# Patient Record
Sex: Male | Born: 1955
Health system: Southern US, Community
[De-identification: ages and names within clinical notes are randomized; demographics above are authoritative.]

## PROBLEM LIST (undated history)

## (undated) DIAGNOSIS — I10 Essential (primary) hypertension: Secondary | ICD-10-CM

## (undated) DIAGNOSIS — E119 Type 2 diabetes mellitus without complications: Secondary | ICD-10-CM

## (undated) DIAGNOSIS — R7989 Other specified abnormal findings of blood chemistry: Secondary | ICD-10-CM

## (undated) DIAGNOSIS — B029 Zoster without complications: Secondary | ICD-10-CM

## (undated) DIAGNOSIS — E039 Hypothyroidism, unspecified: Secondary | ICD-10-CM

## (undated) DIAGNOSIS — M1990 Unspecified osteoarthritis, unspecified site: Secondary | ICD-10-CM

## (undated) DIAGNOSIS — R55 Syncope and collapse: Secondary | ICD-10-CM

## (undated) DIAGNOSIS — K219 Gastro-esophageal reflux disease without esophagitis: Secondary | ICD-10-CM

## (undated) DIAGNOSIS — K759 Inflammatory liver disease, unspecified: Secondary | ICD-10-CM

## (undated) HISTORY — DX: Other specified abnormal findings of blood chemistry: R79.89

## (undated) HISTORY — PX: WISDOM TOOTH EXTRACTION: SHX21

## (undated) HISTORY — DX: Zoster without complications: B02.9

## (undated) HISTORY — PX: JOINT REPLACEMENT: SHX530

---
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
## 1996-06-06 HISTORY — PX: CERVICAL DISC SURGERY: SHX588

## 2005-06-06 DIAGNOSIS — K5790 Diverticulosis of intestine, part unspecified, without perforation or abscess without bleeding: Secondary | ICD-10-CM

## 2005-06-06 HISTORY — DX: Diverticulosis of intestine, part unspecified, without perforation or abscess without bleeding: K57.90

## 2005-09-02 ENCOUNTER — Ambulatory Visit: Payer: Self-pay | Admitting: Internal Medicine

## 2005-09-07 ENCOUNTER — Ambulatory Visit: Payer: Self-pay | Admitting: Family Medicine

## 2005-09-08 ENCOUNTER — Encounter (INDEPENDENT_AMBULATORY_CARE_PROVIDER_SITE_OTHER): Payer: Self-pay | Admitting: Specialist

## 2005-09-08 ENCOUNTER — Ambulatory Visit: Payer: Self-pay | Admitting: Internal Medicine

## 2005-09-16 ENCOUNTER — Ambulatory Visit: Payer: Self-pay | Admitting: Family Medicine

## 2005-12-23 ENCOUNTER — Ambulatory Visit: Payer: Self-pay | Admitting: Family Medicine

## 2005-12-23 ENCOUNTER — Encounter: Admission: RE | Admit: 2005-12-23 | Discharge: 2005-12-23 | Payer: Self-pay | Admitting: Family Medicine

## 2005-12-27 ENCOUNTER — Ambulatory Visit: Payer: Self-pay | Admitting: *Deleted

## 2008-07-29 ENCOUNTER — Telehealth: Payer: Self-pay | Admitting: Family Medicine

## 2008-10-27 ENCOUNTER — Ambulatory Visit: Payer: Self-pay | Admitting: Family Medicine

## 2008-10-27 DIAGNOSIS — M6580 Other synovitis and tenosynovitis, unspecified site: Secondary | ICD-10-CM

## 2008-11-28 ENCOUNTER — Ambulatory Visit: Payer: Self-pay | Admitting: Family Medicine

## 2008-11-28 LAB — CONVERTED CEMR LAB
ALT: 38 units/L (ref 0–53)
AST: 28 units/L (ref 0–37)
Albumin: 4 g/dL (ref 3.5–5.2)
Alkaline Phosphatase: 69 units/L (ref 39–117)
BUN: 14 mg/dL (ref 6–23)
Basophils Absolute: 0 10*3/uL (ref 0.0–0.1)
Cholesterol: 144 mg/dL (ref 0–200)
Creatinine, Ser: 0.9 mg/dL (ref 0.4–1.5)
Eosinophils Absolute: 0.3 10*3/uL (ref 0.0–0.7)
Eosinophils Relative: 2.7 % (ref 0.0–5.0)
GFR calc non Af Amer: 93.28 mL/min (ref >60)
Glucose, Bld: 106 mg/dL — ABNORMAL HIGH (ref 70–99)
HCT: 46.5 % (ref 39.0–52.0)
Lymphs Abs: 2.6 10*3/uL (ref 0.7–4.0)
MCV: 87.8 fL (ref 78.0–100.0)
Monocytes Relative: 7.1 % (ref 3.0–12.0)
Neutrophils Relative %: 62 % (ref 43.0–77.0)
PSA: 1.08 ng/mL (ref 0.10–4.00)
Potassium: 4.6 meq/L (ref 3.5–5.1)
Sodium: 141 meq/L (ref 135–145)
Specific Gravity, Urine: 1.02
Total Bilirubin: 1 mg/dL (ref 0.3–1.2)
Total CHOL/HDL Ratio: 5
Total Protein: 6.6 g/dL (ref 6.0–8.3)
Urobilinogen, UA: 0.2
WBC: 9.3 10*3/uL (ref 4.5–10.5)
pH: 7.5

## 2008-12-03 ENCOUNTER — Ambulatory Visit: Payer: Self-pay | Admitting: Family Medicine

## 2008-12-03 DIAGNOSIS — F17200 Nicotine dependence, unspecified, uncomplicated: Secondary | ICD-10-CM

## 2008-12-03 DIAGNOSIS — K210 Gastro-esophageal reflux disease with esophagitis: Secondary | ICD-10-CM

## 2008-12-17 ENCOUNTER — Telehealth: Payer: Self-pay | Admitting: Family Medicine

## 2009-06-06 DIAGNOSIS — L309 Dermatitis, unspecified: Secondary | ICD-10-CM

## 2009-06-06 DIAGNOSIS — L729 Follicular cyst of the skin and subcutaneous tissue, unspecified: Secondary | ICD-10-CM

## 2009-06-06 HISTORY — DX: Dermatitis, unspecified: L30.9

## 2009-06-06 HISTORY — DX: Follicular cyst of the skin and subcutaneous tissue, unspecified: L72.9

## 2009-11-27 ENCOUNTER — Ambulatory Visit: Payer: Self-pay | Admitting: Family Medicine

## 2009-11-27 DIAGNOSIS — L2089 Other atopic dermatitis: Secondary | ICD-10-CM

## 2009-12-11 ENCOUNTER — Ambulatory Visit: Payer: Self-pay | Admitting: Family Medicine

## 2009-12-11 LAB — CONVERTED CEMR LAB
ALT: 30 units/L (ref 0–53)
Alkaline Phosphatase: 60 units/L (ref 39–117)
BUN: 18 mg/dL (ref 6–23)
Basophils Relative: 0.4 % (ref 0.0–3.0)
Bilirubin, Direct: 0.2 mg/dL (ref 0.0–0.3)
Blood in Urine, dipstick: NEGATIVE
CO2: 26 meq/L (ref 19–32)
Chloride: 111 meq/L (ref 96–112)
Cholesterol: 169 mg/dL (ref 0–200)
Eosinophils Relative: 2.9 % (ref 0.0–5.0)
Glucose, Urine, Semiquant: NEGATIVE
HCT: 49.6 % (ref 39.0–52.0)
HDL: 32.8 mg/dL — ABNORMAL LOW (ref >39.00)
Ketones, urine, test strip: NEGATIVE
Lymphs Abs: 3.1 10*3/uL (ref 0.7–4.0)
MCHC: 34.2 g/dL (ref 30.0–36.0)
MCV: 90.8 fL (ref 78.0–100.0)
Monocytes Absolute: 0.8 10*3/uL (ref 0.1–1.0)
Neutro Abs: 6.5 10*3/uL (ref 1.4–7.7)
Nitrite: NEGATIVE
PSA: 0.97 ng/mL (ref 0.10–4.00)
Sodium: 143 meq/L (ref 135–145)
TSH: 4.41 microintl units/mL (ref 0.35–5.50)
Total Protein: 7.2 g/dL (ref 6.0–8.3)
Urobilinogen, UA: 0.2
VLDL: 32.2 mg/dL (ref 0.0–40.0)
WBC: 10.7 10*3/uL — ABNORMAL HIGH (ref 4.5–10.5)
pH: 5.5

## 2009-12-18 ENCOUNTER — Ambulatory Visit: Payer: Self-pay | Admitting: Family Medicine

## 2009-12-24 ENCOUNTER — Ambulatory Visit: Payer: Self-pay | Admitting: Family Medicine

## 2009-12-25 DIAGNOSIS — B079 Viral wart, unspecified: Secondary | ICD-10-CM

## 2010-05-10 ENCOUNTER — Ambulatory Visit: Payer: Self-pay | Admitting: Family Medicine

## 2010-05-10 DIAGNOSIS — L723 Sebaceous cyst: Secondary | ICD-10-CM

## 2010-07-06 NOTE — Assessment & Plan Note (Signed)
Summary: EVAL OF GROWTH ON BACK // RS   Vital Signs:  Patient profile:   55 year old male Weight:      238 pounds Temp:     97.6 degrees F oral BP sitting:   110 / 80  (left arm) Cuff size:   regular  Vitals Entered By: Kern Reap CMA Duncan Dull) (May 10, 2010 3:38 PM) CC: lump on upper right back Is Patient Diabetic? No Pain Assessment Patient in pain? no        CC:  lump on upper right back.  History of Present Illness: Hadi is a 55 year old male, who comes in today for evaluation of a lump on his back.  He himself has never noticed this pain.  He recently had a massage, and the massage therapist noted.he himself is asymptomatic  Allergies: 1)  ! Codeine  Past History:  Past medical, surgical, family and social histories (including risk factors) reviewed for relevance to current acute and chronic problems.  Past Medical History: Reviewed history from 01/22/2007 and no changes required. Hepatitis A  Past Surgical History: Reviewed history from 01/22/2007 and no changes required. T&A Appendectomy  Family History: Reviewed history from 01/22/2007 and no changes required. Family History Psychiatric care Family History of Cardiovascular disorder  Social History: Reviewed history from 12/03/2008 and no changes required. Occupation:elementary school teacher Married Alcohol use-yes Current Smoker  Review of Systems      See HPI  Physical Exam  General:  Well-developed,well-nourished,in no acute distress; alert,appropriate and cooperative throughout examination   Problems:  Medical Problems Added: 1)  Dx of Sebaceous Cyst  (ICD-706.2)  Impression & Recommendations:  Problem # 1:  SEBACEOUS CYST (ICD-706.2) Assessment New  Complete Medication List: 1)  Epipen 2-pak 0.3 Mg/0.53ml (1:1000) Devi (Epinephrine hcl (anaphylaxis)) .... Uad 2)  Nexium 20 Mg Cpdr (Esomeprazole magnesium) .... Take 1 tablet by mouth two times a day 3)  Chantix Starting  Month Pak 0.5 Mg X 11 & 1 Mg X 42 Tabs (Varenicline tartrate) .... Uad 4)  Chantix Continuing Month Pak 1 Mg Tabs (Varenicline tartrate) .... Uad 5)  Triamcinolone Acetonide 0.5 % Oint (Triamcinolone acetonide) .... Apply 3 x day  Patient Instructions: 1)  return if he gets infected   Orders Added: 1)  Est. Patient Level III [81191]

## 2010-07-06 NOTE — Miscellaneous (Signed)
Summary: Consent for Mole Removal   Consent for Mole Removal   Imported By: Maryln Gottron 12/25/2009 13:34:44  _____________________________________________________________________  External Attachment:    Type:   Image     Comment:   External Document

## 2010-07-06 NOTE — Assessment & Plan Note (Signed)
Summary: lesion removal from face/njr   Procedure Note Last Tetanus: Historical (06/06/2000)  Mole Biopsy/Removal: Indication: inflamed lesion Consent signed: yes  Procedure # 1: elliptical incision with 2 mm margin    Size (in cm): 0.5 x 0.5    Region: anterior    Location: face    Instrument used: #15 blade    Anesthesia: 1% lidocaine w/epinephrine    Closure: cauterry  Cleaned and prepped with: alcohol Wound dressing: neosporin and bandaid   History of Present Illness: [name] is a 55 year old, married male, who comes in today for removal of irritated lesion on his left upper cheek  Allergies: 1)  ! Codeine   Complete Medication List: 1)  Epipen 2-pak 0.3 Mg/0.5ml (1:1000) Devi (Epinephrine hcl (anaphylaxis)) .... Uad 2)  Nexium 20 Mg Cpdr (Esomeprazole magnesium) .... Take 1 tablet by mouth two times a day 3)  Chantix Starting Month Pak 0.5 Mg X 11 & 1 Mg X 42 Tabs (Varenicline tartrate) .... Uad 4)  Chantix Continuing Month Pak 1 Mg Tabs (Varenicline tartrate) .... Uad 5)  Triamcinolone Acetonide 0.5 % Oint (Triamcinolone acetonide) .... Apply 3 x day  Other Orders: Shave Skin Lesion 0.6-1.0cm face/ears/eyelids/nose/lips/mm (11311)

## 2010-07-06 NOTE — Assessment & Plan Note (Signed)
Summary: RASH ON LOWER LEGS // RS   Vital Signs:  Patient profile:   55 year old male Weight:      232 pounds BMI:     34.89 Temp:     98.3 degrees F oral BP sitting:   120 / 80  (left arm) Cuff size:   regular  Vitals Entered By: Kern Reap CMA Duncan Dull) (November 27, 2009 12:10 PM) CC: rash on legs   CC:  rash on legs.  History of Present Illness: [name] is a 55 year old male, who comes in today for evaluation of a skin rash x 2 months.  He developed.  The lesions over both lower extremities two months ago.  He's been using an anti-fungal cream to no avail.  Allergies: 1)  ! Codeine  Physical Exam  General:  Well-developed,well-nourished,in no acute distress; alert,appropriate and cooperative throughout examination Skin:  lesions lower extremities, consistent with the psychotic eczema   Problems:  Medical Problems Added: 1)  Dx of Eczema, Atopic  (ICD-691.8)  Impression & Recommendations:  Problem # 1:  ECZEMA, ATOPIC (ICD-691.8) Assessment New  His updated medication list for this problem includes:    Triamcinolone Acetonide 0.5 % Oint (Triamcinolone acetonide) .Marland Kitchen... Apply 3 x day  Orders: Prescription Created Electronically [phone])  Complete Medication List: 1)  Epipen 2-pak 0.3 Mg/0.79ml (1:1000) Devi (Epinephrine hcl (anaphylaxis)) .... Uad 2)  Nexium 20 Mg Cpdr (Esomeprazole magnesium) .... Take 1 tablet by mouth two times a day 3)  Chantix Starting Month Pak 0.5 Mg X 11 & 1 Mg X 42 Tabs (Varenicline tartrate) .... Uad 4)  Chantix Continuing Month Pak 1 Mg Tabs (Varenicline tartrate) .... Uad 5)  Triamcinolone Acetonide 0.5 % Oint (Triamcinolone acetonide) .... Apply 3 x day  Patient Instructions: 1)  applying small amounts of triamcinolone ointment 3 times daily until rash clears Prescriptions: TRIAMCINOLONE ACETONIDE 0.5 % OINT (TRIAMCINOLONE ACETONIDE) apply 3 x day  #60 gr x 4   Entered and Authorized by:   Roderick Pee MD   Signed by:   Roderick Pee MD on 11/27/2009   Method used:   Electronically to        [facility]* (retail)       [address]       [place], [place]  [address]       Ph: [phone]       Fax: [phone]   RxID:   [id number]

## 2010-07-06 NOTE — Assessment & Plan Note (Signed)
Summary: cpx//ccm   Vital Signs:  Patient profile:   55 year old male Height:      68.25 inches Weight:      230 pounds Temp:     98.1 degrees F oral BP sitting:   110 / 80  (left arm) Cuff size:   regular  Vitals Entered By: Kern Reap CMA Duncan Dull) (December 18, 2009 10:39 AM) CC: cpx   CC:  cpx.  History of Present Illness: [name] is a 55 year old, married male, smoker, who comes in today for evaluation of smoking, reflux esophagitis, eczema.  Last year.  He agreed to try the smoking cessation program with the chantix however, he never got the medication filled.  Will refill medication and try again this year.  Yours keeps an EpiPen with him.  He's had a history of anaphylactic reaction from bee stings.  He states he keeps his EpiPen in the glove compartment of his car.  I explained and the heat will denature  the medication keep it with them not in the car  He also has severe eczema is currently using triamcinolone .53, times a day, it's about 95% improved on his lower extremity.  He also takes Nexium 20 mg b.i.d. for reflux.  Most of which I told him would probably go away to quit smoking...he also has bilateral hearing loss with hearing aids that he only wears in the classroom  He gets routine eye care.  Dental care.  Colonoscopy normal when he turned  48.  Tetanus 2002  Allergies: 1)  ! Codeine  Past History:  Past medical, surgical, family and social histories (including risk factors) reviewed, and no changes noted (except as noted below).  Past Medical History: Reviewed history from 01/22/2007 and no changes required. Hepatitis A  Past Surgical History: Reviewed history from 01/22/2007 and no changes required. T&A Appendectomy  Family History: Reviewed history from 01/22/2007 and no changes required. Family History Psychiatric care Family History of Cardiovascular disorder  Social History: Reviewed history from 12/03/2008 and no changes  required. Occupation:elementary school teacher Married Alcohol use-yes Current Smoker  Review of Systems      See HPI  Physical Exam  General:  Well-developed,well-nourished,in no acute distress; alert,appropriate and cooperative throughout examination Head:  Normocephalic and atraumatic without obvious abnormalities. No apparent alopecia or balding. Eyes:  No corneal or conjunctival inflammation noted. EOMI. Perrla. Funduscopic exam benign, without hemorrhages, exudates or papilledema. Vision grossly normal. Ears:  External ear exam shows no significant lesions or deformities.  Otoscopic examination reveals clear canals, tympanic membranes are intact bilaterally without bulging, retraction, inflammation or discharge. Hearing is grossly normal bilaterally. Nose:  External nasal examination shows no deformity or inflammation. Nasal mucosa are pink and moist without lesions or exudates. Mouth:  Oral mucosa and oropharynx without lesions or exudates.  Teeth in good repair. Neck:  No deformities, masses, or tenderness noted. Chest Wall:  No deformities, masses, tenderness or gynecomastia noted. Breasts:  No masses or gynecomastia noted Lungs:  Normal respiratory effort, chest expands symmetrically. Lungs are clear to auscultation, no crackles or wheezes. Heart:  Normal rate and regular rhythm. S1 and S2 normal without gallop, murmur, click, rub or other extra sounds. Abdomen:  Bowel sounds positive,abdomen soft and non-tender without masses, organomegaly or hernias noted. Rectal:  No external abnormalities noted. Normal sphincter tone. No rectal masses or tenderness. Genitalia:  Testes bilaterally descended without nodularity, tenderness or masses. No scrotal masses or lesions. No penis lesions or urethral discharge. Prostate:  Prostate  gland firm and smooth, no enlargement, nodularity, tenderness, mass, asymmetry or induration. Msk:  No deformity or scoliosis noted of thoracic or lumbar spine.    Pulses:  R and L carotid,radial,femoral,dorsalis pedis and posterior tibial pulses are full and equal bilaterally Extremities:  No clubbing, cyanosis, edema, or deformity noted with normal full range of motion of all joints.   Neurologic:  No cranial nerve deficits noted. Station and gait are normal. Plantar reflexes are down-going bilaterally. DTRs are symmetrical throughout. Sensory, motor and coordinative functions appear intact. Skin:  total body skin exam normal except for a lesion on his left face.  Return for removal Cervical Nodes:  No lymphadenopathy noted Axillary Nodes:  No palpable lymphadenopathy Inguinal Nodes:  No significant adenopathy Psych:  Cognition and judgment appear intact. Alert and cooperative with normal attention span and concentration. No apparent delusions, illusions, hallucinations   Impression & Recommendations:  Problem # 1:  Preventive Health Care (ICD-V70.0) Assessment Unchanged  Problem # 2:  ECZEMA, ATOPIC (ICD-691.8) Assessment: Improved  His updated medication list for this problem includes:    Triamcinolone Acetonide 0.5 % Oint (Triamcinolone acetonide) .Marland Kitchen... Apply 3 x day  Orders: Prescription Created Electronically [id number])  Problem # 3:  TOBACCO ABUSE (ICD-305.1) Assessment: Unchanged  His updated medication list for this problem includes:    Chantix Starting Month Pak 0.5 Mg X 11 & 1 Mg X 42 Tabs (Varenicline tartrate) ..... Uad    Chantix Continuing Month Pak 1 Mg Tabs (Varenicline tartrate) ..... Uad  Orders: Prescription Created Electronically [phone])  Problem # 4:  ESOPHAGITIS, REFLUX (ICD-530.11) Assessment: Improved  His updated medication list for this problem includes:    Nexium 20 Mg Cpdr (Esomeprazole magnesium) .Marland Kitchen... Take 1 tablet by mouth two times a day  Orders: Prescription Created Electronically [id number])  Complete Medication List: 1)  Epipen 2-pak 0.3 Mg/0.90ml (1:1000) Devi (Epinephrine hcl (anaphylaxis)) ....  Uad 2)  Nexium 20 Mg Cpdr (Esomeprazole magnesium) .... Take 1 tablet by mouth two times a day 3)  Chantix Starting Month Pak 0.5 Mg X 11 & 1 Mg X 42 Tabs (Varenicline tartrate) .... Uad 4)  Chantix Continuing Month Pak 1 Mg Tabs (Varenicline tartrate) .... Uad 5)  Triamcinolone Acetonide 0.5 % Oint (Triamcinolone acetonide) .... Apply 3 x day  Patient Instructions: 1)  begin the smoking cessation program with chantix when you get to the blue tablets, take a half a tablet twice daily instead of a full tablet twice daily.  Return in 4 weeks for follow-up.  Be sure to call the number on the information packet 2)  return next week to remove the lesion from your face Prescriptions: CHANTIX CONTINUING MONTH PAK 1 MG TABS (VARENICLINE TARTRATE) UAD  #1 x 3   Entered and Authorized by:   Roderick Pee MD   Signed by:   Roderick Pee MD on 12/18/2009   Method used:   Print then Give to Patient   RxID:   [id number] CHANTIX STARTING MONTH PAK 0.5 MG X 11 & 1 MG X 42 TABS (VARENICLINE TARTRATE) UAD  #1 x 0   Entered and Authorized by:   Roderick Pee MD   Signed by:   Roderick Pee MD on 12/18/2009   Method used:   Print then Give to Patient   RxID:   [id number] TRIAMCINOLONE ACETONIDE 0.5 % OINT (TRIAMCINOLONE ACETONIDE) apply 3 x day  #60 gr x 4   Entered and Authorized by:   Roderick Pee  MD   Signed by:   Roderick Pee MD on 12/18/2009   Method used:   Electronically to        [facility]  [address] [phone]* (retail)       [address]       [place], [place]  [address]       Ph: [phone] or [phone]       Fax: [phone]   RxID:   [id number] CHANTIX CONTINUING MONTH PAK 1 MG TABS (VARENICLINE TARTRATE) UAD  #1 x 3   Entered and Authorized by:   Roderick Pee MD   Signed by:   Roderick Pee MD on 12/18/2009   Method used:   Electronically to        [facility] [phone]* (retail)       [address]        [place], [place]  [address]       Ph: [phone] or [phone]       Fax: [phone]   RxID:   [id number] CHANTIX STARTING MONTH PAK 0.5 MG X 11 & 1 MG X 42 TABS (VARENICLINE TARTRATE) UAD  #1 x 0   Entered and Authorized by:   Roderick Pee MD   Signed by:   Roderick Pee MD on 12/18/2009   Method used:   Electronically to        [facility]  [address] [phone]* (retail)       [address]       [name], [place]  [address]       Ph: [phone] or [phone]       Fax: [phone]   RxID:   [id number] NEXIUM 20 MG CPDR (ESOMEPRAZOLE MAGNESIUM) Take 1 tablet by mouth two times a day  #200 x 3   Entered and Authorized by:   Roderick Pee MD   Signed by:   Roderick Pee MD on 12/18/2009   Method used:   Electronically to        [facility]  [address] [phone]* (retail)       [address]       [place], [place]  [address]       Ph: [phone] or [phone]       Fax: [phone]   RxID:   [id number] EPIPEN 2-PAK 0.3 MG/0.3ML (1:1000) DEVI (EPINEPHRINE HCL (ANAPHYLAXIS)) UAD  #1 x 2   Entered and Authorized by:   Roderick Pee MD   Signed by:   Roderick Pee MD on 12/18/2009   Method used:   [facility]ronically to        [facility]  [address] [phone]* (retail)       [address]       [place], [place]  [address]       Ph: [phone] or [phone]       Fax: [phone]   RxID:   [id number]     Appended Document: cpx//ccm     Allergies: 1)  ! Codeine   Complete Medication List: 1)  Epipen 2-pak 0.3 Mg/0.77ml (1:1000) Devi (Epinephrine hcl (anaphylaxis)) .... Uad 2)  Nexium 20 Mg Cpdr (Esomeprazole magnesium) .... Take 1 tablet by mouth two times a day 3)  Chantix Starting Month  Pak 0.5 Mg X 11 & 1 Mg X 42 Tabs (Varenicline tartrate) .... Uad 4)  Chantix Continuing Month Pak 1 Mg Tabs (Varenicline tartrate) .... Uad 5)  Triamcinolone Acetonide 0.5 % Oint (Triamcinolone acetonide) .... Apply 3 x  day  Other Orders: EKG w/ Interpretation (93000)

## 2010-11-16 ENCOUNTER — Ambulatory Visit (INDEPENDENT_AMBULATORY_CARE_PROVIDER_SITE_OTHER): Payer: BC Managed Care – PPO | Admitting: Internal Medicine

## 2010-11-16 ENCOUNTER — Encounter: Payer: Self-pay | Admitting: Internal Medicine

## 2010-11-16 ENCOUNTER — Ambulatory Visit (INDEPENDENT_AMBULATORY_CARE_PROVIDER_SITE_OTHER)
Admission: RE | Admit: 2010-11-16 | Discharge: 2010-11-16 | Disposition: A | Discharge status: Home / Self Care | Payer: BC Managed Care – PPO | Source: Ambulatory Visit | Attending: Internal Medicine | Admitting: Internal Medicine

## 2010-11-16 VITALS — BP 118/70 | HR 96 | Ht 68.0 in | Wt 240.0 lb

## 2010-11-16 DIAGNOSIS — R109 Unspecified abdominal pain: Secondary | ICD-10-CM

## 2010-11-16 LAB — POCT URINALYSIS DIPSTICK
Blood, UA: NEGATIVE
Leukocytes, UA: NEGATIVE
Nitrite, UA: NEGATIVE
Spec Grav, UA: 1.025
pH, UA: 5

## 2010-11-16 LAB — CBC WITH DIFFERENTIAL/PLATELET
Basophils Relative: 0.2 % (ref 0.0–3.0)
Eosinophils Absolute: 0.3 10*3/uL (ref 0.0–0.7)
HCT: 48.8 % (ref 39.0–52.0)
Hemoglobin: 16.7 g/dL (ref 13.0–17.0)
Lymphocytes Relative: 16.6 % (ref 12.0–46.0)
Monocytes Absolute: 1.1 10*3/uL — ABNORMAL HIGH (ref 0.1–1.0)
Monocytes Relative: 6.9 % (ref 3.0–12.0)
Neutro Abs: 12.2 10*3/uL — ABNORMAL HIGH (ref 1.4–7.7)
Neutrophils Relative %: 74.7 % (ref 43.0–77.0)
Platelets: 214 10*3/uL (ref 150.0–400.0)
RDW: 13.4 % (ref 11.5–14.6)

## 2010-11-16 MED ORDER — LEVOFLOXACIN 750 MG PO TABS: 750.0000 mg | ORAL_TABLET | Freq: Every day | ORAL | Status: DC

## 2010-11-16 NOTE — Progress Notes (Signed)
  Subjective:    Patient ID: [name], male    DOB: 1955/09/16, 55 y.o.   MRN: [id number]  HPI Pt presents to clinic for evaluation of abdominal pain. Notes 6d h/o intermittent lower abd pain without radiation. Pain is intermittently severe and may last up to 20 mins followed by spontaneous resolution. Describes pain sometimes as crampy sensation associated with gas. No fever/chills, n/v or blood in stool. Bowel movements may be slightly more loose. No obvious exacerbating or alleviating factors. Colonoscopy reportedly nl 2005 but without report cannot excluded diverticuli. No other complaints.  Reviewed pmh, medications and allergies.    Review of Systems  Constitutional: Negative for fever, chills and diaphoresis.  Gastrointestinal: Positive for abdominal pain. Negative for nausea, vomiting, diarrhea, constipation, blood in stool, abdominal distention and rectal pain.  Genitourinary: Negative for dysuria, hematuria and difficulty urinating.  Skin: Negative for color change and rash.       Objective:   Physical Exam  Nursing note and vitals reviewed. Constitutional: He appears well-developed and well-nourished. No distress.  HENT:  Head: Normocephalic and atraumatic.  Right Ear: External ear normal.  Left Ear: External ear normal.  Nose: Nose normal.  Mouth/Throat: No oropharyngeal exudate.  Eyes: Conjunctivae are normal. No scleral icterus.  Neck: Neck supple.  Abdominal: Soft. Bowel sounds are normal. He exhibits no distension and no mass. There is no hepatosplenomegaly. There is tenderness in the suprapubic area and left lower quadrant. There is no rigidity, no rebound and no guarding.  Neurological: He is alert.  Skin: Skin is warm and dry. No rash noted. He is not diaphoretic. No erythema.  Psychiatric: He has a normal mood and affect.          Assessment & Plan:

## 2010-11-16 NOTE — Assessment & Plan Note (Signed)
Obtain CBC, UA and abdominal xray. Begin empiric abx tx for possible diverticulitis. Close followup if no improvement or worsening.

## 2010-11-17 ENCOUNTER — Telehealth: Payer: Self-pay

## 2010-11-17 NOTE — Telephone Encounter (Signed)
Pt notified and appointment scheduled.

## 2010-11-17 NOTE — Telephone Encounter (Signed)
Message copied by Beverely Low on Wed Nov 17, 2010  3:20 PM ------      Message from: Staci Righter      Created: Tue Nov 16, 2010  9:16 PM       abd xray ok. CBC shows elevated WBC. Make sure taking abx and pls schedule close followup with me 2-3 days

## 2010-11-17 NOTE — Telephone Encounter (Signed)
Left message for pt to call back  °

## 2010-11-17 NOTE — Telephone Encounter (Signed)
Message copied by Beverely Low on Wed Nov 17, 2010  2:35 PM ------      Message from: Staci Righter      Created: Tue Nov 16, 2010  9:16 PM       abd xray ok. CBC shows elevated WBC. Make sure taking abx and pls schedule close followup with me 2-3 days

## 2010-11-19 ENCOUNTER — Encounter: Payer: Self-pay | Admitting: Internal Medicine

## 2010-11-19 ENCOUNTER — Ambulatory Visit (INDEPENDENT_AMBULATORY_CARE_PROVIDER_SITE_OTHER): Payer: BC Managed Care – PPO | Admitting: Internal Medicine

## 2010-11-19 VITALS — BP 120/74 | HR 96

## 2010-11-19 DIAGNOSIS — D72829 Elevated white blood cell count, unspecified: Secondary | ICD-10-CM

## 2010-11-19 DIAGNOSIS — R21 Rash and other nonspecific skin eruption: Secondary | ICD-10-CM

## 2010-11-19 DIAGNOSIS — R109 Unspecified abdominal pain: Secondary | ICD-10-CM

## 2010-11-19 LAB — CBC WITH DIFFERENTIAL/PLATELET
Basophils Absolute: 0 10*3/uL (ref 0.0–0.1)
Basophils Relative: 0.3 % (ref 0.0–3.0)
Eosinophils Absolute: 0.3 10*3/uL (ref 0.0–0.7)
MCHC: 34.2 g/dL (ref 30.0–36.0)
MCV: 88.5 fl (ref 78.0–100.0)
Monocytes Relative: 7.6 % (ref 3.0–12.0)

## 2010-11-19 MED ORDER — LEVOFLOXACIN 750 MG PO TABS: 750.0000 mg | ORAL_TABLET | Freq: Every day | ORAL | Status: AC

## 2010-11-19 MED ORDER — FLUOCINONIDE 0.05 % EX CREA
TOPICAL_CREAM | CUTANEOUS | Status: AC
Start: 2010-11-19 — End: 2011-11-19

## 2010-11-19 NOTE — Assessment & Plan Note (Signed)
Improving with empiric antibiotic therapy. Associated leukocytosis. Consider diverticulitis. Extend course of antibiotic times additional five days. Repeat CBC. Follow closely if no improvement or worsening.

## 2010-11-19 NOTE — Assessment & Plan Note (Signed)
Change triamcinolone to Lidex. Consider dermatology consult if remains refractory to treatment

## 2010-11-19 NOTE — Progress Notes (Signed)
  Subjective:    Patient ID: [name], male    DOB: [birth year], 55 y.o.   MRN: [id number]  HPI Pt presents to clinic for followup of abdominal pain and leukocytosis. Recently seen with predominantly left lower quadrant abdominal pain and tenderness. Placed on empiric Levaquin and tolerating without side effects. Feels pain is approximately 80% better. Denies fever chills nausea vomiting or diarrhea. Reviewed recent CBC with leukocytosis of sixteen thousand with neutrophil predominance. Also notes chronic one-year history of left calf ? Eczema unsuccessfully treated with triamcinolone once a day. No alleviating or exacerbating factors. Area is not spreading. No other complaints.  Reviewed past medical history, medications and allergies.  Review of Systems see history of present illness     Objective:   Physical Exam  Nursing note and vitals reviewed. Constitutional: He appears well-developed and well-nourished. No distress.  HENT:  Head: Normocephalic and atraumatic.  Right Ear: External ear normal.  Left Ear: External ear normal.  Nose: Nose normal.  Eyes: Conjunctivae are normal. No scleral icterus.  Abdominal: Soft. Bowel sounds are normal. He exhibits no distension and no mass. There is tenderness. There is no rebound and no guarding.       Minimal tenderness left lower quadrant. Much improved. No rebound guarding or rigidity. Positive bowel sounds with no mass.  Skin: Skin is warm and dry. Rash noted. He is not diaphoretic.       Left calf with mild erythematous raised circular ? rash  Psychiatric: He has a normal mood and affect.          Assessment & Plan:

## 2010-11-22 ENCOUNTER — Telehealth: Payer: Self-pay

## 2010-11-22 NOTE — Telephone Encounter (Signed)
Message copied by Beverely Low on Mon Nov 22, 2010  4:23 PM ------      Message from: Staci Righter      Created: Sun Nov 21, 2010 11:49 AM       Wbc improving. Continue abx

## 2010-11-22 NOTE — Telephone Encounter (Signed)
Message copied by Beverely Low on Mon Nov 22, 2010  3:27 PM ------      Message from: Staci Righter      Created: Sun Nov 21, 2010 11:49 AM       Wbc improving. Continue abx

## 2010-11-22 NOTE — Telephone Encounter (Signed)
Pt.notified

## 2010-11-22 NOTE — Telephone Encounter (Signed)
Left message for pt to call back  °

## 2011-03-06 ENCOUNTER — Other Ambulatory Visit: Payer: Self-pay | Admitting: Family Medicine

## 2011-06-07 HISTORY — DX: Gilbert syndrome: E80.4

## 2011-11-30 ENCOUNTER — Other Ambulatory Visit: Payer: Self-pay | Admitting: Family Medicine

## 2012-03-27 ENCOUNTER — Encounter: Payer: Self-pay | Admitting: Family Medicine

## 2012-03-27 ENCOUNTER — Ambulatory Visit (INDEPENDENT_AMBULATORY_CARE_PROVIDER_SITE_OTHER): Payer: BC Managed Care – PPO | Admitting: Family Medicine

## 2012-03-27 VITALS — BP 110/80 | Temp 98.2°F | Wt 213.0 lb

## 2012-03-27 DIAGNOSIS — IMO0001 Reserved for inherently not codable concepts without codable children: Secondary | ICD-10-CM

## 2012-03-27 DIAGNOSIS — IMO0002 Reserved for concepts with insufficient information to code with codable children: Secondary | ICD-10-CM

## 2012-03-27 DIAGNOSIS — R358 Other polyuria: Secondary | ICD-10-CM

## 2012-03-27 DIAGNOSIS — E1165 Type 2 diabetes mellitus with hyperglycemia: Secondary | ICD-10-CM

## 2012-03-27 DIAGNOSIS — R3589 Other polyuria: Secondary | ICD-10-CM

## 2012-03-27 LAB — POCT URINALYSIS DIPSTICK
Nitrite, UA: NEGATIVE
Urobilinogen, UA: 0.2
pH, UA: 5

## 2012-03-27 MED ORDER — METFORMIN HCL 500 MG PO TABS: 500.0000 mg | ORAL_TABLET | Freq: Two times a day (BID) | ORAL | Status: DC

## 2012-03-27 NOTE — Patient Instructions (Signed)
Stay on a sugar free diet  Metformin 500 mg one tablet he for your noon meal  Fasting blood sugar daily in the morning  Walk 30 minutes daily  Drink 24 ounces of water daily  Followup in one week

## 2012-03-27 NOTE — Progress Notes (Signed)
  Subjective:    Patient ID: [name], male    DOB: 06/15/1955, 56 y.o.   MRN: [id number]  HPI [name] is a 56 year old married male who comes in today with a 6 week history of increased urination nocturia increased thirst urinary difficulties and a 25 pound weight loss  His father was type II diabetic   Review of Systems    general and metabolic review of systems otherwise negative Objective:   Physical Exam  Well-developed well-nourished male in no acute distress random blood sugar 260      Assessment & Plan:  New onset of diabetes type 2 plan discussed diet exercise fluid intake metformin 500 mg daily before noon

## 2012-03-28 LAB — BASIC METABOLIC PANEL
BUN: 15 mg/dL (ref 6–23)
CO2: 28 mEq/L (ref 19–32)
Chloride: 98 mEq/L (ref 96–112)
Creatinine, Ser: 1 mg/dL (ref 0.4–1.5)
GFR: 86.59 mL/min (ref >60.00)

## 2012-03-28 LAB — LIPID PANEL
Cholesterol: 206 mg/dL — ABNORMAL HIGH (ref 0–200)
HDL: 33.9 mg/dL — ABNORMAL LOW (ref >39.00)
Triglycerides: 170 mg/dL — ABNORMAL HIGH (ref 0.0–149.0)
VLDL: 34 mg/dL (ref 0.0–40.0)

## 2012-03-28 LAB — CBC WITH DIFFERENTIAL/PLATELET
Basophils Relative: 0.6 % (ref 0.0–3.0)
Eosinophils Relative: 1 % (ref 0.0–5.0)
HCT: 52.7 % — ABNORMAL HIGH (ref 39.0–52.0)
Hemoglobin: 17.6 g/dL — ABNORMAL HIGH (ref 13.0–17.0)
MCV: 88.8 fl (ref 78.0–100.0)
Monocytes Absolute: 0.9 10*3/uL (ref 0.1–1.0)
Neutrophils Relative %: 62 % (ref 43.0–77.0)
RDW: 13.1 % (ref 11.5–14.6)
WBC: 10.6 10*3/uL — ABNORMAL HIGH (ref 4.5–10.5)

## 2012-03-28 LAB — HEPATIC FUNCTION PANEL
Alkaline Phosphatase: 69 U/L (ref 39–117)
Bilirubin, Direct: 0.4 mg/dL — ABNORMAL HIGH (ref 0.0–0.3)
Total Bilirubin: 1.9 mg/dL — ABNORMAL HIGH (ref 0.3–1.2)
Total Protein: 7.5 g/dL (ref 6.0–8.3)

## 2012-03-28 LAB — MICROALBUMIN / CREATININE URINE RATIO: Microalb Creat Ratio: 5.3 mg/g (ref 0.0–30.0)

## 2012-03-28 LAB — LDL CHOLESTEROL, DIRECT: Direct LDL: 134.2 mg/dL

## 2012-03-30 LAB — PSA: PSA: 0.81 ng/mL (ref 0.10–4.00)

## 2012-04-02 ENCOUNTER — Ambulatory Visit: Payer: BC Managed Care – PPO | Admitting: Family Medicine

## 2012-04-03 ENCOUNTER — Ambulatory Visit (INDEPENDENT_AMBULATORY_CARE_PROVIDER_SITE_OTHER): Payer: BC Managed Care – PPO | Admitting: Family Medicine

## 2012-04-03 ENCOUNTER — Encounter: Payer: Self-pay | Admitting: Family Medicine

## 2012-04-03 VITALS — BP 110/78 | Temp 97.9°F | Wt 215.0 lb

## 2012-04-03 DIAGNOSIS — E1165 Type 2 diabetes mellitus with hyperglycemia: Secondary | ICD-10-CM

## 2012-04-03 NOTE — Progress Notes (Signed)
  Subjective:    Patient ID: [name], male    DOB: 1955/07/06, 56 y.o.   MRN: [id number]  HPI [name] is a 56 year old male who comes in today for followup of new-onset diabetes  His hemoglobin A1c was 13. Blood pressure normal 110/78 and despite his blood sugar being fairly high over 250 his lipids were fairly normal. He also has an elevated bilirubin consistent with mild Guilbert's syndrome Blood sugars at home are coming down this morning fasting was 178  Review of Systems    general and metabolic review of systems otherwise negative Objective:   Physical Exam Well-developed well-nourished male in no acute distress       Assessment & Plan:  Diabetes type 2 increase metformin to 1000 mg before breakfast continue the 500 mg dose prior to evening meal followup in

## 2012-04-03 NOTE — Patient Instructions (Addendum)
Increasing metformin,,,,,,,, take 1000 mg prior to breakfast and continue the 500 mg dose prior to your evening meal  Ate 3 meals daily  Continue to drink lots of water and exercise on a daily basis  Fasting blood sugar daily in the morning,,,,,,,,, goal 100,,,,,,,,,,, 120 range  Followup November 7

## 2012-04-12 ENCOUNTER — Encounter: Payer: Self-pay | Admitting: Family Medicine

## 2012-04-12 ENCOUNTER — Ambulatory Visit (INDEPENDENT_AMBULATORY_CARE_PROVIDER_SITE_OTHER): Payer: BC Managed Care – PPO | Admitting: Family Medicine

## 2012-04-12 VITALS — BP 120/80 | Temp 98.2°F | Wt 215.0 lb

## 2012-04-12 DIAGNOSIS — E1165 Type 2 diabetes mellitus with hyperglycemia: Secondary | ICD-10-CM

## 2012-04-12 MED ORDER — METFORMIN HCL 1000 MG PO TABS: ORAL_TABLET | ORAL | Status: DC

## 2012-04-12 NOTE — Patient Instructions (Signed)
Increase the metformin to 1000 mg twice a day  Followup in 2 weeks

## 2012-04-12 NOTE — Progress Notes (Signed)
  Subjective:    Patient ID: [name], male    DOB: [birth year], 56 y.o.   MRN: [id number]  HPI [name] is a 56 year old married male nonsmoker who comes in today for followup of diabetes  He's on metformin 1000 mg before breakfast and 500 mg prior to his evening meal. He evening blood sugars are in the 100-110 range morning blood sugars in the 140-150 range. He's working hard on his diet and going to the gym daily   Review of Systems No hypoglycemia    Objective:   Physical Exam  Well-developed well-nourished man no acute distress      Assessment & Plan:  Diabetes type 2 approach and goal plan continue diet exercise medication but increase the metformin to 1000 mg twice a day followup in 2 weeks

## 2012-04-17 ENCOUNTER — Telehealth: Payer: Self-pay | Admitting: Family Medicine

## 2012-04-17 NOTE — Telephone Encounter (Signed)
Left message on machine for patient that a One Touch glucometer is available.

## 2012-04-17 NOTE — Telephone Encounter (Signed)
Pt called re: glucose meter. His insurance will cover One Touch and Accu-check. Pt wants to know if Dr Tawanna Cooler has either one of these meters to give to patient for free? If so, pt will need a script for the test strips that go with that meter and anything else that he will need for testing, called in to [facility] in [place]. Pt would like a call back from nurse.

## 2012-04-19 NOTE — Telephone Encounter (Signed)
Left message on machine for patient

## 2012-04-20 ENCOUNTER — Other Ambulatory Visit: Payer: Self-pay | Admitting: Family Medicine

## 2012-04-20 MED ORDER — GLUCOSE BLOOD VI STRP: ORAL_STRIP | Status: DC

## 2012-04-20 NOTE — Telephone Encounter (Signed)
Spoke with wife

## 2012-04-26 ENCOUNTER — Ambulatory Visit (INDEPENDENT_AMBULATORY_CARE_PROVIDER_SITE_OTHER): Payer: BC Managed Care – PPO | Admitting: Family Medicine

## 2012-04-26 ENCOUNTER — Encounter: Payer: Self-pay | Admitting: Family Medicine

## 2012-04-26 VITALS — BP 110/70 | Temp 98.0°F | Wt 211.0 lb

## 2012-04-26 DIAGNOSIS — E1165 Type 2 diabetes mellitus with hyperglycemia: Secondary | ICD-10-CM

## 2012-04-26 NOTE — Progress Notes (Signed)
  Subjective:    Patient ID: [name], male    DOB: [birth year], 56 y.o.   MRN: [id number]  HPI [name] is a 56 year old male who comes in today for followup of diabetes type 2  On 1000 mg of metformin twice a day his fasting sugars have dropped to normal. In the last week he had one which was 59 asymptomatic   Review of Systems    general and metabolic review of systems otherwise negative Objective:   Physical Exam  Well-developed well-nourished male in no acute distress      Assessment & Plan:  Diabetes type 2 at goal continue diet exercise and medication  Fasting blood sugar daily in the morning followup in 2 months

## 2012-04-26 NOTE — Patient Instructions (Signed)
Continue the metformin 1000 mg twice daily  Check a fasting blood sugar once daily in the morning  Return the fourth week in January for followup non-fasting labs one week prior  If you see your blood sugar consistently below 90 then decrease the metformin by 25%  Concomitantly if you see your blood sugar going up then call so we can readjust your medications  Excellent work all see you the fourth weekend January

## 2012-06-25 ENCOUNTER — Other Ambulatory Visit (INDEPENDENT_AMBULATORY_CARE_PROVIDER_SITE_OTHER): Payer: BC Managed Care – PPO

## 2012-06-25 DIAGNOSIS — IMO0002 Reserved for concepts with insufficient information to code with codable children: Secondary | ICD-10-CM

## 2012-06-25 DIAGNOSIS — E1165 Type 2 diabetes mellitus with hyperglycemia: Secondary | ICD-10-CM

## 2012-06-25 DIAGNOSIS — IMO0001 Reserved for inherently not codable concepts without codable children: Secondary | ICD-10-CM

## 2012-06-25 LAB — HEMOGLOBIN A1C: Hgb A1c MFr Bld: 6.8 % — ABNORMAL HIGH (ref 4.6–6.5)

## 2012-06-25 LAB — BASIC METABOLIC PANEL
Chloride: 103 mEq/L (ref 96–112)
Creatinine, Ser: 0.9 mg/dL (ref 0.4–1.5)
Glucose, Bld: 125 mg/dL — ABNORMAL HIGH (ref 70–99)
Potassium: 4.3 mEq/L (ref 3.5–5.1)

## 2012-06-25 LAB — MICROALBUMIN / CREATININE URINE RATIO
Creatinine,U: 133.8 mg/dL
Microalb Creat Ratio: 1 mg/g (ref 0.0–30.0)

## 2012-06-25 LAB — LIPID PANEL
Triglycerides: 107 mg/dL (ref 0.0–149.0)
VLDL: 21.4 mg/dL (ref 0.0–40.0)

## 2012-07-02 ENCOUNTER — Encounter: Payer: Self-pay | Admitting: Family Medicine

## 2012-07-02 ENCOUNTER — Ambulatory Visit (INDEPENDENT_AMBULATORY_CARE_PROVIDER_SITE_OTHER): Payer: BC Managed Care – PPO | Admitting: Family Medicine

## 2012-07-02 VITALS — BP 120/80 | Temp 97.6°F | Wt 206.0 lb

## 2012-07-02 DIAGNOSIS — IMO0001 Reserved for inherently not codable concepts without codable children: Secondary | ICD-10-CM

## 2012-07-02 DIAGNOSIS — IMO0002 Reserved for concepts with insufficient information to code with codable children: Secondary | ICD-10-CM

## 2012-07-02 DIAGNOSIS — E1165 Type 2 diabetes mellitus with hyperglycemia: Secondary | ICD-10-CM

## 2012-07-02 NOTE — Progress Notes (Signed)
  Subjective:    Patient ID: [name], male    DOB: 1956/05/03, 57 y.o.   MRN: [id number]  HPI [name] is a 57 year old male who comes in today for followup of diabetes type 2  He's currently on metformin 1000 mg twice a day fasting blood sugars are between 90 and 120. A1c dropped now to 6.8% no hypoglycemia   Review of Systems    review of systems negative except he continues to exercise he's down 6 pounds from his last visit Objective:   Physical Exam  Well-developed and nourished male no acute distress      Assessment & Plan:  Diabetes type 2 at goal continue current therapy followup in 3 months

## 2012-07-02 NOTE — Patient Instructions (Signed)
Continue your current treatment program  Followup in 3 months  Nonfasting labs one week prior

## 2012-07-21 ENCOUNTER — Other Ambulatory Visit: Payer: Self-pay

## 2012-10-03 ENCOUNTER — Other Ambulatory Visit (INDEPENDENT_AMBULATORY_CARE_PROVIDER_SITE_OTHER): Payer: BC Managed Care – PPO

## 2012-10-03 DIAGNOSIS — E1165 Type 2 diabetes mellitus with hyperglycemia: Secondary | ICD-10-CM

## 2012-10-04 LAB — BASIC METABOLIC PANEL
BUN: 18 mg/dL (ref 6–23)
Calcium: 9.2 mg/dL (ref 8.4–10.5)
Creatinine, Ser: 0.8 mg/dL (ref 0.4–1.5)
GFR: 111.81 mL/min (ref >60.00)
Glucose, Bld: 88 mg/dL (ref 70–99)
Potassium: 3.9 mEq/L (ref 3.5–5.1)

## 2012-10-11 ENCOUNTER — Ambulatory Visit: Payer: BC Managed Care – PPO | Admitting: Family Medicine

## 2012-11-01 ENCOUNTER — Ambulatory Visit (INDEPENDENT_AMBULATORY_CARE_PROVIDER_SITE_OTHER): Payer: BC Managed Care – PPO | Admitting: Family Medicine

## 2012-11-01 ENCOUNTER — Encounter: Payer: Self-pay | Admitting: Family Medicine

## 2012-11-01 VITALS — BP 110/80 | Temp 98.3°F | Wt 212.0 lb

## 2012-11-01 DIAGNOSIS — E119 Type 2 diabetes mellitus without complications: Secondary | ICD-10-CM | POA: Insufficient documentation

## 2012-11-01 MED ORDER — METFORMIN HCL 500 MG PO TABS: 500.0000 mg | ORAL_TABLET | Freq: Two times a day (BID) | ORAL | Status: DC

## 2012-11-01 NOTE — Progress Notes (Signed)
  Subjective:    Patient ID: [name], male    DOB: 1956-05-19, 57 y.o.   MRN: [id number]  HPI Tanav is a 57 year old married male ex-smoker,,,,,,,, x4 months plus,,,,,,, who comes in today for followup of diabetes  He's been extremely compliant with his diet and exercise and medications. His A1c is back to 6.1. Random fasting blood sugar 88 which is consistent with the sugars that he is getting at home. No hypoglycemia. He did decrease his metformin because his blood sugar was dropping too low. She's currently on 1000 mg in the morning 500 prior to his evening meal.   Review of Systems    review of systems negative,,,,,,he walks about 15 miles per week Objective:   Physical Exam Well-developed well-nourished male in no acute distress vital signs stable BP 110/80       Assessment & Plan:  Diabetes type 2 much improved control decrease metformin to 500 twice a day followup in 6 months

## 2012-11-01 NOTE — Patient Instructions (Signed)
Decrease the metformin to 500 mg twice daily  If you have  Consistently low blood sugars then decrease the evening dose to 250 mg  Return in November for your annual exam sooner if any problems

## 2012-12-19 ENCOUNTER — Other Ambulatory Visit: Payer: Self-pay | Admitting: Orthopaedic Surgery

## 2012-12-19 DIAGNOSIS — M25512 Pain in left shoulder: Secondary | ICD-10-CM

## 2012-12-26 ENCOUNTER — Ambulatory Visit
Admission: RE | Admit: 2012-12-26 | Discharge: 2012-12-26 | Disposition: A | Discharge status: Home / Self Care | Payer: BC Managed Care – PPO | Source: Ambulatory Visit | Attending: Orthopaedic Surgery | Admitting: Orthopaedic Surgery

## 2012-12-26 DIAGNOSIS — M25512 Pain in left shoulder: Secondary | ICD-10-CM

## 2013-04-11 ENCOUNTER — Other Ambulatory Visit: Payer: Self-pay

## 2013-05-06 ENCOUNTER — Ambulatory Visit: Payer: BC Managed Care – PPO | Admitting: Family Medicine

## 2013-05-22 ENCOUNTER — Other Ambulatory Visit: Payer: BC Managed Care – PPO

## 2013-05-28 ENCOUNTER — Encounter: Payer: BC Managed Care – PPO | Admitting: Family Medicine

## 2013-06-06 HISTORY — PX: SHOULDER ARTHROSCOPY W/ ROTATOR CUFF REPAIR: SHX2400

## 2013-07-22 ENCOUNTER — Encounter: Payer: BC Managed Care – PPO | Admitting: Family Medicine

## 2013-08-12 ENCOUNTER — Other Ambulatory Visit (INDEPENDENT_AMBULATORY_CARE_PROVIDER_SITE_OTHER): Payer: BC Managed Care – PPO

## 2013-08-12 ENCOUNTER — Other Ambulatory Visit: Payer: Self-pay | Admitting: *Deleted

## 2013-08-12 DIAGNOSIS — D72829 Elevated white blood cell count, unspecified: Secondary | ICD-10-CM

## 2013-08-12 DIAGNOSIS — Z0000 Encounter for general adult medical examination without abnormal findings: Secondary | ICD-10-CM

## 2013-08-12 LAB — POCT URINALYSIS DIPSTICK
BILIRUBIN UA: NEGATIVE
Blood, UA: NEGATIVE
Glucose, UA: NEGATIVE
KETONES UA: NEGATIVE
LEUKOCYTES UA: NEGATIVE
Nitrite, UA: NEGATIVE
PH UA: 6
Spec Grav, UA: 1.025
Urobilinogen, UA: 1

## 2013-08-12 LAB — CBC WITH DIFFERENTIAL/PLATELET
BASOS ABS: 0 10*3/uL (ref 0.0–0.1)
Basophils Relative: 0.1 % (ref 0.0–3.0)
Eosinophils Absolute: 0.3 10*3/uL (ref 0.0–0.7)
Eosinophils Relative: 1.6 % (ref 0.0–5.0)
HEMATOCRIT: 49.5 % (ref 39.0–52.0)
Hemoglobin: 17 g/dL (ref 13.0–17.0)
LYMPHS ABS: 2.9 10*3/uL (ref 0.7–4.0)
Lymphocytes Relative: 16.9 % (ref 12.0–46.0)
MCHC: 34.3 g/dL (ref 30.0–36.0)
MCV: 87.1 fl (ref 78.0–100.0)
MONO ABS: 1 10*3/uL (ref 0.1–1.0)
Monocytes Relative: 5.8 % (ref 3.0–12.0)
Neutro Abs: 13 10*3/uL — ABNORMAL HIGH (ref 1.4–7.7)
Neutrophils Relative %: 75.6 % (ref 43.0–77.0)
PLATELETS: 272 10*3/uL (ref 150.0–400.0)
RBC: 5.68 Mil/uL (ref 4.22–5.81)
RDW: 13.3 % (ref 11.5–14.6)
WBC: 17.2 10*3/uL — ABNORMAL HIGH (ref 4.5–10.5)

## 2013-08-12 LAB — BASIC METABOLIC PANEL
BUN: 14 mg/dL (ref 6–23)
CALCIUM: 9.5 mg/dL (ref 8.4–10.5)
CO2: 25 mEq/L (ref 19–32)
Chloride: 105 mEq/L (ref 96–112)
Creatinine, Ser: 0.7 mg/dL (ref 0.4–1.5)
GFR: 128.94 mL/min (ref >60.00)
Glucose, Bld: 121 mg/dL — ABNORMAL HIGH (ref 70–99)
Potassium: 4.2 mEq/L (ref 3.5–5.1)
SODIUM: 139 meq/L (ref 135–145)

## 2013-08-12 LAB — LIPID PANEL
Cholesterol: 153 mg/dL (ref 0–200)
HDL: 37.1 mg/dL — AB (ref >39.00)
LDL Cholesterol: 86 mg/dL (ref 0–99)
TRIGLYCERIDES: 149 mg/dL (ref 0.0–149.0)
Total CHOL/HDL Ratio: 4
VLDL: 29.8 mg/dL (ref 0.0–40.0)

## 2013-08-12 LAB — HEPATIC FUNCTION PANEL
ALT: 40 U/L (ref 0–53)
AST: 38 U/L — ABNORMAL HIGH (ref 0–37)
Albumin: 4.2 g/dL (ref 3.5–5.2)
Alkaline Phosphatase: 53 U/L (ref 39–117)
BILIRUBIN DIRECT: 0.3 mg/dL (ref 0.0–0.3)
BILIRUBIN TOTAL: 1.8 mg/dL — AB (ref 0.3–1.2)
Total Protein: 6.8 g/dL (ref 6.0–8.3)

## 2013-08-12 LAB — PSA: PSA: 1.54 ng/mL (ref 0.10–4.00)

## 2013-08-12 LAB — MICROALBUMIN / CREATININE URINE RATIO
Creatinine,U: 169.5 mg/dL
MICROALB UR: 1.3 mg/dL (ref 0.0–1.9)
Microalb Creat Ratio: 0.8 mg/g (ref 0.0–30.0)

## 2013-08-12 LAB — TSH: TSH: 2.91 u[IU]/mL (ref 0.35–5.50)

## 2013-08-12 LAB — HEMOGLOBIN A1C: Hgb A1c MFr Bld: 6.8 % — ABNORMAL HIGH (ref 4.6–6.5)

## 2013-08-12 NOTE — Addendum Note (Signed)
Addended by: Rita OharaHRASHER, Mikyle Sox R on: 08/12/2013 03:44 PM   Modules accepted: Orders

## 2013-08-13 LAB — PATHOLOGIST SMEAR REVIEW

## 2013-08-19 ENCOUNTER — Ambulatory Visit (INDEPENDENT_AMBULATORY_CARE_PROVIDER_SITE_OTHER): Payer: BC Managed Care – PPO | Admitting: Family Medicine

## 2013-08-19 ENCOUNTER — Encounter: Payer: Self-pay | Admitting: Family Medicine

## 2013-08-19 VITALS — BP 130/90 | Temp 98.4°F | Ht 68.75 in | Wt 220.0 lb

## 2013-08-19 DIAGNOSIS — Z23 Encounter for immunization: Secondary | ICD-10-CM

## 2013-08-19 DIAGNOSIS — L2089 Other atopic dermatitis: Secondary | ICD-10-CM

## 2013-08-19 DIAGNOSIS — E119 Type 2 diabetes mellitus without complications: Secondary | ICD-10-CM

## 2013-08-19 DIAGNOSIS — Z0000 Encounter for general adult medical examination without abnormal findings: Secondary | ICD-10-CM

## 2013-08-19 DIAGNOSIS — R7989 Other specified abnormal findings of blood chemistry: Secondary | ICD-10-CM

## 2013-08-19 MED ORDER — TRIAMCINOLONE ACETONIDE 0.1 % EX CREA: 1.0000 "application " | TOPICAL_CREAM | Freq: Two times a day (BID) | CUTANEOUS | Status: DC

## 2013-08-19 MED ORDER — LISINOPRIL 5 MG PO TABS: 5.0000 mg | ORAL_TABLET | Freq: Every day | ORAL | Status: DC

## 2013-08-19 MED ORDER — METFORMIN HCL 500 MG PO TABS: 500.0000 mg | ORAL_TABLET | Freq: Two times a day (BID) | ORAL | Status: DC

## 2013-08-19 NOTE — Progress Notes (Signed)
Pre visit review using our clinic review tool, if applicable. No additional management support is needed unless otherwise documented below in the visit note. 

## 2013-08-19 NOTE — Patient Instructions (Signed)
Continue the metformin one twice daily  Followup in 6 months for your diabetes  Had lisinopril 5 mg daily for renal protection  Continue to avoid smoke  Moisturizer and triamcinolone gel small amounts of each twice daily for the eczema

## 2013-08-19 NOTE — Progress Notes (Signed)
   Subjective:    Patient ID: [name], male    DOB: 07-Feb-1956, 58 y.o.   MRN: [id number]  HPI [name] is a 58 year old married male nonsmoker who comes in today for general physical examination because of a history of diabetes  He takes 500 mg of metformin twice a day A1c 6.8%  Will add a low-dose lisinopril  He does not get routine eye care. We'll refer to Dr. Hazle Quantigby  To gets regular dental care. Followup colonoscopy and GI  Vaccinations updated  He does have a sebaceous cyst on his back and some scarring of the tendon sheaths in his right hand   Review of Systems  Constitutional: Negative.   HENT: Negative.   Eyes: Negative.   Respiratory: Negative.   Cardiovascular: Negative.   Gastrointestinal: Negative.   Genitourinary: Negative.   Musculoskeletal: Negative.   Skin: Negative.   Neurological: Negative.   Psychiatric/Behavioral: Negative.        Objective:   Physical Exam  Nursing note and vitals reviewed. Constitutional: He is oriented to person, place, and time. He appears well-developed and well-nourished.  HENT:  Head: Normocephalic and atraumatic.  Right Ear: External ear normal.  Left Ear: External ear normal.  Nose: Nose normal.  Mouth/Throat: Oropharynx is clear and moist.  Profound deafness  Eyes: Conjunctivae and EOM are normal. Pupils are equal, round, and reactive to light.  Neck: Normal range of motion. Neck supple. No JVD present. No tracheal deviation present. No thyromegaly present.  Cardiovascular: Normal rate, regular rhythm, normal heart sounds and intact distal pulses.  Exam reveals no gallop and no friction rub.   No murmur heard. No carotid aortic bruits peripheral pulses 2+ and symmetrical  Pulmonary/Chest: Effort normal and breath sounds normal. No stridor. No respiratory distress. He has no wheezes. He has no rales. He exhibits no tenderness.  Abdominal: Soft. Bowel sounds are normal. He exhibits no distension and no mass. There is no  tenderness. There is no rebound and no guarding.  Genitourinary: Rectum normal, prostate normal and penis normal. Guaiac negative stool. No penile tenderness.  Musculoskeletal: Normal range of motion. He exhibits no edema and no tenderness.  Lymphadenopathy:    He has no cervical adenopathy.  Neurological: He is alert and oriented to person, place, and time. He has normal reflexes. No cranial nerve deficit. He exhibits normal muscle tone.  Skin: Skin is warm and dry. No rash noted. No erythema. No pallor.  Eczema of lower extremities  Psychiatric: He has a normal mood and affect. His behavior is normal. Judgment and thought content normal.          Assessment & Plan:  Diabetes at goal continue current therapy add ACE inhibitor  Profound deafness......... hearing aids  Eczema....... triamcinolone gel

## 2013-08-26 ENCOUNTER — Other Ambulatory Visit: Payer: Self-pay | Admitting: Family Medicine

## 2013-08-27 ENCOUNTER — Other Ambulatory Visit: Payer: Self-pay | Admitting: Family Medicine

## 2013-08-27 DIAGNOSIS — Z87891 Personal history of nicotine dependence: Secondary | ICD-10-CM

## 2013-08-28 ENCOUNTER — Telehealth: Payer: Self-pay

## 2013-08-28 NOTE — Telephone Encounter (Signed)
Relevant patient education assigned to patient using Emmi. ° °

## 2013-09-06 ENCOUNTER — Ambulatory Visit
Admission: RE | Admit: 2013-09-06 | Discharge: 2013-09-06 | Disposition: A | Discharge status: Home / Self Care | Payer: No Typology Code available for payment source | Source: Ambulatory Visit | Attending: Family Medicine | Admitting: Family Medicine

## 2013-09-06 DIAGNOSIS — Z87891 Personal history of nicotine dependence: Secondary | ICD-10-CM

## 2013-09-12 ENCOUNTER — Other Ambulatory Visit: Payer: Self-pay | Admitting: Family Medicine

## 2013-09-12 DIAGNOSIS — R9389 Abnormal findings on diagnostic imaging of other specified body structures: Secondary | ICD-10-CM

## 2013-09-13 ENCOUNTER — Other Ambulatory Visit: Payer: Self-pay | Admitting: Family Medicine

## 2013-09-13 DIAGNOSIS — R918 Other nonspecific abnormal finding of lung field: Secondary | ICD-10-CM

## 2013-09-18 ENCOUNTER — Encounter: Payer: Self-pay | Admitting: Internal Medicine

## 2013-09-24 ENCOUNTER — Other Ambulatory Visit (INDEPENDENT_AMBULATORY_CARE_PROVIDER_SITE_OTHER): Payer: BC Managed Care – PPO

## 2013-09-24 DIAGNOSIS — R7989 Other specified abnormal findings of blood chemistry: Secondary | ICD-10-CM

## 2013-09-24 DIAGNOSIS — E119 Type 2 diabetes mellitus without complications: Secondary | ICD-10-CM

## 2013-09-24 LAB — BASIC METABOLIC PANEL
BUN: 17 mg/dL (ref 6–23)
CHLORIDE: 103 meq/L (ref 96–112)
CO2: 24 meq/L (ref 19–32)
Calcium: 9.4 mg/dL (ref 8.4–10.5)
Creatinine, Ser: 0.8 mg/dL (ref 0.4–1.5)
GFR: 106.57 mL/min (ref >60.00)
Glucose, Bld: 111 mg/dL — ABNORMAL HIGH (ref 70–99)
Potassium: 4 mEq/L (ref 3.5–5.1)
SODIUM: 137 meq/L (ref 135–145)

## 2013-09-24 LAB — HEMOGLOBIN A1C: Hgb A1c MFr Bld: 6.3 % (ref 4.6–6.5)

## 2013-10-08 ENCOUNTER — Telehealth: Payer: Self-pay | Admitting: Family Medicine

## 2013-10-08 NOTE — Telephone Encounter (Signed)
Left message on machine returning patient's call 

## 2013-10-08 NOTE — Telephone Encounter (Signed)
Pt is questioning lab test that was done 09/24/12 and did not include the white blood cell count  As the reason why he had the test done would like a call back [phone]

## 2013-10-11 NOTE — Telephone Encounter (Signed)
Left message on machine for patient to return our call 

## 2013-10-14 NOTE — Telephone Encounter (Signed)
Spoke with patient and lab appointment made. 

## 2013-10-15 ENCOUNTER — Other Ambulatory Visit: Payer: BC Managed Care – PPO

## 2013-10-16 ENCOUNTER — Other Ambulatory Visit: Payer: BC Managed Care – PPO

## 2013-10-16 LAB — CBC WITH DIFFERENTIAL/PLATELET
BASOS ABS: 0 10*3/uL (ref 0.0–0.1)
BASOS PCT: 0.3 % (ref 0.0–3.0)
Eosinophils Absolute: 0.2 10*3/uL (ref 0.0–0.7)
Eosinophils Relative: 1.8 % (ref 0.0–5.0)
HCT: 50.9 % (ref 39.0–52.0)
HEMOGLOBIN: 17.3 g/dL — AB (ref 13.0–17.0)
LYMPHS ABS: 4.1 10*3/uL — AB (ref 0.7–4.0)
Lymphocytes Relative: 30.5 % (ref 12.0–46.0)
MCHC: 34.1 g/dL (ref 30.0–36.0)
MCV: 88.4 fl (ref 78.0–100.0)
MONO ABS: 1.1 10*3/uL — AB (ref 0.1–1.0)
Monocytes Relative: 8.1 % (ref 3.0–12.0)
NEUTROS ABS: 7.9 10*3/uL — AB (ref 1.4–7.7)
Neutrophils Relative %: 59.3 % (ref 43.0–77.0)
Platelets: 259 10*3/uL (ref 150.0–400.0)
RBC: 5.75 Mil/uL (ref 4.22–5.81)
RDW: 13.8 % (ref 11.5–15.5)
WBC: 13.3 10*3/uL — ABNORMAL HIGH (ref 4.0–10.5)

## 2013-10-16 NOTE — Addendum Note (Signed)
Addended by: Bonnye FavaKWEI, Keisha Amer K on: 10/16/2013 03:59 PM   Modules accepted: Orders

## 2013-11-01 ENCOUNTER — Telehealth: Payer: Self-pay | Admitting: Family Medicine

## 2013-11-01 NOTE — Telephone Encounter (Signed)
Unfortunately, I cannot at this time.  We need to encourage folks to consider one of the new providers.

## 2013-11-01 NOTE — Telephone Encounter (Signed)
Pt wants to know if dr. Caryl Never will accept him as a new pt. Transferring from dr. Tawanna Cooler, pt states that he and dr. Tawanna Cooler is not a good fit.

## 2013-11-06 NOTE — Telephone Encounter (Signed)
Left message on vm, informed pt to call back if any additional questions.

## 2013-11-19 ENCOUNTER — Telehealth: Payer: Self-pay | Admitting: Family Medicine

## 2013-11-19 MED ORDER — SILDENAFIL CITRATE 100 MG PO TABS: 50.0000 mg | ORAL_TABLET | Freq: Every day | ORAL | Status: DC | PRN

## 2013-11-19 NOTE — Telephone Encounter (Signed)
Pt  called to ask for a rx on viagra . Pharmacy  [facility] [address] [place]

## 2013-12-24 ENCOUNTER — Telehealth: Payer: Self-pay | Admitting: Family Medicine

## 2013-12-24 MED ORDER — SILDENAFIL CITRATE 100 MG PO TABS
50.0000 mg | ORAL_TABLET | Freq: Every day | ORAL | Status: DC | PRN
Start: 2013-12-24 — End: 2016-01-01

## 2013-12-24 NOTE — Telephone Encounter (Signed)
Pt request rx on sildenafil (VIAGRA) 100 MG tablet  Pt is req 5 100mg  tablest and would like [facility]

## 2014-01-20 DIAGNOSIS — L309 Dermatitis, unspecified: Secondary | ICD-10-CM | POA: Insufficient documentation

## 2014-01-20 DIAGNOSIS — N529 Male erectile dysfunction, unspecified: Secondary | ICD-10-CM | POA: Insufficient documentation

## 2014-02-20 ENCOUNTER — Ambulatory Visit: Payer: BC Managed Care – PPO | Admitting: Family Medicine

## 2014-06-06 DIAGNOSIS — M25849 Other specified joint disorders, unspecified hand: Secondary | ICD-10-CM

## 2014-06-06 HISTORY — DX: Other specified joint disorders, unspecified hand: M25.849

## 2014-08-18 DIAGNOSIS — R809 Proteinuria, unspecified: Secondary | ICD-10-CM

## 2014-08-18 DIAGNOSIS — E1129 Type 2 diabetes mellitus with other diabetic kidney complication: Secondary | ICD-10-CM | POA: Insufficient documentation

## 2014-08-26 IMAGING — CT CT CHEST NODULE FOLLOW UP LOW DOSE W/O CM
1 of 4 series · 14 of 36 positions shown, 18 images · non-contrast
Comparison: CT CHEST W/CM dated 12/27/2005

CLINICAL DATA: Ex smoker, lung cancer screening.

EXAM:
CT CHEST SCREENING WITHOUT CONTRAST
TECHNIQUE: Multidetector CT imaging of the chest was performed following the
standard low-dose protocol without IV contrast.

[Series 3: lung windows · axial · 0.82mm/px · z∈[-243,+11]mm · 14 of 225 slices shown, 18 images]
[im 11/225  mediastinal]
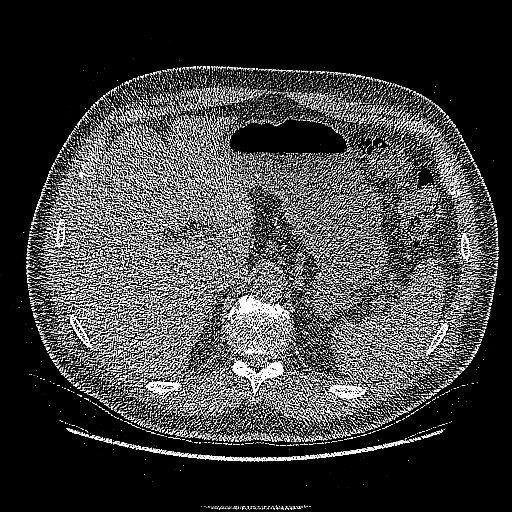
[im 11/225  lung]
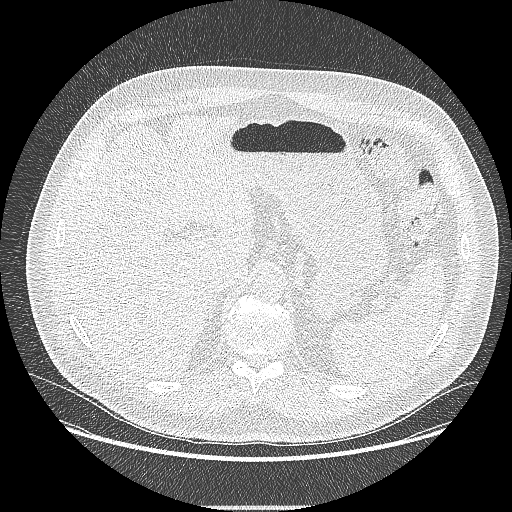
[im 43/225  lung]
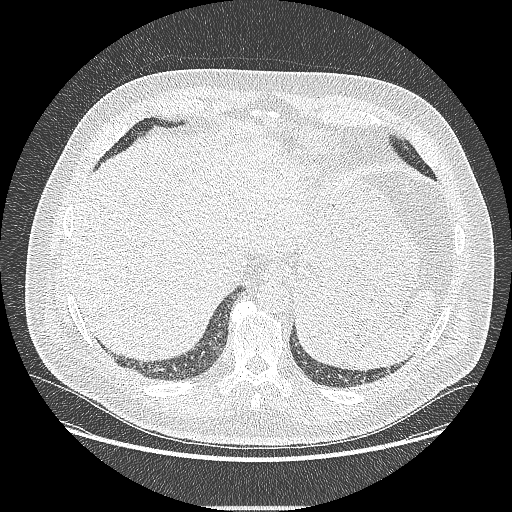
[im 54/225  lung]
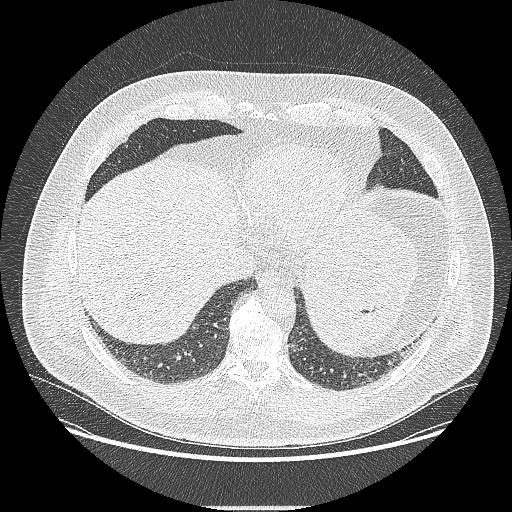
[im 65/225  lung]
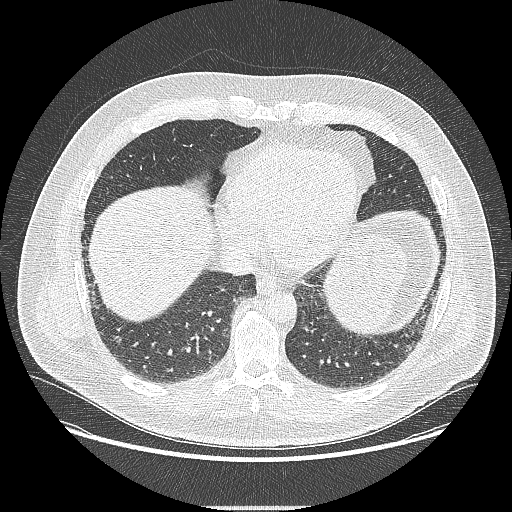
[im 86/225  mediastinal]
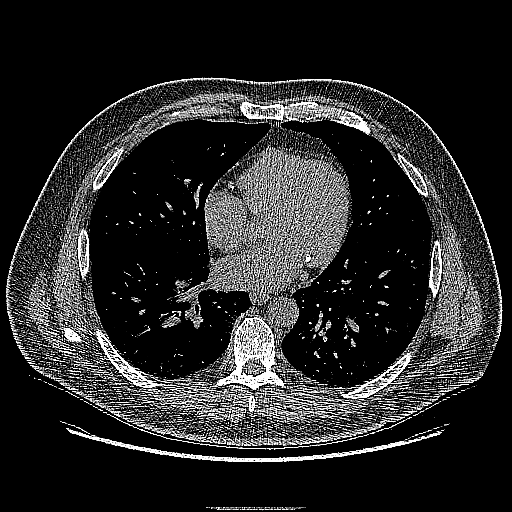
[im 86/225  lung]
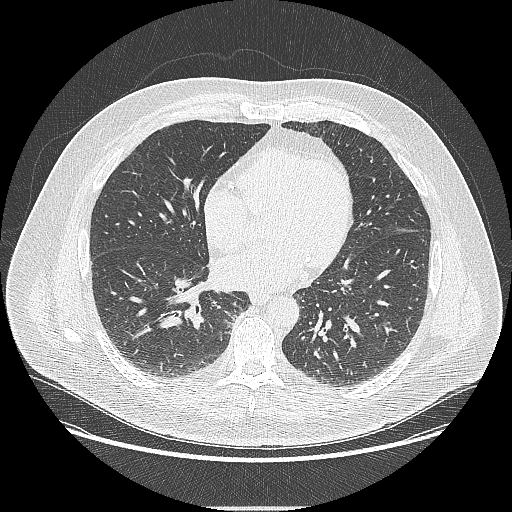
[im 97/225  lung]
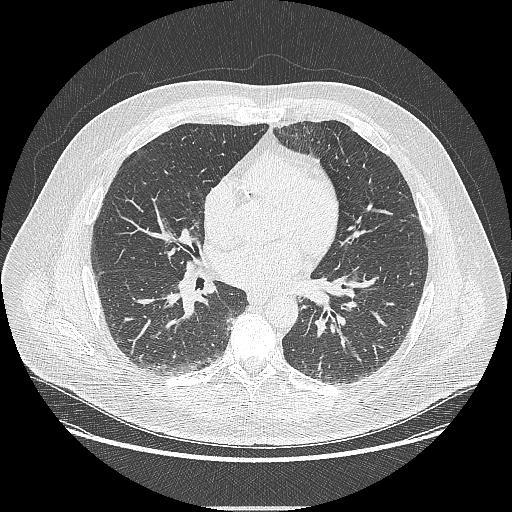
[im 107/225  lung]
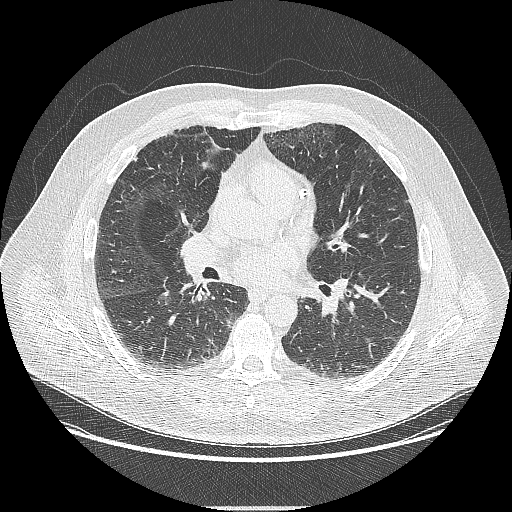
[im 129/225  lung]
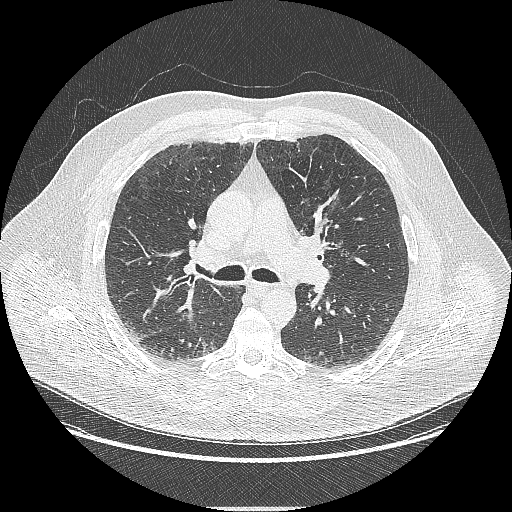
[im 139/225  mediastinal]
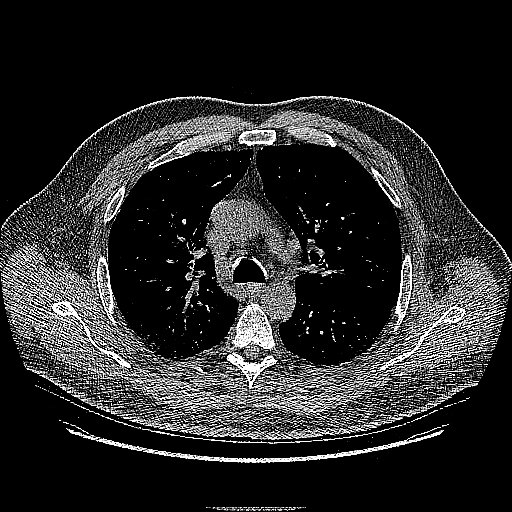
[im 139/225  lung]
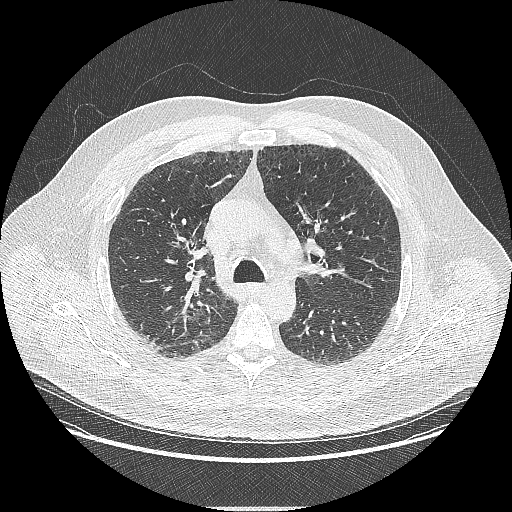
[im 150/225  lung]
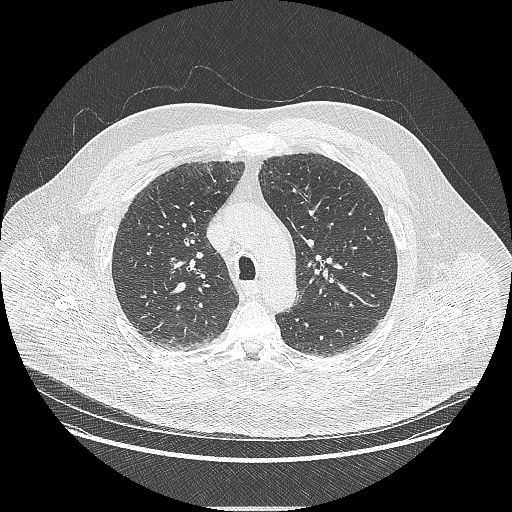
[im 171/225  lung]
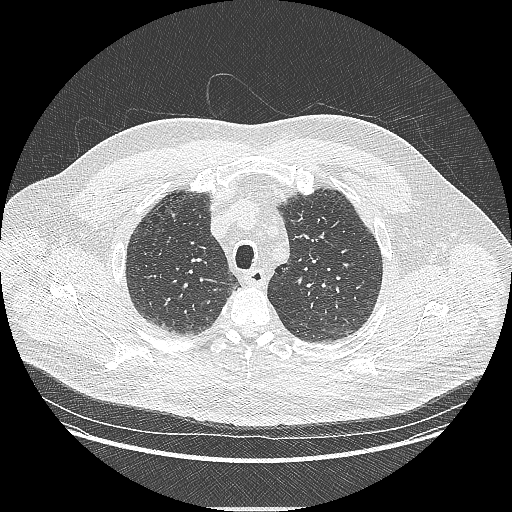
[im 182/225  lung]
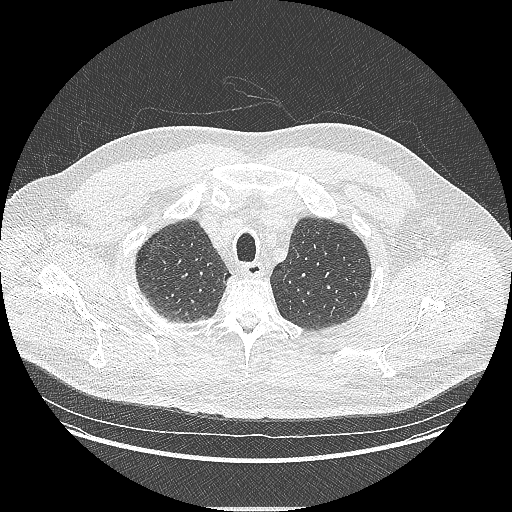
[im 193/225  mediastinal]
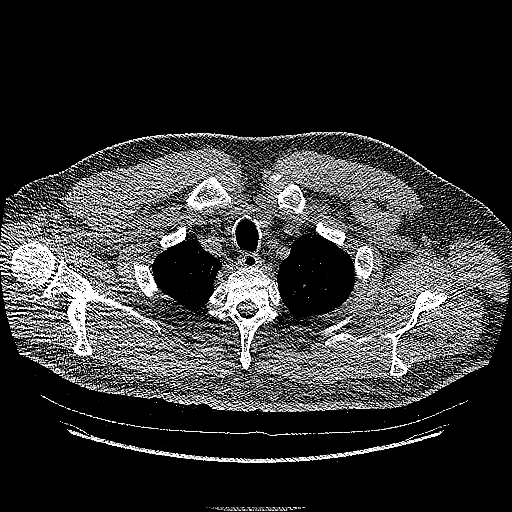
[im 193/225  lung]
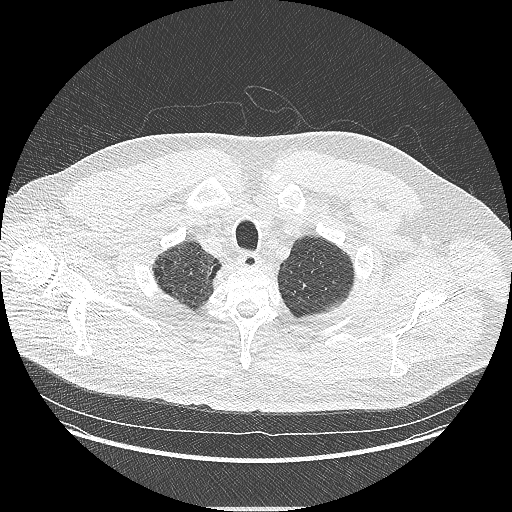
[im 214/225  lung]
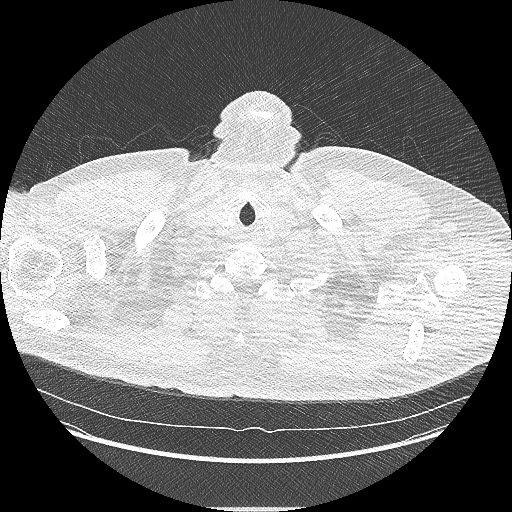

[14 of 36 positions shown; findings below may reference images not displayed]

FINDINGS: No pathologically enlarged mediastinal lymph nodes. Hilar regions
are difficult to definitively evaluate without IV contrast but
appear grossly unremarkable. No axillary adenopathy. Extensive 3
vessel coronary artery calcification. Heart size normal. No
pericardial effusion.

Emphysema is predominantly paraseptal. An 8 mm (6 x 9 mm) nodule
along the medial aspect of the minor fissure (series 3, image 116)
is unchanged from 12/27/2005. An adjacent 8 mm (7 x 9 mm) nodule
along the minor fissure (image 120) is likely stable although less
well visualized on the prior examination, probably due to greater
slice thickness on the prior exam. Additional scattered pulmonary
nodules measure 4 mm or less in size bilaterally and are not well
visualized on the prior study. Scattered mild pulmonary parenchymal
scarring. There are areas of subpleural ground-glass, upper lobe
predominant and similar to the prior exam. No pleural fluid. Airway
is unremarkable.

Incidental imaging of the upper abdomen shows the visualized
portions of the liver, adrenal glands, spleen, pancreas, stomach and
bowel to be grossly unremarkable. No upper abdominal adenopathy. No
worrisome lytic or sclerotic lesions. Degenerative changes are seen
in the spine.
IMPRESSION: 1. 8 mm nodule along the minor fissure was likely present on
12/27/2005 but is difficult to definitively confirm due to
differences in slice thickness. Lung-RADS Category 3, probably
benign findings. Short-term follow up in 6 months is recommended
with low-dose chest CT without contrast (please use the following
order, "CT chest low-dose screening follow-up"). These results will
be called to the ordering clinician or representative by the
Radiologist Assistant, and communication documented in the PACS
Dashboard.
2. Additional scattered pulmonary nodules measure 4 mm or less in
size.
3. Extensive 3 vessel coronary artery calcification.

## 2014-12-01 ENCOUNTER — Other Ambulatory Visit: Payer: Self-pay

## 2015-06-07 DIAGNOSIS — H9190 Unspecified hearing loss, unspecified ear: Secondary | ICD-10-CM

## 2015-06-07 DIAGNOSIS — N529 Male erectile dysfunction, unspecified: Secondary | ICD-10-CM

## 2015-06-07 HISTORY — DX: Unspecified hearing loss, unspecified ear: H91.90

## 2015-06-07 HISTORY — DX: Male erectile dysfunction, unspecified: N52.9

## 2015-10-08 DIAGNOSIS — I2510 Atherosclerotic heart disease of native coronary artery without angina pectoris: Secondary | ICD-10-CM | POA: Insufficient documentation

## 2015-12-19 NOTE — Pre-Procedure Instructions (Addendum)
John MilianMark F Coffey  12/19/2015     Your procedure is scheduled on July 27.  [facility]30 [facility].M.  Call this number if you have problems the morning of surgery:  [phone]   Remember:  Do not eat food or drink liquids after midnight.  Take these medicines the morning of surgery with A SIP OF WATER Levothyroxine, Metoprolol, Tramadol (if needed)   STOP Fish Oil, Multiple Vitamin, Ibuprofen, Vitamin D, Aspirin Thursday July 20   STOP/ Do not take Aspirin, Aleve, Naproxen, Advil, Ibuprofen, Motrin, Vitamins, Herbs, or Supplements starting Thursday July 20  No metformin, glipizide am of surgery    How to Manage Your Diabetes Before and After Surgery  Why is it important to control my blood sugar before and after surgery? . Improving blood sugar levels before and after surgery helps healing and can limit problems. . A way of improving blood sugar control is eating a healthy diet by: o  Eating less sugar and carbohydrates o  Increasing activity/exercise o  Talking with your doctor about reaching your blood sugar goals . High blood sugars (greater than 180 mg/dL) can raise your risk of infections and slow your recovery, so you will need to focus on controlling your diabetes during the weeks before surgery. . Make sure that the doctor who takes care of your diabetes knows about your planned surgery including the date and location.  How do I manage my blood sugar before surgery? . Check your blood sugar at least 4 times a day, starting 2 days before surgery, to make sure that the level is not too high or low. o Check your blood sugar the morning of your surgery when you wake up and every 2 hours until you get to the Short Stay unit. . If your blood sugar is less than 70 mg/dL, you will need to treat for low blood sugar: o Do not take insulin. o Treat a low blood sugar (less than 70 mg/dL) with  cup of clear juice (cranberry or apple), 4 glucose tablets,  OR glucose gel. o Recheck blood sugar in 15 minutes after treatment (to make sure it is greater than 70 mg/dL). If your blood sugar is not greater than 70 mg/dL on recheck, call [phone] for further instructions. . Report your blood sugar to the short stay nurse when you get to Short Stay.  . If you are admitted to the hospital after surgery: o Your blood sugar will be checked by the staff and you will probably be given insulin after surgery (instead of oral diabetes medicines) to make sure you have good blood sugar levels. o The goal for blood sugar control after surgery is 80-180 mg/dL.   WHAT DO I DO ABOUT MY DIABETES MEDICATION?  Marland Kitchen. Do not take oral diabetes medicines (pills) the morning of surgery.(metformin, glipizide)    Do not wear jewelry, make-up or nail polish.  Do not wear lotions, powders, or perfumes.  You may wear deoderant.  Do not shave 48 hours prior to surgery.  Men may shave face and neck.  Do not bring valuables to the hospital.  [facility] is not responsible for any belongings or valuables.  Contacts, dentures or bridgework may not be worn into surgery.  Leave your suitcase in the car.  After surgery it may be brought to your room.  For patients admitted to the hospital, discharge time will be determined by your treatment team.  Patients discharged the  day of surgery will not be allowed to drive home.   [place] - Preparing for Surgery  Before surgery, you can play an important role.  Because skin is not sterile, your skin needs to be as free of germs as possible.  You can reduce the number of germs on you skin by washing with CHG (chlorahexidine gluconate) soap before surgery.  CHG is an antiseptic cleaner which kills germs and bonds with the skin to continue killing germs even after washing.  Please DO NOT use if you have an allergy to CHG or antibacterial soaps.  If your skin becomes reddened/irritated stop using the CHG and inform your nurse when you  arrive at Short Stay.  Do not shave (including legs and underarms) for at least 48 hours prior to the first CHG shower.  You may shave your face.  Please follow these instructions carefully:   1.  Shower with CHG Soap the night before surgery and the morning of Surgery.  2.  If you choose to wash your hair, wash your hair first as usual with your normal shampoo.  3.  After you shampoo, rinse your hair and body thoroughly to remove the shampoo.  4.  Use CHG as you would any other liquid soap.  You can apply CHG directly to the skin and wash gently with scrungie or a clean washcloth.  5.  Apply the CHG Soap to your body ONLY FROM THE NECK DOWN.  Do not use on open wounds or open sores.  Avoid contact with your eyes, ears, mouth and genitals (private parts).  Wash genitals (private parts) with your normal soap.  6.  Wash thoroughly, paying special attention to the area where your surgery will be performed.  7.  Thoroughly rinse your body with warm water from the neck down.  8.  DO NOT shower/wash with your normal soap after using and rinsing off the CHG Soap.  9.  Pat yourself dry with a clean towel.            10.  Wear clean pajamas.            11.  Place clean sheets on your bed the night of your first shower and do not sleep with pets.  Day of Surgery  Do not apply any lotions the morning of surgery.  Please wear clean clothes to the hospital/surgery center.  Please read over the  fact sheets that you were given.

## 2015-12-21 ENCOUNTER — Encounter (HOSPITAL_COMMUNITY): Payer: Self-pay

## 2015-12-21 ENCOUNTER — Ambulatory Visit (HOSPITAL_COMMUNITY)
Admission: RE | Admit: 2015-12-21 | Discharge: 2015-12-21 | Disposition: A | Discharge status: Home / Self Care | Payer: BC Managed Care – PPO | Source: Ambulatory Visit | Attending: Orthopedic Surgery | Admitting: Orthopedic Surgery

## 2015-12-21 ENCOUNTER — Encounter (HOSPITAL_COMMUNITY)
Admission: RE | Admit: 2015-12-21 | Discharge: 2015-12-21 | Disposition: A | Discharge status: Home / Self Care | Payer: BC Managed Care – PPO | Source: Ambulatory Visit | Attending: Orthopaedic Surgery | Admitting: Orthopaedic Surgery

## 2015-12-21 DIAGNOSIS — Z01818 Encounter for other preprocedural examination: Secondary | ICD-10-CM | POA: Insufficient documentation

## 2015-12-21 HISTORY — DX: Essential (primary) hypertension: I10

## 2015-12-21 HISTORY — DX: Unspecified osteoarthritis, unspecified site: M19.90

## 2015-12-21 HISTORY — DX: Inflammatory liver disease, unspecified: K75.9

## 2015-12-21 HISTORY — DX: Gastro-esophageal reflux disease without esophagitis: K21.9

## 2015-12-21 HISTORY — DX: Type 2 diabetes mellitus without complications: E11.9

## 2015-12-21 HISTORY — DX: Hypothyroidism, unspecified: E03.9

## 2015-12-21 LAB — COMPREHENSIVE METABOLIC PANEL
ALK PHOS: 56 U/L (ref 38–126)
ALT: 25 U/L (ref 17–63)
AST: 24 U/L (ref 15–41)
Albumin: 4.5 g/dL (ref 3.5–5.0)
Anion gap: 9 (ref 5–15)
BILIRUBIN TOTAL: 2 mg/dL — AB (ref 0.3–1.2)
BUN: 19 mg/dL (ref 6–20)
CO2: 23 mmol/L (ref 22–32)
CREATININE: 0.92 mg/dL (ref 0.61–1.24)
Calcium: 10 mg/dL (ref 8.9–10.3)
Chloride: 100 mmol/L — ABNORMAL LOW (ref 101–111)
GFR calc Af Amer: >60 mL/min (ref >60)
GLUCOSE: 126 mg/dL — AB (ref 65–99)
Potassium: 4.8 mmol/L (ref 3.5–5.1)
Sodium: 132 mmol/L — ABNORMAL LOW (ref 135–145)
TOTAL PROTEIN: 8 g/dL (ref 6.5–8.1)

## 2015-12-21 LAB — CBC WITH DIFFERENTIAL/PLATELET
BASOS ABS: 0.1 10*3/uL (ref 0.0–0.1)
Basophils Relative: 0 %
Eosinophils Absolute: 0.3 10*3/uL (ref 0.0–0.7)
Eosinophils Relative: 2 %
HEMATOCRIT: 50.9 % (ref 39.0–52.0)
Hemoglobin: 17.3 g/dL — ABNORMAL HIGH (ref 13.0–17.0)
LYMPHS PCT: 29 %
Lymphs Abs: 4 10*3/uL (ref 0.7–4.0)
MCH: 29.6 pg (ref 26.0–34.0)
MCHC: 34 g/dL (ref 30.0–36.0)
MCV: 87.2 fL (ref 78.0–100.0)
MONO ABS: 1.2 10*3/uL — AB (ref 0.1–1.0)
Monocytes Relative: 9 %
NEUTROS ABS: 8.2 10*3/uL — AB (ref 1.7–7.7)
NEUTROS PCT: 60 %
Platelets: 257 10*3/uL (ref 150–400)
RBC: 5.84 MIL/uL — AB (ref 4.22–5.81)
RDW: 12.9 % (ref 11.5–15.5)
WBC: 13.7 10*3/uL — AB (ref 4.0–10.5)

## 2015-12-21 LAB — TYPE AND SCREEN
ABO/RH(D): AB POS
Antibody Screen: NEGATIVE

## 2015-12-21 LAB — GLUCOSE, CAPILLARY: GLUCOSE-CAPILLARY: 134 mg/dL — AB (ref 65–99)

## 2015-12-21 LAB — SURGICAL PCR SCREEN
MRSA, PCR: NEGATIVE
STAPHYLOCOCCUS AUREUS: NEGATIVE

## 2015-12-21 LAB — URINALYSIS, ROUTINE W REFLEX MICROSCOPIC
BILIRUBIN URINE: NEGATIVE
GLUCOSE, UA: NEGATIVE mg/dL
HGB URINE DIPSTICK: NEGATIVE
Ketones, ur: NEGATIVE mg/dL
Leukocytes, UA: NEGATIVE
Nitrite: NEGATIVE
PROTEIN: NEGATIVE mg/dL
Specific Gravity, Urine: 1.019 (ref 1.005–1.030)
pH: 5 (ref 5.0–8.0)

## 2015-12-21 LAB — PROTIME-INR
INR: 1.05 (ref 0.00–1.49)
PROTHROMBIN TIME: 13.9 s (ref 11.6–15.2)

## 2015-12-21 LAB — APTT: APTT: 33 s (ref 24–37)

## 2015-12-22 ENCOUNTER — Encounter (HOSPITAL_COMMUNITY): Payer: Self-pay

## 2015-12-22 LAB — ABO/RH: ABO/RH(D): AB POS

## 2015-12-22 LAB — URINE CULTURE
CULTURE: NO GROWTH
SPECIAL REQUESTS: NORMAL

## 2015-12-22 LAB — HEMOGLOBIN A1C
HEMOGLOBIN A1C: 6.3 % — AB (ref 4.8–5.6)
MEAN PLASMA GLUCOSE: 134 mg/dL

## 2015-12-22 NOTE — Progress Notes (Signed)
Anesthesia Chart Review:  Pt is a 60 year old male scheduled for R total hip arthroplasty on 12/31/2015 with Norlene CampbellPeter Whitfield, MD.   PCP is Synetta Failaniel Jobe, MD.   PMH includes:  HTN, DM, hepatitis, hypothyroidism, GERD. Former smoker. BMI 33  Medications include: ASA, glipizide, levothyroxine, lisinopril, metoformin, metoprolol, tadalafil.   Chest x-ray 12/21/15 reviewed. Mild chronic bronchitic changes. Aortic atherosclerosis. No acute cardiopulmonary abnormality.  EKG 10/08/15: sinus rhythm. Cannot exclude old inferior infarct.   Stress echo 10/29/13:  - Normal resting LV systolic function - Good exercise capacity, 11.8 METS - Normal bp response to exercise - Normal stress echo and EKG, no evidence of ischemia  Pt saw Tollie PizzaKurt Daniel, DO with cardiology (care everywhere) for pre-op eval and was cleared for surgery.   If no changes, I anticipate pt can proceed with surgery as scheduled.   Rica Mastngela Regena Delucchi, FNP-BC [facility]An[facility]iology Phone: [phone] 12/22/2015 4:36 PM

## 2015-12-24 NOTE — H&P (Signed)
CHIEF COMPLAINT:  Painful right hip.   HISTORY OF PRESENT ILLNESS:  [name] is a 60 year old white male who is seen today for evaluation of his right hip.  He had an insidious onset of pain in both of his hips, right greater than left, in the fall of 2016 without any history of injury or trauma.  He had noticed that more and more he was having problems standing and walking and with groin pain.  He is a [name], broadcasting/film/video.  Apparently it had gotten to the point where he was seen in [facility] by Dr. Thamas Jaegers and in April was given a corticosteroid injection into his right hip under fluoroscopy which helped for a very short period of time.  Apparently it was then decided to replace his hip but was unable to have surgery until August, and he wanted it before his school started.  We have seen him previously for rotator cuff repair, and he had done well with that.  He is now to the point where he cannot sleep and he has pain all the time.  He has difficulty with walking and actually has to use a cane.  He has tried a host of medications, especially the NSAIDs, including that of Lodine, Celebrex, Aleve, aspirin, and also the use of Tylenol.  Unfortunately, nothing is working for him.  He comes in today with significant pain and discomfort and difficulty with ambulation.  Seen today for evaluation.   PAST MEDICAL HISTORY:  In general, his health is good.   PAST SURGICAL HISTORY/HOSPITALIZATIONS:  In 1959, appendectomy.  In 1961, tonsillectomy.  In 1998, C4 diskectomy.  In 2015, left rotator cuff repair.   MEDICATIONS:  Metoprolol 25 mg daily, tramadol 50 mg q. 6 hours, metformin 1000 mg twice a day, lisinopril 10 mg daily, glipizide 5 mg daily, levothyroxine 75 mcg daily, aspirin 81 mg daily, vitamin D3 2000 IU daily, fish oil 1000 mg daily, multivitamin daily, Cialis 5 mg p.r.n.   ALLERGIES:  None known.   FOURTEEN-POINT REVIEW OF SYSTEMS:  Unremarkable except for history of glasses and the use of hearing aids.  He is  also on antihypertensive, that of lisinopril, because of his diabetes.  They are trying to protect his renal blood flow.  He did have hemorrhagic ulcers 25 years ago, none since.  Questionable hepatitis A has been noted.  He has type 2 diabetes for the past 4 years.  He is hypothyroid for the past 6 months.  He still has problems with being tired, and they keep increasing his levothyroxine.  He does also have eczema.   FAMILY HISTORY:  Reveals mother who died at 44, and apparently she had a skull fracture and became dehydrated, developed pneumonia, and passed.  Father is still alive at 64.  He has had gout and type 2 diabetes.  He has 1 brother age 61 who is autistic.  He has 1 sister that he states is completely healthy at age 21.   SOCIAL HISTORY:  Mr. Bordelon is a 60 year old white married male, a [name], broadcasting/film/video.  He quit smoking cigarettes in March of this year.  Prior to that he was smoking at least 1/2 pack per day for 40 years.  He does drink usually 6 times per week, and he drinks 2 on a daily basis.   PHYSICAL EXAMINATION:   General:  A 60 year old white male well developed, well nourished, alert, cooperative, in moderate distress secondary to right hip pain. Vitals:  Height 5 feet 8 inches.  Weight 219 pounds.  BMI 33.3.  Temperature 97.9.  Pulse 86.  Respirations 16.  Blood pressure 138/78. Head:  Normocephalic. Eyes:  Pupils equal and reactive to light and accommodation with extraocular movements intact. Ears/Nose/Throat:  Benign. Neck:  Supple.  No carotid bruits. Chest:  Had good expansion. Lungs:  Essentially clear with decreased breath sounds. Cardiac:  Had a regular rhythm and rate.  Normal S1, S2.  No murmurs noted. Pulses:  Were 1+, bilateral and symmetric, in the lower extremities. Abdomen:  Scaphoid, soft, nontender.  No mass palpable.  Normal bowel sounds present. CNS:  He is oriented x3, and cranial nerves II through XII grossly intact. Breast/Rectal/Genital:  Not indicated for an  orthopedic evaluation. Musculoskeletal:  Today he has very limited range of motion of his right hip, and it is quite painful as I try to do this.  I can get him to about 80 to 90 degrees with very little internal and external rotation.    RADIOGRAPHS:  Studies reveal bone-on-bone femoral acetabular space, more superior and laterally.  There are cystic changes in the femoral head and also a fairly large cyst in the lateral acetabulum and the superior portion of the femoral head.   CLINICAL IMPRESSION:   1.  End-stage OA of the right hip. 2.  History of diabetes mellitus. 3.  Hypothyroidism. 4.  History of hepatitis, probable A. 5.  Eczema.   RECOMMENDATIONS:   1.  At this time certainly he would be a candidate for a right total hip arthroplasty.  I have reviewed a note from Dr. Fanny DanceVelasquez, who is his medical doctor, who felt that he was an acceptable risk for total hip arthroplasty.  Dr. Reuel Boomaniel, who is an interventional cardiologist, felt that from the standpoint of cardiac he is a low risk of perioperative cardiovascular disease and feels that he is also able to have the surgery performed. 2.  With this, our plan is to proceed with a right total hip arthroplasty.  The procedure, risks, and benefits were fully explained to him in detail using appropriate models.  We went over the complications again with him in detail, and all questions that were asked were answered completely  Arlys JohnBrian D. Aleda Granaetrarca, PA-C [facility] [phone]  12/24/2015 1:45 PM

## 2015-12-25 ENCOUNTER — Encounter (HOSPITAL_COMMUNITY)
Admission: RE | Admit: 2015-12-25 | Discharge: 2015-12-25 | Disposition: A | Discharge status: Home / Self Care | Payer: BC Managed Care – PPO | Source: Ambulatory Visit | Attending: Orthopaedic Surgery | Admitting: Orthopaedic Surgery

## 2015-12-25 DIAGNOSIS — Z01812 Encounter for preprocedural laboratory examination: Secondary | ICD-10-CM | POA: Diagnosis not present

## 2015-12-25 DIAGNOSIS — M1611 Unilateral primary osteoarthritis, right hip: Secondary | ICD-10-CM | POA: Insufficient documentation

## 2015-12-25 LAB — CBC WITH DIFFERENTIAL/PLATELET
BASOS ABS: 0 10*3/uL (ref 0.0–0.1)
BASOS PCT: 0 %
Eosinophils Absolute: 0.4 10*3/uL (ref 0.0–0.7)
Eosinophils Relative: 3 %
HCT: 45 % (ref 39.0–52.0)
Hemoglobin: 15.2 g/dL (ref 13.0–17.0)
LYMPHS PCT: 32 %
Lymphs Abs: 3.6 10*3/uL (ref 0.7–4.0)
MCH: 29.6 pg (ref 26.0–34.0)
MCHC: 33.8 g/dL (ref 30.0–36.0)
MCV: 87.7 fL (ref 78.0–100.0)
Monocytes Absolute: 0.7 10*3/uL (ref 0.1–1.0)
Monocytes Relative: 6 %
NEUTROS ABS: 6.8 10*3/uL (ref 1.7–7.7)
Neutrophils Relative %: 59 %
Platelets: 252 10*3/uL (ref 150–400)
RBC: 5.13 MIL/uL (ref 4.22–5.81)
RDW: 12.7 % (ref 11.5–15.5)
WBC: 11.5 10*3/uL — AB (ref 4.0–10.5)

## 2015-12-25 LAB — COMPREHENSIVE METABOLIC PANEL
ALK PHOS: 58 U/L (ref 38–126)
ALT: 18 U/L (ref 17–63)
AST: 24 U/L (ref 15–41)
Albumin: 3.9 g/dL (ref 3.5–5.0)
Anion gap: 5 (ref 5–15)
BILIRUBIN TOTAL: 0.8 mg/dL (ref 0.3–1.2)
BUN: 15 mg/dL (ref 6–20)
CALCIUM: 9.4 mg/dL (ref 8.9–10.3)
CO2: 26 mmol/L (ref 22–32)
CREATININE: 0.82 mg/dL (ref 0.61–1.24)
Chloride: 104 mmol/L (ref 101–111)
Glucose, Bld: 223 mg/dL — ABNORMAL HIGH (ref 65–99)
Potassium: 4.3 mmol/L (ref 3.5–5.1)
SODIUM: 135 mmol/L (ref 135–145)
Total Protein: 6.4 g/dL — ABNORMAL LOW (ref 6.5–8.1)

## 2015-12-30 MED ORDER — SODIUM CHLORIDE 0.9 % IV SOLN: 75.0000 mL/h | INTRAVENOUS | Status: DC

## 2015-12-30 MED ORDER — ACETAMINOPHEN 10 MG/ML IV SOLN
1000.0000 mg | Freq: Once | INTRAVENOUS | Status: AC
  Administered 2015-12-31: 1000 mg via INTRAVENOUS
  Filled 2015-12-30: qty 100

## 2015-12-30 MED ORDER — TRANEXAMIC ACID 1000 MG/10ML IV SOLN
2000.0000 mg | INTRAVENOUS | Status: AC
  Administered 2015-12-31: 2000 mg via TOPICAL
  Filled 2015-12-30: qty 20

## 2015-12-30 MED ORDER — CEFAZOLIN SODIUM-DEXTROSE 2-4 GM/100ML-% IV SOLN
2.0000 g | INTRAVENOUS | Status: AC
  Administered 2015-12-31: 2 g via INTRAVENOUS
  Filled 2015-12-30: qty 100

## 2015-12-30 NOTE — Progress Notes (Addendum)
Left message of time change. Stated to arrive at 8:30am and to call us back at [phone] that he got this message. Pt. Returned call, stated Dr. Hoy Register office told him to come in at 8 AM. Stated he will arrive at 8 AM

## 2015-12-31 ENCOUNTER — Inpatient Hospital Stay (HOSPITAL_COMMUNITY): Payer: BC Managed Care – PPO | Admitting: Anesthesiology

## 2015-12-31 ENCOUNTER — Inpatient Hospital Stay (HOSPITAL_COMMUNITY)
Admission: RE | Admit: 2015-12-31 | Discharge: 2016-01-01 | DRG: 470 | Disposition: A | Discharge status: Home Health | Payer: BC Managed Care – PPO | Source: Ambulatory Visit | Attending: Orthopaedic Surgery | Admitting: Orthopaedic Surgery

## 2015-12-31 ENCOUNTER — Inpatient Hospital Stay (HOSPITAL_COMMUNITY): Payer: BC Managed Care – PPO

## 2015-12-31 ENCOUNTER — Inpatient Hospital Stay (HOSPITAL_COMMUNITY): Payer: BC Managed Care – PPO | Admitting: Emergency Medicine

## 2015-12-31 ENCOUNTER — Encounter (HOSPITAL_COMMUNITY): Payer: Self-pay | Admitting: Surgery

## 2015-12-31 ENCOUNTER — Encounter (HOSPITAL_COMMUNITY)
Admission: RE | Disposition: A | Discharge status: Home Health | Payer: Self-pay | Source: Ambulatory Visit | Attending: Orthopaedic Surgery

## 2015-12-31 DIAGNOSIS — E119 Type 2 diabetes mellitus without complications: Secondary | ICD-10-CM | POA: Diagnosis present

## 2015-12-31 DIAGNOSIS — Z7984 Long term (current) use of oral hypoglycemic drugs: Secondary | ICD-10-CM

## 2015-12-31 DIAGNOSIS — D62 Acute posthemorrhagic anemia: Secondary | ICD-10-CM | POA: Diagnosis not present

## 2015-12-31 DIAGNOSIS — Z87891 Personal history of nicotine dependence: Secondary | ICD-10-CM

## 2015-12-31 DIAGNOSIS — M1611 Unilateral primary osteoarthritis, right hip: Secondary | ICD-10-CM | POA: Diagnosis present

## 2015-12-31 DIAGNOSIS — Z96649 Presence of unspecified artificial hip joint: Secondary | ICD-10-CM

## 2015-12-31 DIAGNOSIS — Z79899 Other long term (current) drug therapy: Secondary | ICD-10-CM | POA: Diagnosis not present

## 2015-12-31 DIAGNOSIS — E039 Hypothyroidism, unspecified: Secondary | ICD-10-CM | POA: Diagnosis present

## 2015-12-31 DIAGNOSIS — Z7982 Long term (current) use of aspirin: Secondary | ICD-10-CM | POA: Diagnosis not present

## 2015-12-31 DIAGNOSIS — I10 Essential (primary) hypertension: Secondary | ICD-10-CM | POA: Diagnosis present

## 2015-12-31 DIAGNOSIS — Z9889 Other specified postprocedural states: Secondary | ICD-10-CM

## 2015-12-31 HISTORY — PX: TOTAL HIP ARTHROPLASTY: SHX124

## 2015-12-31 LAB — GLUCOSE, CAPILLARY
GLUCOSE-CAPILLARY: 97 mg/dL (ref 65–99)
GLUCOSE-CAPILLARY: 114 mg/dL — AB (ref 65–99)
Glucose-Capillary: 142 mg/dL — ABNORMAL HIGH (ref 65–99)

## 2015-12-31 SURGERY — ARTHROPLASTY, HIP, TOTAL,POSTERIOR APPROACH
Anesthesia: Monitor Anesthesia Care | Site: Hip | Laterality: Right

## 2015-12-31 MED ORDER — PROPOFOL 500 MG/50ML IV EMUL
INTRAVENOUS | Status: DC | PRN
  Administered 2015-12-31: 25 ug/kg/min via INTRAVENOUS

## 2015-12-31 MED ORDER — GLYCOPYRROLATE 0.2 MG/ML IV SOSY
PREFILLED_SYRINGE | INTRAVENOUS | Status: AC
  Filled 2015-12-31: qty 3

## 2015-12-31 MED ORDER — CLOBETASOL PROPIONATE 0.05 % EX CREA
1.0000 "application " | TOPICAL_CREAM | Freq: Two times a day (BID) | CUTANEOUS | Status: DC
  Administered 2015-12-31: 1 via TOPICAL
  Filled 2015-12-31: qty 15

## 2015-12-31 MED ORDER — BUPIVACAINE IN DEXTROSE 0.75-8.25 % IT SOLN
INTRATHECAL | Status: DC | PRN
  Administered 2015-12-31: 2 mL via INTRATHECAL

## 2015-12-31 MED ORDER — VITAMIN D 1000 UNITS PO TABS
2000.0000 [IU] | ORAL_TABLET | Freq: Every day | ORAL | Status: DC
  Administered 2016-01-01: 2000 [IU] via ORAL
  Filled 2015-12-31: qty 2

## 2015-12-31 MED ORDER — MAGNESIUM CITRATE PO SOLN
1.0000 | Freq: Once | ORAL | Status: DC | PRN
Start: 2015-12-31 — End: 2016-01-01

## 2015-12-31 MED ORDER — KETOROLAC TROMETHAMINE 15 MG/ML IJ SOLN
15.0000 mg | Freq: Four times a day (QID) | INTRAMUSCULAR | Status: AC
  Administered 2015-12-31 – 2016-01-01 (×4): 15 mg via INTRAVENOUS
  Filled 2015-12-31 (×4): qty 1

## 2015-12-31 MED ORDER — CEFAZOLIN SODIUM-DEXTROSE 2-4 GM/100ML-% IV SOLN
2.0000 g | Freq: Four times a day (QID) | INTRAVENOUS | Status: AC
  Administered 2015-12-31 (×2): 2 g via INTRAVENOUS
  Filled 2015-12-31 (×2): qty 100

## 2015-12-31 MED ORDER — METHOCARBAMOL 500 MG PO TABS: 500.0000 mg | ORAL_TABLET | Freq: Four times a day (QID) | ORAL | 0 refills | Status: DC | PRN

## 2015-12-31 MED ORDER — LIDOCAINE HCL (CARDIAC) 20 MG/ML IV SOLN
INTRAVENOUS | Status: DC | PRN
  Administered 2015-12-31: 60 mg via INTRAVENOUS

## 2015-12-31 MED ORDER — OXYCODONE HCL 5 MG PO TABS: 5.0000 mg | ORAL_TABLET | ORAL | 0 refills | Status: DC | PRN

## 2015-12-31 MED ORDER — PHENYLEPHRINE HCL 10 MG/ML IJ SOLN
INTRAMUSCULAR | Status: DC | PRN
  Administered 2015-12-31 (×4): 40 ug via INTRAVENOUS

## 2015-12-31 MED ORDER — INSULIN ASPART 100 UNIT/ML ~~LOC~~ SOLN: 0.0000 [IU] | Freq: Every day | SUBCUTANEOUS | Status: DC

## 2015-12-31 MED ORDER — MENTHOL 3 MG MT LOZG: 1.0000 | LOZENGE | OROMUCOSAL | Status: DC | PRN

## 2015-12-31 MED ORDER — CHLORHEXIDINE GLUCONATE 4 % EX LIQD: 60.0000 mL | Freq: Once | CUTANEOUS | Status: DC

## 2015-12-31 MED ORDER — INSULIN ASPART 100 UNIT/ML ~~LOC~~ SOLN
0.0000 [IU] | Freq: Three times a day (TID) | SUBCUTANEOUS | Status: DC
  Administered 2016-01-01: 2 [IU] via SUBCUTANEOUS

## 2015-12-31 MED ORDER — ONDANSETRON HCL 4 MG/2ML IJ SOLN
4.0000 mg | Freq: Four times a day (QID) | INTRAMUSCULAR | Status: DC | PRN
  Administered 2015-12-31: 4 mg via INTRAVENOUS
  Filled 2015-12-31: qty 2

## 2015-12-31 MED ORDER — DIPHENHYDRAMINE HCL 12.5 MG/5ML PO ELIX: 12.5000 mg | ORAL_SOLUTION | ORAL | Status: DC | PRN

## 2015-12-31 MED ORDER — MIDAZOLAM HCL 5 MG/5ML IJ SOLN
INTRAMUSCULAR | Status: DC | PRN
  Administered 2015-12-31: 1 mg via INTRAVENOUS

## 2015-12-31 MED ORDER — ALUM & MAG HYDROXIDE-SIMETH 200-200-20 MG/5ML PO SUSP: 30.0000 mL | ORAL | Status: DC | PRN

## 2015-12-31 MED ORDER — FENTANYL CITRATE (PF) 100 MCG/2ML IJ SOLN
INTRAMUSCULAR | Status: DC | PRN
  Administered 2015-12-31: 50 ug via INTRAVENOUS
  Administered 2015-12-31: 25 ug via INTRAVENOUS

## 2015-12-31 MED ORDER — METHOCARBAMOL 500 MG PO TABS: 500.0000 mg | ORAL_TABLET | Freq: Four times a day (QID) | ORAL | Status: DC | PRN

## 2015-12-31 MED ORDER — BUPIVACAINE-EPINEPHRINE (PF) 0.25% -1:200000 IJ SOLN
INTRAMUSCULAR | Status: DC | PRN
  Administered 2015-12-31: 30 mL

## 2015-12-31 MED ORDER — POLYETHYLENE GLYCOL 3350 17 G PO PACK
17.0000 g | PACK | Freq: Every day | ORAL | Status: DC | PRN
  Administered 2016-01-01: 17 g via ORAL
  Filled 2015-12-31: qty 1

## 2015-12-31 MED ORDER — 0.9 % SODIUM CHLORIDE (POUR BTL) OPTIME
TOPICAL | Status: DC | PRN
  Administered 2015-12-31: 1000 mL

## 2015-12-31 MED ORDER — OXYCODONE HCL 5 MG/5ML PO SOLN: 5.0000 mg | Freq: Once | ORAL | Status: DC | PRN

## 2015-12-31 MED ORDER — LEVOTHYROXINE SODIUM 75 MCG PO TABS
75.0000 ug | ORAL_TABLET | Freq: Every day | ORAL | Status: DC
  Administered 2016-01-01: 75 ug via ORAL
  Filled 2015-12-31: qty 1

## 2015-12-31 MED ORDER — HYDROMORPHONE HCL 1 MG/ML IJ SOLN: 0.2500 mg | INTRAMUSCULAR | Status: DC | PRN

## 2015-12-31 MED ORDER — METOCLOPRAMIDE HCL 5 MG/ML IJ SOLN: 5.0000 mg | Freq: Three times a day (TID) | INTRAMUSCULAR | Status: DC | PRN

## 2015-12-31 MED ORDER — METOPROLOL TARTRATE 25 MG PO TABS
25.0000 mg | ORAL_TABLET | Freq: Every day | ORAL | Status: DC
  Administered 2016-01-01: 25 mg via ORAL
  Filled 2015-12-31: qty 1

## 2015-12-31 MED ORDER — MIDAZOLAM HCL 2 MG/2ML IJ SOLN
INTRAMUSCULAR | Status: AC
  Filled 2015-12-31: qty 2

## 2015-12-31 MED ORDER — OXYCODONE HCL 5 MG PO TABS: 5.0000 mg | ORAL_TABLET | Freq: Once | ORAL | Status: DC | PRN

## 2015-12-31 MED ORDER — SODIUM CHLORIDE 0.9 % IV SOLN
75.0000 mL/h | INTRAVENOUS | Status: DC
  Administered 2015-12-31: 75 mL/h via INTRAVENOUS

## 2015-12-31 MED ORDER — PROPOFOL 1000 MG/100ML IV EMUL
INTRAVENOUS | Status: AC
  Filled 2015-12-31: qty 200

## 2015-12-31 MED ORDER — ONDANSETRON HCL 4 MG PO TABS: 4.0000 mg | ORAL_TABLET | Freq: Four times a day (QID) | ORAL | Status: DC | PRN

## 2015-12-31 MED ORDER — ALBUMIN HUMAN 5 % IV SOLN
INTRAVENOUS | Status: DC | PRN
  Administered 2015-12-31: 13:00:00 via INTRAVENOUS

## 2015-12-31 MED ORDER — METHOCARBAMOL 1000 MG/10ML IJ SOLN
500.0000 mg | Freq: Four times a day (QID) | INTRAVENOUS | Status: DC | PRN
  Filled 2015-12-31: qty 5

## 2015-12-31 MED ORDER — LISINOPRIL 10 MG PO TABS
10.0000 mg | ORAL_TABLET | Freq: Every day | ORAL | Status: DC
  Administered 2016-01-01: 10 mg via ORAL
  Filled 2015-12-31: qty 1

## 2015-12-31 MED ORDER — LACTATED RINGERS IV SOLN
INTRAVENOUS | Status: DC
  Administered 2015-12-31 (×2): via INTRAVENOUS

## 2015-12-31 MED ORDER — HYDROMORPHONE HCL 1 MG/ML IJ SOLN
0.5000 mg | INTRAMUSCULAR | Status: DC | PRN
  Administered 2015-12-31 – 2016-01-01 (×3): 1 mg via INTRAVENOUS
  Filled 2015-12-31 (×3): qty 1

## 2015-12-31 MED ORDER — FENTANYL CITRATE (PF) 250 MCG/5ML IJ SOLN
INTRAMUSCULAR | Status: AC
  Filled 2015-12-31: qty 5

## 2015-12-31 MED ORDER — METOCLOPRAMIDE HCL 5 MG PO TABS: 5.0000 mg | ORAL_TABLET | Freq: Three times a day (TID) | ORAL | Status: DC | PRN

## 2015-12-31 MED ORDER — PHENOL 1.4 % MT LIQD: 1.0000 | OROMUCOSAL | Status: DC | PRN

## 2015-12-31 MED ORDER — ONDANSETRON HCL 4 MG/2ML IJ SOLN
INTRAMUSCULAR | Status: DC | PRN
  Administered 2015-12-31: 4 mg via INTRAVENOUS

## 2015-12-31 MED ORDER — OXYCODONE HCL 5 MG PO TABS
5.0000 mg | ORAL_TABLET | ORAL | Status: DC | PRN
  Administered 2015-12-31: 10 mg via ORAL

## 2015-12-31 MED ORDER — OXYCODONE HCL 5 MG PO TABS
ORAL_TABLET | ORAL | Status: AC
  Filled 2015-12-31: qty 2

## 2015-12-31 MED ORDER — DOCUSATE SODIUM 100 MG PO CAPS
100.0000 mg | ORAL_CAPSULE | Freq: Two times a day (BID) | ORAL | Status: DC
  Administered 2015-12-31 – 2016-01-01 (×2): 100 mg via ORAL
  Filled 2015-12-31 (×2): qty 1

## 2015-12-31 MED ORDER — BUPIVACAINE-EPINEPHRINE (PF) 0.25% -1:200000 IJ SOLN
INTRAMUSCULAR | Status: AC
  Filled 2015-12-31: qty 30

## 2015-12-31 MED ORDER — ONDANSETRON HCL 4 MG/2ML IJ SOLN: 4.0000 mg | Freq: Once | INTRAMUSCULAR | Status: DC | PRN

## 2015-12-31 MED ORDER — ACETAMINOPHEN 10 MG/ML IV SOLN
1000.0000 mg | Freq: Four times a day (QID) | INTRAVENOUS | Status: DC
  Administered 2016-01-01 (×3): 1000 mg via INTRAVENOUS
  Filled 2015-12-31 (×4): qty 100

## 2015-12-31 MED ORDER — PROPOFOL 10 MG/ML IV BOLUS
INTRAVENOUS | Status: AC
  Filled 2015-12-31: qty 40

## 2015-12-31 MED ORDER — LIDOCAINE 2% (20 MG/ML) 5 ML SYRINGE
INTRAMUSCULAR | Status: AC
  Filled 2015-12-31: qty 5

## 2015-12-31 MED ORDER — RIVAROXABAN 10 MG PO TABS: 10.0000 mg | ORAL_TABLET | Freq: Every day | ORAL | 0 refills | Status: DC

## 2015-12-31 MED ORDER — SODIUM CHLORIDE 0.9 % IR SOLN: Status: DC

## 2015-12-31 MED ORDER — GLYCOPYRROLATE 0.2 MG/ML IJ SOLN
INTRAMUSCULAR | Status: DC | PRN
  Administered 2015-12-31: .15 mg via INTRAVENOUS

## 2015-12-31 MED ORDER — RIVAROXABAN 10 MG PO TABS
10.0000 mg | ORAL_TABLET | Freq: Every day | ORAL | Status: DC
  Administered 2016-01-01: 10 mg via ORAL
  Filled 2015-12-31: qty 1

## 2015-12-31 MED ORDER — BISACODYL 10 MG RE SUPP: 10.0000 mg | Freq: Every day | RECTAL | Status: DC | PRN

## 2015-12-31 SURGICAL SUPPLY — 65 items
BAG DECANTER FOR FLEXI CONT (MISCELLANEOUS) ×2 IMPLANT
BLADE SAW SAG 73X25 THK (BLADE) ×2
BLADE SAW SGTL 73X25 THK (BLADE) ×1 IMPLANT
BRUSH FEMORAL CANAL (MISCELLANEOUS) IMPLANT
CAPT HIP TOTAL 2 ×2 IMPLANT
COVER SURGICAL LIGHT HANDLE (MISCELLANEOUS) ×5 IMPLANT
DRAPE INCISE IOBAN 66X45 STRL (DRAPES) ×2 IMPLANT
DRAPE ORTHO SPLIT 77X108 STRL (DRAPES) ×6
DRAPE SURG ORHT 6 SPLT 77X108 (DRAPES) ×2 IMPLANT
DRSG AQUACEL AG ADV 3.5X 6 (GAUZE/BANDAGES/DRESSINGS) ×2 IMPLANT
DRSG MEPILEX BORDER 4X12 (GAUZE/BANDAGES/DRESSINGS) ×3 IMPLANT
DRSG MEPILEX BORDER 4X8 (GAUZE/BANDAGES/DRESSINGS) ×2 IMPLANT
DURAPREP 26ML APPLICATOR (WOUND CARE) ×6 IMPLANT
ELECT BLADE 6.5 EXT (BLADE) ×2 IMPLANT
ELECT REM PT RETURN 9FT ADLT (ELECTROSURGICAL) ×3
ELECTRODE REM PT RTRN 9FT ADLT (ELECTROSURGICAL) ×1 IMPLANT
EVACUATOR 1/8 PVC DRAIN (DRAIN) IMPLANT
FACESHIELD WRAPAROUND (MASK) ×9 IMPLANT
FACESHIELD WRAPAROUND OR TEAM (MASK) ×3 IMPLANT
GLOVE BIO SURGEON STRL SZ7 (GLOVE) ×2 IMPLANT
GLOVE BIO SURGEON STRL SZ8 (GLOVE) ×2 IMPLANT
GLOVE BIO SURGEON STRL SZ8.5 (GLOVE) ×2 IMPLANT
GLOVE BIOGEL PI IND STRL 7.0 (GLOVE) IMPLANT
GLOVE BIOGEL PI IND STRL 8 (GLOVE) ×2 IMPLANT
GLOVE BIOGEL PI IND STRL 8.5 (GLOVE) ×1 IMPLANT
GLOVE BIOGEL PI INDICATOR 7.0 (GLOVE) ×2
GLOVE BIOGEL PI INDICATOR 8 (GLOVE) ×6
GLOVE BIOGEL PI INDICATOR 8.5 (GLOVE) ×2
GLOVE ECLIPSE 8.0 STRL XLNG CF (GLOVE) ×6 IMPLANT
GLOVE SURG ORTHO 8.5 STRL (GLOVE) ×6 IMPLANT
GOWN STRL REUS W/ TWL LRG LVL3 (GOWN DISPOSABLE) ×2 IMPLANT
GOWN STRL REUS W/TWL 2XL LVL3 (GOWN DISPOSABLE) ×6 IMPLANT
GOWN STRL REUS W/TWL LRG LVL3 (GOWN DISPOSABLE) ×6
HANDPIECE INTERPULSE COAX TIP (DISPOSABLE)
IMMOBILIZER KNEE 20 (SOFTGOODS) IMPLANT
IMMOBILIZER KNEE 22 UNIV (SOFTGOODS) ×3 IMPLANT
KIT BASIN OR (CUSTOM PROCEDURE TRAY) ×3 IMPLANT
KIT ROOM TURNOVER OR (KITS) ×3 IMPLANT
MANIFOLD NEPTUNE II (INSTRUMENTS) ×3 IMPLANT
NEEDLE 22X1 1/2 (OR ONLY) (NEEDLE) ×3 IMPLANT
NS IRRIG 1000ML POUR BTL (IV SOLUTION) ×3 IMPLANT
PACK TOTAL JOINT (CUSTOM PROCEDURE TRAY) ×3 IMPLANT
PACK UNIVERSAL I (CUSTOM PROCEDURE TRAY) ×3 IMPLANT
PAD ARMBOARD 7.5X6 YLW CONV (MISCELLANEOUS) ×3 IMPLANT
PRESSURIZER FEMORAL UNIV (MISCELLANEOUS) IMPLANT
SET HNDPC FAN SPRY TIP SCT (DISPOSABLE) IMPLANT
STAPLER VISISTAT 35W (STAPLE) ×2 IMPLANT
SUCTION FRAZIER HANDLE 10FR (MISCELLANEOUS) ×2
SUCTION TUBE FRAZIER 10FR DISP (MISCELLANEOUS) ×1 IMPLANT
SUT BONE WAX W31G (SUTURE) IMPLANT
SUT ETHIBOND NAB CT1 #1 30IN (SUTURE) ×11 IMPLANT
SUT MNCRL AB 3-0 PS2 18 (SUTURE) ×5 IMPLANT
SUT VIC AB 0 CT1 27 (SUTURE) ×6
SUT VIC AB 0 CT1 27XBRD ANBCTR (SUTURE) ×2 IMPLANT
SUT VIC AB 1 CT1 27 (SUTURE) ×6
SUT VIC AB 1 CT1 27XBRD ANBCTR (SUTURE) ×2 IMPLANT
SUT VIC AB 2-0 CT1 27 (SUTURE) ×3
SUT VIC AB 2-0 CT1 TAPERPNT 27 (SUTURE) ×1 IMPLANT
SYR CONTROL 10ML LL (SYRINGE) ×3 IMPLANT
TOWEL OR 17X24 6PK STRL BLUE (TOWEL DISPOSABLE) ×3 IMPLANT
TOWEL OR 17X26 10 PK STRL BLUE (TOWEL DISPOSABLE) ×3 IMPLANT
TOWER CARTRIDGE SMART MIX (DISPOSABLE) IMPLANT
WATER STERILE IRR 1000ML POUR (IV SOLUTION) ×12 IMPLANT
WRAP KNEE MAXI GEL POST OP (GAUZE/BANDAGES/DRESSINGS) ×2 IMPLANT
YANKAUER SUCT BULB TIP NO VENT (SUCTIONS) ×6 IMPLANT

## 2015-12-31 NOTE — Transfer of Care (Signed)
Immediate Anesthesia Transfer of Care Note  Patient: [name]  Procedure(s) Performed: Procedure(s): RIGHT TOTAL HIP ARTHROPLASTY (Right)  Patient Location: PACU  Anesthesia Type:Spinal  Level of Consciousness: awake, alert , oriented and patient cooperative  Airway & Oxygen Therapy: Patient Spontanous Breathing and Patient connected to nasal cannula oxygen  Post-op Assessment: Report given to RN and Post -op Vital signs reviewed and stable  Post vital signs: Reviewed and stable  Last Vitals:  Vitals:   12/31/15 1408 12/31/15 1410  BP: (!) 88/72   Pulse: (!) 59   Resp: 14   Temp:  (!) 35.9 C    Last Pain:  Vitals:   12/31/15 0933  TempSrc: Oral  PainSc:       Patients Stated Pain Goal: 7 (12/31/15 0931)  Complications: No apparent anesthesia complications

## 2015-12-31 NOTE — Anesthesia Preprocedure Evaluation (Addendum)
Anesthesia Evaluation  Patient identified by MRN, date of birth, ID band Patient awake    Reviewed: Allergy & Precautions, NPO status , Patient's Chart, lab work & pertinent test results  Airway Mallampati: II  TM Distance: >3 FB Neck ROM: Full    Dental  (+) Teeth Intact, Dental Advisory Given   Pulmonary former smoker,    breath sounds clear to auscultation       Cardiovascular  Rhythm:Regular Rate:Normal     Neuro/Psych    GI/Hepatic   Endo/Other  diabetes  Renal/GU      Musculoskeletal   Abdominal   Peds  Hematology   Anesthesia Other Findings   Reproductive/Obstetrics                             Anesthesia Physical Anesthesia Plan  ASA: III  Anesthesia Plan: MAC and Spinal   Post-op Pain Management:    Induction: Intravenous  Airway Management Planned: Natural Airway and Simple Face Mask  Additional Equipment:   Intra-op Plan:   Post-operative Plan:   Informed Consent: I have reviewed the patients History and Physical, chart, labs and discussed the procedure including the risks, benefits and alternatives for the proposed anesthesia with the patient or authorized representative who has indicated his/her understanding and acceptance.   Dental advisory given  Plan Discussed with: CRNA and Anesthesiologist  Anesthesia Plan Comments:         Anesthesia Quick Evaluation

## 2015-12-31 NOTE — Op Note (Signed)
PATIENT ID:      [name]  MRN:     [id number] DOB/AGE:    [birth year] / 62 y.o.       OPERATIVE REPORT    DATE OF PROCEDURE:  12/31/2015       PREOPERATIVE DIAGNOSIS:   RIGHT HIP OSTEOARTHRITIS-END STAGE                                                       Estimated body mass index is 32.84 kg/m as calculated from the following:   Height as of 12/21/15: 5\' 8"  (1.727 m).   Weight as of 12/21/15: 98 kg (216 lb).     POSTOPERATIVE DIAGNOSIS:   RIGHT HIP OSTEOARTHRITIS-SAME                                                                   Estimated body mass index is 32.84 kg/m as calculated from the following:   Height as of 12/21/15: 5\' 8"  (1.727 m).   Weight as of 12/21/15: 98 kg (216 lb).     PROCEDURE:  Procedure(s): RIGHT TOTAL HIP ARTHROPLASTY     SURGEON:  [name], MD    ASSISTANT:   [name], PA-C   (Present and scrubbed throughout the case, critical for assistance with exposure, retraction, instrumentation, and closure.)          ANESTHESIA: spinal and IV sedation     DRAINS: none :      TOURNIQUET TIME: * No tourniquets in log *    COMPLICATIONS:  None   CONDITION:  stable  PROCEDURE IN DETAIL: [id number]   [name] 12/31/2015, 1:40 PM

## 2015-12-31 NOTE — H&P (Signed)
  The recent History & Physical has been reviewed. I have personally examined the patient today. There is no interval change to the documented History & Physical. The patient would like to proceed with the procedure.  [name] W 12/31/2015,  11:09 AM

## 2015-12-31 NOTE — Anesthesia Procedure Notes (Signed)
Spinal  Patient location during procedure: OR Staffing Anesthesiologist: Vidyuth Belsito Performed: anesthesiologist  Preanesthetic Checklist Completed: patient identified, site marked, surgical consent, pre-op evaluation, timeout performed, IV checked, risks and benefits discussed and monitors and equipment checked Spinal Block Patient position: sitting Prep: ChloraPrep Patient monitoring: continuous pulse ox, blood pressure and heart rate Approach: midline Location: L3-4 Injection technique: single-shot Needle Needle type: Spinocan  Needle gauge: 24 G Needle length: 9 cm Additional Notes Functioning IV was confirmed and monitors were applied. Sterile prep and drape, including hand hygiene and sterile gloves were used. The patient was positioned and the spine was prepped. The skin was anesthetized with lidocaine.  Free flow of clear CSF was obtained prior to injecting local anesthetic into the CSF.  The spinal needle aspirated freely following injection.  The needle was carefully withdrawn.  The patient tolerated the procedure well.  Allardt Wenzlick, MD      

## 2016-01-01 ENCOUNTER — Encounter (HOSPITAL_COMMUNITY): Payer: Self-pay | Admitting: Orthopaedic Surgery

## 2016-01-01 LAB — BASIC METABOLIC PANEL
ANION GAP: 7 (ref 5–15)
BUN: 13 mg/dL (ref 6–20)
CALCIUM: 8.6 mg/dL — AB (ref 8.9–10.3)
CO2: 24 mmol/L (ref 22–32)
Chloride: 104 mmol/L (ref 101–111)
Creatinine, Ser: 0.84 mg/dL (ref 0.61–1.24)
GFR calc Af Amer: >60 mL/min (ref >60)
GLUCOSE: 109 mg/dL — AB (ref 65–99)
Potassium: 3.9 mmol/L (ref 3.5–5.1)
SODIUM: 135 mmol/L (ref 135–145)

## 2016-01-01 LAB — GLUCOSE, CAPILLARY
Glucose-Capillary: 118 mg/dL — ABNORMAL HIGH (ref 65–99)
Glucose-Capillary: 133 mg/dL — ABNORMAL HIGH (ref 65–99)

## 2016-01-01 LAB — CBC
HCT: 32.5 % — ABNORMAL LOW (ref 39.0–52.0)
Hemoglobin: 11.3 g/dL — ABNORMAL LOW (ref 13.0–17.0)
MCH: 29.5 pg (ref 26.0–34.0)
MCHC: 34.8 g/dL (ref 30.0–36.0)
MCV: 84.9 fL (ref 78.0–100.0)
PLATELETS: 198 10*3/uL (ref 150–400)
RBC: 3.83 MIL/uL — ABNORMAL LOW (ref 4.22–5.81)
RDW: 13.2 % (ref 11.5–15.5)
WBC: 12.6 10*3/uL — AB (ref 4.0–10.5)

## 2016-01-01 LAB — HEMOGLOBIN A1C
Hgb A1c MFr Bld: 6.2 % — ABNORMAL HIGH (ref 4.8–5.6)
Mean Plasma Glucose: 131 mg/dL

## 2016-01-01 MED ORDER — HYDROMORPHONE HCL 2 MG PO TABS: 2.0000 mg | ORAL_TABLET | ORAL | 0 refills | Status: DC | PRN

## 2016-01-01 MED ORDER — HYDROMORPHONE HCL 2 MG PO TABS
2.0000 mg | ORAL_TABLET | ORAL | Status: DC | PRN
  Administered 2016-01-01 (×2): 2 mg via ORAL
  Filled 2016-01-01 (×2): qty 1

## 2016-01-01 MED ORDER — CEPHALEXIN 500 MG PO CAPS: 500.0000 mg | ORAL_CAPSULE | Freq: Four times a day (QID) | ORAL | 0 refills | Status: DC

## 2016-01-01 NOTE — Evaluation (Signed)
Occupational Therapy Evaluation and Discharge Patient Details Name: [name] MRN: [id number] DOB: 11/30/1955 Today's Date: 01/01/2016    History of Present Illness Pt is a pleasant 60 y/o male s/p R THA (posterior precautions). PMH including but not limited to DM, L rotator cuff repair (2015), and C4 discectomy (1998).   Clinical Impression   Pt and wife educated extensively in ADL and related posterior hip precautions, multiple uses of 3 in 1, use of AE, shower transfer and safe footwear. Pt and wife demonstrating understanding of all information. No further OT needs.    Follow Up Recommendations  No OT follow up;Supervision/Assistance - 24 hour    Equipment Recommendations  Tub/shower seat    Recommendations for Other Services       Precautions / Restrictions Precautions Precautions: Posterior Hip;Fall Precaution Booklet Issued: Yes (comment) Precaution Comments: reinforced hip precautions related to ADL Required Braces or Orthoses: Knee Immobilizer - Right (in bed) Restrictions Weight Bearing Restrictions: Yes RLE Weight Bearing: Weight bearing as tolerated      Mobility Bed Mobility      General bed mobility comments: pt in chair  Transfers Overall transfer level: Needs assistance Equipment used: Rolling walker (2 wheeled) Transfers: Sit to/from Stand Sit to Stand: Supervision         General transfer comment: Pt required increased time and VC'ing for technique and bilateral hand positioning    Balance Overall balance assessment: Needs assistance Sitting-balance support: Feet supported;No upper extremity supported Sitting balance-Leahy Scale: Fair     Standing balance support: No upper extremity supported;During functional activity Standing balance-Leahy Scale: Fair                              ADL Overall ADL's : Needs assistance/impaired Eating/Feeding: Independent;Sitting   Grooming: Wash/dry hands;Standing;Supervision/safety    Upper Body Bathing: Set up;Sitting   Lower Body Bathing: Supervison/ safety;Sit to/from stand;Adhering to hip precautions;With adaptive equipment   Upper Body Dressing : Set up;Sitting   Lower Body Dressing: Supervision/safety;Adhering to hip precautions;With adaptive equipment;Sit to/from stand   Toilet Transfer: Supervision/safety;Ambulation;RW;BSC (over toilet)         Tub/Shower Transfer Details (indicate cue type and reason): educated in sequence, use of  3 in 1 vs shower seat   General ADL Comments: Instructed at length in hip precautions related to bed positioning and ADL.     Vision     Perception     Praxis      Pertinent Vitals/Pain Pain Assessment: Faces Pain Score: 7  (during ambulation) Faces Pain Scale: Hurts even more Pain Location: R hip Pain Descriptors / Indicators: Grimacing;Guarding Pain Intervention(s): Monitored during session;Premedicated before session;Repositioned;Ice applied     Hand Dominance Right   Extremity/Trunk Assessment Upper Extremity Assessment Upper Extremity Assessment: Overall WFL for tasks assessed   Lower Extremity Assessment Lower Extremity Assessment: Defer to PT evaluation RLE Deficits / Details: Pt with decreased strength and ROM limitations secondary to post-op. Sensation grossly intact.       Communication Communication Communication: No difficulties   Cognition Arousal/Alertness: Awake/alert Behavior During Therapy: WFL for tasks assessed/performed Overall Cognitive Status: Within Functional Limits for tasks assessed                     General Comments       Exercises      Shoulder Instructions      Home Living Family/patient expects to be discharged to:: Private residence  Living Arrangements: Spouse/significant other Available Help at Discharge: Family;Available 24 hours/day Type of Home: House Home Access: Stairs to enter [employer] of Steps: 1 Entrance Stairs-Rails:  None Home Layout: Two level;Able to live on main level with bedroom/bathroom Alternate Level Stairs-Number of Steps: 13 Alternate Level Stairs-Rails: Left Bathroom Shower/Tub: Producer, television/film/video: Standard     Home Equipment: Cane - quad;Bedside commode;Grab bars - tub/shower;Hand held shower head          Prior Functioning/Environment Level of Independence: Independent with assistive device(s)        Comments: Pt was utilizing cane during ambulation secondary to pain since approximately 3 weeks ago.    OT Diagnosis: Generalized weakness;Acute pain   OT Problem List:     OT Treatment/Interventions:      OT Goals(Current goals can be found in the care plan section) Acute Rehab OT Goals Patient Stated Goal: return home  OT Frequency:     Barriers to D/C:            Co-evaluation              End of Session Equipment Utilized During Treatment: Gait belt;Rolling walker  Activity Tolerance: Patient tolerated treatment well Patient left: in chair;with call bell/phone within reach;with family/visitor present   Time: 1100-1137 OT Time Calculation (min): 37 min Charges:  OT General Charges $OT Visit: 1 Procedure OT Evaluation $OT Eval Low Complexity: 1 Procedure OT Treatments $Self Care/Home Management : 23-37 mins G-Codes:    Evern Bio 01/01/2016, 1:05 PM  [phone]

## 2016-01-01 NOTE — Discharge Instructions (Signed)

## 2016-01-01 NOTE — Op Note (Signed)
[name], John NO.:  Coffey  MEDICAL RECORD NO.:  [id number]  LOCATION:  [id number]                        FACILITY:  [facility]  PHYSICIAN:  Vonna Kotyk. Whitfield, M.D.DATE OF BIRTH:  06-09-1955  DATE OF PROCEDURE: DATE OF DISCHARGE:                              OPERATIVE REPORT   PREOPERATIVE DIAGNOSIS:  End-stage osteoarthritis, right hip.  POSTOPERATIVE DIAGNOSIS:  End-stage osteoarthritis, right hip.  PROCEDURE:  Right total hip replacement.  SURGEON:  Vonna Kotyk. Durward Fortes, MD.  ASSISTANTAaron Edelman D. Petrarca. PA-C.  ANESTHESIA:  Spinal with supplemental IV sedation.  COMPLICATIONS:  None.  COMPONENTS:  DePuy AML 15 mm large stature femoral component, a metallic 36 mm outer diameter hip ball with a 1.5 mm neck length, 52 mm outer diameter Gription 3 acetabulum component with an apex hole eliminator, and a Marathon polyethylene liner +4 with a 10-degree posterior lip. Components were press-fit.  DESCRIPTION OF PROCEDURE:  [name] was met with his wife in the holding area.  I identified the right hip as the appropriate operative site and marked it accordingly.  The patient was then transported to room #8 and placed under spinal anesthesia without difficulty with IV sedation.  He was then placed in the lateral decubitus position with the right side up and secured to the operating room table with the Innomed hip system.  Timeout was called.  The right hip and lower extremity were then prepped with chlorhexidine scrub and DuraPrep x2 from iliac crest to the ankle.  Sterile draping was performed.  Time-out was called again.  A routine Southern incision was utilized and via sharp dissection, carried down to subcutaneous tissue.  Gross bleeders were Bovie coagulated.  Adipose tissue was incised to the fascia of the gluteal muscles.  These were then manually separated.  Self-retaining retractors were placed more deeply.  There was abundant amount of  submuscular adipose tissue that was released from the posterior attachment of the greater trochanter.  Self-retaining retractors were placed more deeply. With the adipose tissue retracted, the short external rotators were identified.  Tendinous structures were tagged with 0 Ethibond suture and then all were released from the posterior aspect of the greater trochanter.  The capsule was identified and incised along the femoral neck and head.  There was a small clear yellow joint effusion.  Head was then dislocated posteriorly and amputated with an oscillating saw using the calcar guide.  Head was inspected.  There was obvious significant cartilage loss along its superior surface consistent with the osteoarthritis.  A starter hole for reaming was then placed in the piriformis fossa. Reaming was performed to a 14.5 mm to accept a 15 mm component.  I sequentially reamed from 12 to 13.5 and then 15 mm rasp and felt like that the large stature would be more appropriate and a better fit.  The level of the calcar cut was about a fingerbreadth proximal to the lesser trochanter.  The calcar cutter was used to obtain the appropriate calcar angle.  Retractors were then placed about the acetabulum.  The capsule was tagged and retracted anteriorly and posteriorly.  The labrum was sharply excised.  We reamed sequentially  to 51 mm to accept a 52 component.  I trialed a 51.  It would completely seat with a good rim fit.  The 52 would not completely seat.  The Gription 3 acetabular component with 52 mm outer diameter was then impacted in place using external guide.  Thought we had excellent position.  Wound was irrigated with saline solution.  We placed a small amount of topical tranexamic acid into the wound.  The trial acetabular polyethylene component was then inserted followed by the large stature 15 mm femoral rasp and a 1.5 mm neck length and a 36 mm femoral head. Entire construct was reduced  and through a full range of motion, we had excellent stability.  There was no toggling and I thought the leg lengths were symmetrical.  Trial components were removed.  I inserted an apex hole eliminator and the final marathon polyethylene liner into the acetabulum.  The femoral component was then carefully impacted into the femoral canal flush on the femoral neck and cleaned the Morse taper neck and applied the final metallic hip ball, a 36 mm outer diameter with a 1.5 mm neck length.  We inspected the acetabulum which was clean and then reduced the entire construct, and again through a full range of motion, we could not sublux the hip.  There was no instability.  I did check the sciatic nerve throughout the procedure and thought it was well out of harm's way.  Wound was irrigated with saline solution and then we inserted the tranexamic acid.  Capsule was closed anatomically with #1 Ethibond, short external rotators closed with the similar material.  The fascia over the gluteal muscles were closed with running 0 Vicryl, subcu in several layers with 0 Vicryl and 3-0 Monocryl, skin closed with skin clips.  Sterile bulky dressing was applied.  The patient was then placed supine.  Knee immobilizer applied to the right lower extremity.  The patient was then returned to the postanesthesia recovery room without complications.  I was notified at the end of the procedure that the humidity in the room had been elevated and that it would be considered a contaminated case. We plan on giving extra doses of antibiotics.     Vonna Kotyk. Durward Fortes, M.D.     PWW/MEDQ  D:  12/31/2015  T:  12/31/2015  Job:  [id number]

## 2016-01-01 NOTE — Care Management Note (Signed)
Case Management Note  Patient Details  Name: [name] MRN: [id number] Date of Birth: 10-07-1955  Subjective/Objective:  60 yr old gentleman s/p right total hip arthroplasty.                  Action/Plan: Case manager spoke with patient's wife concerning home health and DME needs. Choice was offered for Home Health Agency. Referral was called to Beatriz Stallion, [facility]..[name] asked if they could get PT person namedd [facility].Wife wants to be called to arrange time for therapy visits [phone],  CM relayed this request. Patient has 3in1 at home, RW has been ordered.    Expected Discharge Date:    01/01/16              Expected Discharge Plan:  Home w Home Health Services  In-House Referral:  NA  Discharge planning Services  CM Consult  Post Acute Care Choice:  Durable Medical Equipment, Home Health Choice offered to:  Spouse  DME Arranged:  Dan Humphreys rolling DME Agency:     HH Arranged:  PT HH Agency:  [facility]  Status of Service:  Completed, signed off  If discussed at Long Length of Stay Meetings, dates discussed:    Additional Comments:  Durenda Guthrie, RN 01/01/2016, 11:40 AM

## 2016-01-01 NOTE — Discharge Summary (Signed)
[name], MD   [name], PA-C [address], [place], [place]  [address]                             [phone]  PATIENT ID: [name]        MRN:  [id number]          DOB/AGE: 10/09/1955 / 60 y.o.    DISCHARGE SUMMARY  ADMISSION DATE:    12/31/2015 DISCHARGE DATE:   01/01/2016   ADMISSION DIAGNOSIS: RIGHT HIP OSTEOARTHRITIS    DISCHARGE DIAGNOSIS:  RIGHT HIP OSTEOARTHRITIS    ADDITIONAL DIAGNOSIS: Principal Problem:   Primary osteoarthritis of right hip Active Problems:   S/P total hip arthroplasty  Past Medical History:  Diagnosis Date  . Arthritis   . Diabetes mellitus without complication (HCC)   . GERD (gastroesophageal reflux disease)    occ  . Hepatitis    12 yrs A water  . Hypertension   . Hypothyroidism     PROCEDURE: Procedure(s): RIGHT TOTAL HIP ARTHROPLASTY  on 12/31/2015  CONSULTS: none    HISTORY: Mr. Vencill is a 60 year old white male who is seen today for evaluation of his right hip. He had an insidious onset of pain in both of his hips, right greater than left, in the fall of 2016 without any history of injury or trauma. He had noticed that more and more he was having problems standing and walking and with groin pain. He is a [name], broadcasting/film/video. Apparently it had gotten to the point where he was seen in [facility] by Dr. Thamas Jaegers and in April was given a corticosteroid injection into his right hip under fluoroscopy which helped for a very short period of time. Apparently it was then decided to replace his hip but was unable to have surgery until August, and he wanted it before his school started. We have seen him previously for rotator cuff repair, and he had done well with that. He is now to the point where he cannot sleep and he has pain all the time. He has difficulty with walking and actually has to use a cane. He has tried a host of medications, especially the NSAIDs, including that of Lodine, Celebrex, Aleve, aspirin, and also the use of  Tylenol. Unfortunately, nothing is working for him. He comes in today with significant pain and discomfort and difficulty with ambulation  HOSPITAL COURSE:  [name] MCCUISTON is a 60 y.o. admitted on 12/31/2015 and found to have a diagnosis of RIGHT HIP OSTEOARTHRITIS.  After appropriate laboratory studies were obtained  they were taken to the operating room on 12/31/2015 and underwent  Procedure(s): RIGHT TOTAL HIP ARTHROPLASTY  .   They were given perioperative antibiotics:  Anti-infectives    Start     Dose/Rate Route Frequency Ordered Stop   01/01/16 0000  cephALEXin (KEFLEX) 500 MG capsule     500 mg Oral 4 times daily 01/01/16 0849     12/31/15 1715  ceFAZolin (ANCEF) IVPB 2g/100 mL premix     2 g 200 mL/hr over 30 Minutes Intravenous Every 6 hours 12/31/15 1705 12/31/15 2340   12/31/15 1000  ceFAZolin (ANCEF) IVPB 2g/100 mL premix     2 g 200 mL/hr over 30 Minutes Intravenous To ShortStay Surgical 12/30/15 1044 12/31/15 1150    .  Tolerated the procedure well.  Toradol was given post op.  POD #1, allowed out of bed to a chair.  PT for ambulation and exercise program.   IV saline locked.  O2 discontionued.  Pain meds switched to Dilaudid.  Placed on Keflex because of humidity problem in OR room.  Patient desired discharge to home  The remainder of the hospital course was dedicated to ambulation and strengthening.   The patient was discharged on 1 Day Post-Op in  Stable condition.  Blood products given:none  DIAGNOSTIC STUDIES: Recent vital signs:  Patient Vitals for the past 24 hrs:  BP Temp Temp src Pulse Resp SpO2  01/01/16 0500 116/68 97.9 F (36.6 C) Oral 81 16 100 %  12/31/15 1952 112/71 98.2 F (36.8 C) Oral 72 14 100 %  12/31/15 1858 114/67 - - 78 - 98 %  12/31/15 1624 105/72 - - (!) 57 14 98 %  12/31/15 1608 107/77 - - (!) 56 18 99 %  12/31/15 1553 99/69 - - (!) 50 16 100 %  12/31/15 1550 99/69 - - (!) 53 16 98 %  12/31/15 1535 99/73 - - - - -  12/31/15 1520 91/71 -  - (!) 50 16 97 %  12/31/15 1453 93/75 - - (!) 50 16 99 %  12/31/15 1450 93/75 - - - - -  12/31/15 1445 - - - 60 13 99 %  12/31/15 1440 90/67 - - (!) 50 16 99 %  12/31/15 1438 90/67 - - (!) 50 15 98 %  12/31/15 1423 91/73 - - (!) 51 15 98 %  12/31/15 1419 (!) 86/72 - - (!) 59 18 98 %  12/31/15 1410 (!) 88/72 97.2 F (36.2 C) - (!) 55 16 -  12/31/15 1408 (!) 88/72 - - (!) 59 14 97 %  12/31/15 0933 130/78 - Oral 67 20 98 %       Recent laboratory studies:  Recent Labs  12/25/15 1018 01/01/16 0418  WBC 11.5* 12.6*  HGB 15.2 11.3*  HCT 45.0 32.5*  PLT 252 198    Recent Labs  12/25/15 1018 01/01/16 0418  NA 135 135  K 4.3 3.9  CL 104 104  CO2 26 24  BUN 15 13  CREATININE 0.82 0.84  GLUCOSE 223* 109*  CALCIUM 9.4 8.6*   Lab Results  Component Value Date   INR 1.05 12/21/2015     Recent Radiographic Studies :  Dg Chest 2 View  Result Date: 12/21/2015 CLINICAL DATA:  Preoperative study prior to right total hip arthroplasty. EXAM: CHEST  2 VIEW COMPARISON:  Chest x-ray of August 27, 2015 and chest CT scan of September 03, 2015 FINDINGS: The lungs are adequately inflated. The interstitial markings are coarse but stable. The heart and pulmonary vascularity are normal. The mediastinum is normal in width. There is calcification in the wall of the aortic arch. There is no pleural effusion. The bony thorax exhibits no acute abnormality. IMPRESSION: Mild chronic bronchitic changes. Aortic atherosclerosis. No acute cardiopulmonary abnormality. Electronically Signed   By: David  [place] M.D.   On: 12/21/2015 11:52    DISCHARGE INSTRUCTIONS: Discharge Instructions    Call MD / Call 911    Complete by:  As directed   If you experience chest pain or shortness of breath, CALL 911 and be transported to the hospital emergency room.  If you develope a fever above 101 F, pus (white drainage) or increased drainage or redness at the wound, or calf pain, call your surgeon's office.   Change dressing     Complete by:  As directed   DO NOT  CHANGE DRESSING. KEEP ON TILL SEEN IN THE OFFICE   Constipation Prevention    Complete by:  As directed   Drink plenty of fluids.  Prune juice may be helpful.  You may use a stool softener, such as Colace (over the counter) 100 mg twice a day.  Use MiraLax (over the counter) for constipation as needed.   Diet general    Complete by:  As directed   Discharge instructions    Complete by:  As directed   INSTRUCTIONS AFTER JOINT REPLACEMENT   Remove items at home which could result in a fall. This includes throw rugs or furniture in walking pathways ICE to the affected joint every three hours while awake for 30 minutes at a time, for at least the first 3-5 days, and then as needed for pain and swelling.  Continue to use ice for pain and swelling. You may notice swelling that will progress down to the foot and ankle.  This is normal after surgery.  Elevate your leg when you are not up walking on it.   Continue to use the breathing machine you got in the hospital (incentive spirometer) which will help keep your temperature down.  It is common for your temperature to cycle up and down following surgery, especially at night when you are not up moving around and exerting yourself.  The breathing machine keeps your lungs expanded and your temperature down.   DIET:  As you were doing prior to hospitalization, we recommend a well-balanced diet.  DRESSING / WOUND CARE / SHOWERING  Keep the surgical dressing until follow up.  The dressing is water proof, so you can shower without any extra covering.  IF THE DRESSING FALLS OFF or the wound gets wet inside, change the dressing with sterile gauze.  Please use good hand washing techniques before changing the dressing.  Do not use any lotions or creams on the incision until instructed by your surgeon.    ACTIVITY  Increase activity slowly as tolerated, but follow the weight bearing instructions below.   No driving for 6 weeks  or until further direction given by your physician.  You cannot drive while taking narcotics.  No lifting or carrying greater than 10 lbs. until further directed by your surgeon. Avoid periods of inactivity such as sitting longer than an hour when not asleep. This helps prevent blood clots.  You may return to work once you are authorized by your doctor.     WEIGHT BEARING   Weight bearing as tolerated with assist device (walker, cane, etc) as directed, use it as long as suggested by your surgeon or therapist, typically at least 4-6 weeks.   EXERCISES  Results after joint replacement surgery are often greatly improved when you follow the exercise, range of motion and muscle strengthening exercises prescribed by your doctor. Safety measures are also important to protect the joint from further injury. Any time any of these exercises cause you to have increased pain or swelling, decrease what you are doing until you are comfortable again and then slowly increase them. If you have problems or questions, call your caregiver or physical therapist for advice.   Rehabilitation is important following a joint replacement. After just a few days of immobilization, the muscles of the leg can become weakened and shrink (atrophy).  These exercises are designed to build up the tone and strength of the thigh and leg muscles and to improve motion. Often times heat used for twenty to thirty minutes before  working out will loosen up your tissues and help with improving the range of motion but do not use heat for the first two weeks following surgery (sometimes heat can increase post-operative swelling).   These exercises can be done on a training (exercise) mat, on a table or on a bed. Use whatever works the best and is most comfortable for you.    Use music or television while you are exercising so that the exercises are a pleasant break in your day. This will make your life better with the exercises acting as a break  in your routine that you can look forward to.   Perform all exercises about fifteen times, three times per day or as directed.  You should exercise both the operative leg and the other leg as well.   Exercises include:  Quad Sets - Tighten up the muscle on the front of the thigh (Quad) and hold for 5-10 seconds.   Straight Leg Raises - With your knee straight (if you were given a brace, keep it on), lift the leg to 60 degrees, hold for 3 seconds, and slowly lower the leg.  Perform this exercise against resistance later as your leg gets stronger.  Leg Slides: Lying on your back, slowly slide your foot toward your buttocks, bending your knee up off the floor (only go as far as is comfortable). Then slowly slide your foot back down until your leg is flat on the floor again.  Angel Wings: Lying on your back spread your legs to the side as far apart as you can without causing discomfort.  Hamstring Strength:  Lying on your back, push your heel against the floor with your leg straight by tightening up the muscles of your buttocks.  Repeat, but this time bend your knee to a comfortable angle, and push your heel against the floor.  You may put a pillow under the heel to make it more comfortable if necessary.   A rehabilitation program following joint replacement surgery can speed recovery and prevent re-injury in the future due to weakened muscles. Contact your doctor or a physical therapist for more information on knee rehabilitation.    CONSTIPATION  Constipation is defined medically as fewer than three stools per week and severe constipation as less than one stool per week.  Even if you have a regular bowel pattern at home, your normal regimen is likely to be disrupted due to multiple reasons following surgery.  Combination of anesthesia, postoperative narcotics, change in appetite and fluid intake all can affect your bowels.   YOU MUST use at least one of the following options; they are listed in order  of increasing strength to get the job done.  They are all available over the counter, and you may need to use some, POSSIBLY even all of these options:    Drink plenty of fluids (prune juice may be helpful) and high fiber foods Colace 100 mg by mouth twice a day  Senokot for constipation as directed and as needed Dulcolax (bisacodyl), take with full glass of water  Miralax (polyethylene glycol) once or twice a day as needed.  If you have tried all these things and are unable to have a bowel movement in the first 3-4 days after surgery call either your surgeon or your primary doctor.    If you experience loose stools or diarrhea, hold the medications until you stool forms back up.  If your symptoms do not get better within 1 week or if they  get worse, check with your doctor.  If you experience "the worst abdominal pain ever" or develop nausea or vomiting, please contact the office immediately for further recommendations for treatment.   ITCHING:  If you experience itching with your medications, try taking only a single pain pill, or even half a pain pill at a time.  You can also use Benadryl over the counter for itching or also to help with sleep.   TED HOSE STOCKINGS:  Use stockings on both legs until for at least 2 weeks or as directed by physician office. They may be removed at night for sleeping.  MEDICATIONS:  See your medication summary on the "After Visit Summary" that nursing will review with you.  You may have some home medications which will be placed on hold until you complete the course of blood thinner medication.  It is important for you to complete the blood thinner medication as prescribed.  PRECAUTIONS:  If you experience chest pain or shortness of breath - call 911 immediately for transfer to the hospital emergency department.   If you develop a fever greater that 101 F, purulent drainage from wound, increased redness or drainage from wound, foul odor from the wound/dressing, or  calf pain - CONTACT YOUR SURGEON.                                                   FOLLOW-UP APPOINTMENTS:  If you do not already have a post-op appointment, please call the office for an appointment to be seen by your surgeon.  Guidelines for how soon to be seen are listed in your "After Visit Summary", but are typically between 1-4 weeks after surgery.  OTHER INSTRUCTIONS:   Knee Replacement:  Do not place pillow under knee, focus on keeping the knee straight while resting. CPM instructions: 0-90 degrees, 2 hours in the morning, 2 hours in the afternoon, and 2 hours in the evening. Place foam block, curve side up under heel at all times except when in CPM or when walking.  DO NOT modify, tear, cut, or change the foam block in any way.  MAKE SURE YOU:  Understand these instructions.  Get help right away if you are not doing well or get worse.    Thank you for letting us be a part of your medical care team.  It is a privilege we respect greatly.  We hope these instructions will help you stay on track for a fast and full recovery!   Driving restrictions    Complete by:  As directed   No driving for 6 weeks   Follow the hip precautions as taught in Physical Therapy    Complete by:  As directed   Increase activity slowly as tolerated    Complete by:  As directed   Lifting restrictions    Complete by:  As directed   No lifting for 6 weeks   Patient may shower    Complete by:  As directed   YOU MAY SHOWER OVER THE DRESSING.  DO NOT REMOVE DRESSING   TED hose    Complete by:  As directed   Use stockings (TED hose) for 2 weeks on RIGHT leg(s).  You may remove them at night for sleeping.   Weight bearing as tolerated    Complete by:  As directed   Laterality:  right   Extremity:  Lower      DISCHARGE MEDICATIONS:     Medication List    STOP taking these medications   aspirin 81 MG tablet   Fish Oil 1000 MG Caps   glucose blood test strip Commonly known as:  ONE TOUCH ULTRA TEST     ibuprofen 200 MG tablet Commonly known as:  ADVIL,MOTRIN   sildenafil 100 MG tablet Commonly known as:  VIAGRA   tadalafil 5 MG tablet Commonly known as:  CIALIS   traMADol 50 MG tablet Commonly known as:  ULTRAM     TAKE these medications   cephALEXin 500 MG capsule Commonly known as:  KEFLEX Take 1 capsule (500 mg total) by mouth 4 (four) times daily.   clobetasol cream 0.05 % Commonly known as:  TEMOVATE Apply 1 application topically 2 (two) times daily.   EPIPEN 2-PAK 0.3 mg/0.3 mL Soaj injection Generic drug:  EPINEPHrine USE AS DIRECTED   glipiZIDE 5 MG tablet Commonly known as:  GLUCOTROL Take 5 mg by mouth daily before breakfast.   HYDROmorphone 2 MG tablet Commonly known as:  DILAUDID Take 1 tablet (2 mg total) by mouth every 4 (four) hours as needed for severe pain (1 - 2 TABLETS Q 4H PRN PAIN).   levothyroxine 75 MCG tablet Commonly known as:  SYNTHROID, LEVOTHROID Take 75 mcg by mouth daily before breakfast.   lisinopril 5 MG tablet Commonly known as:  PRINIVIL,ZESTRIL Take 1 tablet (5 mg total) by mouth daily. What changed:  how much to take   metFORMIN 500 MG tablet Commonly known as:  GLUCOPHAGE Take 1 tablet (500 mg total) by mouth 2 (two) times daily with a meal. What changed:  how much to take   methocarbamol 500 MG tablet Commonly known as:  ROBAXIN Take 1 tablet (500 mg total) by mouth every 6 (six) hours as needed for muscle spasms.   metoprolol tartrate 25 MG tablet Commonly known as:  LOPRESSOR Take 25 mg by mouth daily.   multivitamin tablet Take 1 tablet by mouth daily.   rivaroxaban 10 MG Tabs tablet Commonly known as:  XARELTO Take 1 tablet (10 mg total) by mouth daily with breakfast.   triamcinolone cream 0.1 % Commonly known as:  KENALOG Apply 1 application topically 2 (two) times daily.   Vitamin D3 2000 units capsule Take 2,000 Units by mouth daily.       FOLLOW UP VISIT:   Follow-up Information    WHITFIELD,  Claude Manges, MD. Schedule an appointment as soon as possible for a visit on 01/11/2016.   Specialty:  Orthopedic Surgery Contact information: [address] [place] [address] [phone]           DISPOSITION:   Home  CONDITION:  Stable   Oris Drone. Aleda Grana [facility] [phone]  01/01/2016 9:06 AM

## 2016-01-01 NOTE — Progress Notes (Signed)
Physical Therapy Treatment Patient Details Name: [name] MRN: [id number] DOB: 1955-10-03 Today's Date: 01/01/2016    History of Present Illness Pt is a pleasant 60 y/o male s/p R THA (posterior precautions). PMH including but not limited to DM, L rotator cuff repair (2015), and C4 discectomy (1998).    PT Comments    Pt presented sitting in recliner with spouse present in room. Pt able to successfully complete stair training with wife present as well. Pt would continue to benefit from skilled physical therapy services at this time while admitted and after d/c to address his limitations in order to improve his overall safety and independence with functional mobility.   Follow Up Recommendations  Home health PT;Supervision for mobility/OOB     Equipment Recommendations  Rolling walker with 5" wheels    Recommendations for Other Services       Precautions / Restrictions Precautions Precautions: Posterior Hip;Fall Precaution Booklet Issued: Yes (comment) Precaution Comments: PT discussed precautions and positioning following THA with pt and pt's wife. Required Braces or Orthoses: Knee Immobilizer - Right (when in bed, not on CPM) Restrictions Weight Bearing Restrictions: Yes RLE Weight Bearing: Weight bearing as tolerated    Mobility  Bed Mobility               General bed mobility comments: pt OOB in recliner when PT arrived  Transfers Overall transfer level: Needs assistance Equipment used: Rolling walker (2 wheeled) Transfers: Sit to/from Stand Sit to Stand: Supervision         General transfer comment: Pt required increased time and VC'ing for technique and bilateral hand positioning  Ambulation/Gait Ambulation/Gait assistance: Min guard Ambulation Distance (Feet): 20 Feet (20 ft x2 with stair training in between) Assistive device: Rolling walker (2 wheeled) Gait Pattern/deviations: Step-to pattern;Decreased step length - left;Decreased stance time -  right;Decreased weight shift to right Gait velocity: decreased Gait velocity interpretation: Below normal speed for age/gender     Stairs Stairs: Yes Stairs assistance: Min guard Stair Management: One rail Left Number of Stairs: 13 General stair comments: Pt's wife was present throughout stair training. Pt practiced stair training with use of L unilateral handrail as they have within their home, as well as ascending/descending one step with use of RW as they have one step to enter the home with no handrails.  Wheelchair Mobility    Modified Rankin (Stroke Patients Only)       Balance Overall balance assessment: Needs assistance Sitting-balance support: Feet supported;No upper extremity supported Sitting balance-Leahy Scale: Fair     Standing balance support: Bilateral upper extremity supported;During functional activity Standing balance-Leahy Scale: Poor                      Cognition Arousal/Alertness: Awake/alert Behavior During Therapy: WFL for tasks assessed/performed Overall Cognitive Status: Within Functional Limits for tasks assessed                      Exercises      General Comments        Pertinent Vitals/Pain Pain Assessment: 0-10 Pain Score: 4  Faces Pain Scale: Hurts even more Pain Location: R hip  Pain Descriptors / Indicators: Grimacing;Moaning;Operative site guarding Pain Intervention(s): Monitored during session;Repositioned    Home Living Family/patient expects to be discharged to:: Private residence Living Arrangements: Spouse/significant other Available Help at Discharge: Family;Available 24 hours/day Type of Home: House Home Access: Stairs to enter Entrance Stairs-Rails: None Home Layout: Two level;Able to  live on main level with bedroom/bathroom Home Equipment: Cane - quad;Bedside commode;Grab bars - tub/shower;Hand held shower head      Prior Function Level of Independence: Independent with assistive device(s)       Comments: Pt was utilizing cane during ambulation secondary to pain since approximately 3 weeks ago.   PT Goals (current goals can now be found in the care plan section) Acute Rehab PT Goals Patient Stated Goal: return home PT Goal Formulation: With patient/family Time For Goal Achievement: 01/08/16 Potential to Achieve Goals: Good Progress towards PT goals: Progressing toward goals    Frequency  7X/week    PT Plan Current plan remains appropriate    Co-evaluation             End of Session Equipment Utilized During Treatment: Gait belt Activity Tolerance: Patient tolerated treatment well Patient left: in chair;with call bell/phone within reach;with family/visitor present     Time: 1215-1243 PT Time Calculation (min) (ACUTE ONLY): 28 min  Charges:  $Gait Training: 23-37 mins                    G CodesAlessandra Bevels Bronda Alfred Jan 31, 2016, 2:18 PM Deborah Chalk, PT, DPT [phone]

## 2016-01-01 NOTE — Evaluation (Signed)
Physical Therapy Evaluation Patient Details Name: [name] MRN: [id number] DOB: June 11, 1955 Today's Date: 01/01/2016   History of Present Illness  Pt is a pleasant 60 y/o male s/p R THA (posterior precautions). PMH including but not limited to DM, L rotator cuff repair (2015), and C4 discectomy (1998).  Clinical Impression  Pt presented supine in bed with HOB elevated, knee immobilizer in place on R LE. Pt's wife was present throughout session. Pt was able to perform bed mobility with min A with movement of R LE. He ambulated with min guard for safety only, but required frequent VC'ing for sequencing and safety with use of RW. Pt would continue to benefit from skilled physical therapy services at this time while admitted and after d/c to address his below listed limitations in order to improve his overall safety and independence with functional mobility. PT plan to complete stair training at next session.     Follow Up Recommendations Home health PT;Supervision for mobility/OOB    Equipment Recommendations  Rolling walker with 5" wheels    Recommendations for Other Services       Precautions / Restrictions Precautions Precautions: Posterior Hip;Fall Precaution Booklet Issued: Yes (comment) Precaution Comments: PT discussed precautions and positioning following THA with pt and pt's wife. Required Braces or Orthoses: Knee Immobilizer - Right (when in bed, not on CPM) Restrictions Weight Bearing Restrictions: Yes RLE Weight Bearing: Weight bearing as tolerated      Mobility  Bed Mobility Overal bed mobility: Needs Assistance Bed Mobility: Supine to Sit     Supine to sit: Min assist;HOB elevated (min A with R LE)     General bed mobility comments: Pt required increased time to complete task  Transfers Overall transfer level: Needs assistance Equipment used: Rolling walker (2 wheeled) Transfers: Sit to/from Stand Sit to Stand: Min guard         General transfer  comment: Pt required increased time and VC'ing for technique and bilateral hand positioning  Ambulation/Gait Ambulation/Gait assistance: Min guard Ambulation Distance (Feet): 50 Feet Assistive device: Rolling walker (2 wheeled) Gait Pattern/deviations: Step-to pattern;Decreased step length - left;Decreased stance time - right;Decreased weight shift to right Gait velocity: decreased Gait velocity interpretation: Below normal speed for age/gender    Stairs            Wheelchair Mobility    Modified Rankin (Stroke Patients Only)       Balance Overall balance assessment: Needs assistance Sitting-balance support: Feet supported;No upper extremity supported Sitting balance-Leahy Scale: Fair     Standing balance support: No upper extremity supported;During functional activity Standing balance-Leahy Scale: Fair                               Pertinent Vitals/Pain Pain Assessment: 0-10 Pain Score: 7  (during ambulation) Pain Location: R hip and R thigh Pain Descriptors / Indicators: Sharp;Operative site guarding Pain Intervention(s): Limited activity within patient's tolerance;Monitored during session;Repositioned    Home Living Family/patient expects to be discharged to:: Private residence Living Arrangements: Spouse/significant other Available Help at Discharge: Family;Available 24 hours/day Type of Home: House Home Access: Stairs to enter Entrance Stairs-Rails: None Entrance Stairs-Number of Steps: 1 Home Layout: Two level;Able to live on main level with bedroom/bathroom Home Equipment: Cane - quad      Prior Function Level of Independence: Independent with assistive device(s)         Comments: Pt was utilizing cane during ambulation secondary to  pain since approximately 3 weeks ago.     Hand Dominance        Extremity/Trunk Assessment   Upper Extremity Assessment: Overall WFL for tasks assessed           Lower Extremity Assessment:  RLE deficits/detail RLE Deficits / Details: Pt with decreased strength and ROM limitations secondary to post-op. Sensation grossly intact.       Communication   Communication: No difficulties  Cognition Arousal/Alertness: Awake/alert Behavior During Therapy: WFL for tasks assessed/performed Overall Cognitive Status: Within Functional Limits for tasks assessed                      General Comments      Exercises Total Joint Exercises Ankle Circles/Pumps: AROM;Strengthening;Both;10 reps;Seated Hip ABduction/ADduction: AROM;Strengthening;Right;10 reps;Seated Long Arc Quad: AROM;Strengthening;Right;10 reps      Assessment/Plan    PT Assessment Patient needs continued PT services  PT Diagnosis Difficulty walking   PT Problem List Decreased strength;Decreased range of motion;Decreased activity tolerance;Decreased balance;Decreased coordination;Decreased mobility;Decreased knowledge of use of DME;Pain  PT Treatment Interventions DME instruction;Gait training;Stair training;Functional mobility training;Therapeutic activities;Therapeutic exercise;Balance training;Neuromuscular re-education;Patient/family education   PT Goals (Current goals can be found in the Care Plan section) Acute Rehab PT Goals Patient Stated Goal: return home PT Goal Formulation: With patient/family Time For Goal Achievement: 01/08/16 Potential to Achieve Goals: Good    Frequency 7X/week   Barriers to discharge        Co-evaluation               End of Session Equipment Utilized During Treatment: Gait belt Activity Tolerance: Patient limited by fatigue;Patient limited by pain Patient left: in chair;with call bell/phone within reach;with family/visitor present Nurse Communication: Mobility status         Time: 1610-9604 PT Time Calculation (min) (ACUTE ONLY): 35 min   Charges:   PT Evaluation $PT Eval Moderate Complexity: 1 Procedure PT Treatments $Gait Training: 8-22 mins   PT  G CodesAlessandra Bevels Shaden Higley 01/01/2016, 10:59 AM Deborah Chalk, PT, DPT [phone]

## 2016-01-01 NOTE — Progress Notes (Signed)
Patient ID: [name], male   DOB: 06/01/1956, 60 y.o.   MRN: [id number] PATIENT ID: [name]        MRN:  [id number]          DOB/AGE: 20-Dec-1955 / 60 y.o.    John Campbell, MD   [name], PA-C [address] [place], [place]  [address]                             [phone]   PROGRESS NOTE  Subjective:  negative for Chest Pain  negative for Shortness of Breath  negative for Nausea/Vomiting   negative for Calf Pain    Tolerating Diet: yes         Patient reports pain as mild.     Comfortable night-fine this am  Objective: Vital signs in last 24 hours:   Patient Vitals for the past 24 hrs:  BP Temp Temp src Pulse Resp SpO2  01/01/16 0500 116/68 97.9 F (36.6 C) Oral 81 16 100 %  12/31/15 1952 112/71 98.2 F (36.8 C) Oral 72 14 100 %  12/31/15 1858 114/67 - - 78 - 98 %  12/31/15 1624 105/72 - - (!) 57 14 98 %  12/31/15 1608 107/77 - - (!) 56 18 99 %  12/31/15 1553 99/69 - - (!) 50 16 100 %  12/31/15 1550 99/69 - - (!) 53 16 98 %  12/31/15 1535 99/73 - - - - -  12/31/15 1520 91/71 - - (!) 50 16 97 %  12/31/15 1453 93/75 - - (!) 50 16 99 %  12/31/15 1450 93/75 - - - - -  12/31/15 1445 - - - 60 13 99 %  12/31/15 1440 90/67 - - (!) 50 16 99 %  12/31/15 1438 90/67 - - (!) 50 15 98 %  12/31/15 1423 91/73 - - (!) 51 15 98 %  12/31/15 1419 (!) 86/72 - - (!) 59 18 98 %  12/31/15 1410 (!) 88/72 97.2 F (36.2 C) - (!) 55 16 -  12/31/15 1408 (!) 88/72 - - (!) 59 14 97 %  12/31/15 0933 130/78 - Oral 67 20 98 %      Intake/Output from previous day:   07/27 0701 - 07/28 0700 In: 2113.8 [I.V.:1863.8] Out: 1250 [Urine:250]   Intake/Output this shift:   No intake/output data recorded.   Intake/Output      07/27 0701 - 07/28 0700 07/28 0701 - 07/29 0700   I.V. 1863.8    IV Piggyback 250    Total Intake 2113.8     Urine 250    Blood 1000    Total Output 1250     Net +863.8             LABORATORY DATA:  Recent Labs  12/25/15 1018 01/01/16 0418    WBC 11.5* 12.6*  HGB 15.2 11.3*  HCT 45.0 32.5*  PLT 252 198    Recent Labs  12/25/15 1018 01/01/16 0418  NA 135 135  K 4.3 3.9  CL 104 104  CO2 26 24  BUN 15 13  CREATININE 0.82 0.84  GLUCOSE 223* 109*  CALCIUM 9.4 8.6*   Lab Results  Component Value Date   INR 1.05 12/21/2015    Recent Radiographic Studies :  Dg Chest 2 View  Result Date: 12/21/2015 CLINICAL DATA:  Preoperative study prior to right total hip arthroplasty. EXAM: CHEST  2 VIEW  COMPARISON:  Chest x-ray of August 27, 2015 and chest CT scan of September 03, 2015 FINDINGS: The lungs are adequately inflated. The interstitial markings are coarse but stable. The heart and pulmonary vascularity are normal. The mediastinum is normal in width. There is calcification in the wall of the aortic arch. There is no pleural effusion. The bony thorax exhibits no acute abnormality. IMPRESSION: Mild chronic bronchitic changes. Aortic atherosclerosis. No acute cardiopulmonary abnormality. Electronically Signed   By: David  [place] M.D.   On: 12/21/2015 11:52     Examination:  General appearance: alert, cooperative and no distress  Wound Exam: clean, dry, intact   Drainage:  None: wound tissue dry  Motor Exam: EHL, FHL, Anterior Tibial and Posterior Tibial Intact  Sensory Exam: Superficial Peroneal, Deep Peroneal and Tibial normal  Vascular Exam: Normal  Assessment:    1 Day Post-Op  Procedure(s) (LRB): RIGHT TOTAL HIP ARTHROPLASTY (Right)  ADDITIONAL DIAGNOSIS:  Principal Problem:   Primary osteoarthritis of right hip Active Problems:   S/P total hip arthroplasty  Acute Blood Loss Anemia -asymptomatic  Plan: Physical Therapy as ordered Weight Bearing as Tolerated (WBAT)  DVT Prophylaxis:  Xarelto, Foot Pumps and TED hose  DISCHARGE PLAN: Home  DISCHARGE NEEDS: HHPT, Walker and 3-in-1 comode seat    Voiding without difficulty, dressing clean, dry-doing well without complaints-films with excellent position of  components- will plan on D/C this pm or in am    [name] W  01/01/2016 7:46 AM

## 2016-01-05 NOTE — Anesthesia Postprocedure Evaluation (Signed)
Anesthesia Post Note  Patient: [name]  Procedure(s) Performed: Procedure(s) (LRB): RIGHT TOTAL HIP ARTHROPLASTY (Right)  Patient location during evaluation: PACU Anesthesia Type: Spinal and MAC Level of consciousness: awake and alert Pain management: pain level controlled Vital Signs Assessment: post-procedure vital signs reviewed and stable Respiratory status: spontaneous breathing and respiratory function stable Cardiovascular status: blood pressure returned to baseline and stable Postop Assessment: spinal receding Anesthetic complications: no    Last Vitals:  Vitals:   12/31/15 1952 01/01/16 0500  BP: 112/71 116/68  Pulse: 72 81  Resp: 14 16  Temp: 36.8 C 36.6 C    Last Pain:  Vitals:   01/01/16 1459  TempSrc:   PainSc: 2                  [name]

## 2016-02-19 ENCOUNTER — Encounter (INDEPENDENT_AMBULATORY_CARE_PROVIDER_SITE_OTHER): Payer: Self-pay

## 2016-03-26 ENCOUNTER — Emergency Department (HOSPITAL_COMMUNITY): Payer: BC Managed Care – PPO

## 2016-03-26 ENCOUNTER — Encounter (HOSPITAL_COMMUNITY): Payer: Self-pay | Admitting: *Deleted

## 2016-03-26 ENCOUNTER — Observation Stay (HOSPITAL_COMMUNITY)
Admission: EM | Admit: 2016-03-26 | Discharge: 2016-03-27 | Disposition: A | Discharge status: Home / Self Care | Payer: BC Managed Care – PPO | Attending: Internal Medicine | Admitting: Internal Medicine

## 2016-03-26 DIAGNOSIS — M1990 Unspecified osteoarthritis, unspecified site: Secondary | ICD-10-CM

## 2016-03-26 DIAGNOSIS — Z7901 Long term (current) use of anticoagulants: Secondary | ICD-10-CM | POA: Insufficient documentation

## 2016-03-26 DIAGNOSIS — Y9389 Activity, other specified: Secondary | ICD-10-CM | POA: Insufficient documentation

## 2016-03-26 DIAGNOSIS — Z8619 Personal history of other infectious and parasitic diseases: Secondary | ICD-10-CM | POA: Insufficient documentation

## 2016-03-26 DIAGNOSIS — Z7984 Long term (current) use of oral hypoglycemic drugs: Secondary | ICD-10-CM | POA: Diagnosis not present

## 2016-03-26 DIAGNOSIS — E119 Type 2 diabetes mellitus without complications: Secondary | ICD-10-CM | POA: Insufficient documentation

## 2016-03-26 DIAGNOSIS — K219 Gastro-esophageal reflux disease without esophagitis: Secondary | ICD-10-CM | POA: Diagnosis not present

## 2016-03-26 DIAGNOSIS — I2510 Atherosclerotic heart disease of native coronary artery without angina pectoris: Secondary | ICD-10-CM | POA: Diagnosis not present

## 2016-03-26 DIAGNOSIS — I6523 Occlusion and stenosis of bilateral carotid arteries: Secondary | ICD-10-CM | POA: Insufficient documentation

## 2016-03-26 DIAGNOSIS — I10 Essential (primary) hypertension: Secondary | ICD-10-CM | POA: Insufficient documentation

## 2016-03-26 DIAGNOSIS — K210 Gastro-esophageal reflux disease with esophagitis: Secondary | ICD-10-CM | POA: Diagnosis not present

## 2016-03-26 DIAGNOSIS — R55 Syncope and collapse: Secondary | ICD-10-CM | POA: Diagnosis not present

## 2016-03-26 DIAGNOSIS — I471 Supraventricular tachycardia: Secondary | ICD-10-CM | POA: Insufficient documentation

## 2016-03-26 DIAGNOSIS — E039 Hypothyroidism, unspecified: Secondary | ICD-10-CM | POA: Diagnosis not present

## 2016-03-26 DIAGNOSIS — Z91030 Bee allergy status: Secondary | ICD-10-CM | POA: Diagnosis not present

## 2016-03-26 DIAGNOSIS — Y92488 Other paved roadways as the place of occurrence of the external cause: Secondary | ICD-10-CM | POA: Insufficient documentation

## 2016-03-26 DIAGNOSIS — K5730 Diverticulosis of large intestine without perforation or abscess without bleeding: Secondary | ICD-10-CM | POA: Insufficient documentation

## 2016-03-26 DIAGNOSIS — Z7982 Long term (current) use of aspirin: Secondary | ICD-10-CM | POA: Diagnosis not present

## 2016-03-26 DIAGNOSIS — Y998 Other external cause status: Secondary | ICD-10-CM | POA: Insufficient documentation

## 2016-03-26 DIAGNOSIS — M47812 Spondylosis without myelopathy or radiculopathy, cervical region: Secondary | ICD-10-CM | POA: Diagnosis not present

## 2016-03-26 DIAGNOSIS — H918X3 Other specified hearing loss, bilateral: Secondary | ICD-10-CM | POA: Diagnosis not present

## 2016-03-26 DIAGNOSIS — Z87891 Personal history of nicotine dependence: Secondary | ICD-10-CM | POA: Insufficient documentation

## 2016-03-26 DIAGNOSIS — M1611 Unilateral primary osteoarthritis, right hip: Secondary | ICD-10-CM | POA: Insufficient documentation

## 2016-03-26 DIAGNOSIS — R911 Solitary pulmonary nodule: Secondary | ICD-10-CM | POA: Diagnosis present

## 2016-03-26 DIAGNOSIS — I700 Atherosclerosis of aorta: Secondary | ICD-10-CM | POA: Diagnosis not present

## 2016-03-26 LAB — CBC WITH DIFFERENTIAL/PLATELET
BASOS ABS: 0.1 10*3/uL (ref 0.0–0.1)
BASOS PCT: 0 %
EOS ABS: 0.2 10*3/uL (ref 0.0–0.7)
Eosinophils Relative: 1 %
HCT: 46.5 % (ref 39.0–52.0)
HEMOGLOBIN: 15.9 g/dL (ref 13.0–17.0)
Lymphocytes Relative: 15 %
Lymphs Abs: 3.1 10*3/uL (ref 0.7–4.0)
MCH: 29.2 pg (ref 26.0–34.0)
MCHC: 34.2 g/dL (ref 30.0–36.0)
MCV: 85.3 fL (ref 78.0–100.0)
Monocytes Absolute: 1 10*3/uL (ref 0.1–1.0)
Monocytes Relative: 5 %
NEUTROS PCT: 79 %
Neutro Abs: 16.5 10*3/uL — ABNORMAL HIGH (ref 1.7–7.7)
Platelets: 299 10*3/uL (ref 150–400)
RBC: 5.45 MIL/uL (ref 4.22–5.81)
RDW: 13.4 % (ref 11.5–15.5)
WBC: 20.7 10*3/uL — AB (ref 4.0–10.5)

## 2016-03-26 LAB — COMPREHENSIVE METABOLIC PANEL
ALBUMIN: 4 g/dL (ref 3.5–5.0)
ALK PHOS: 54 U/L (ref 38–126)
ALT: 22 U/L (ref 17–63)
AST: 29 U/L (ref 15–41)
Anion gap: 11 (ref 5–15)
BUN: 22 mg/dL — ABNORMAL HIGH (ref 6–20)
CALCIUM: 9.3 mg/dL (ref 8.9–10.3)
CHLORIDE: 106 mmol/L (ref 101–111)
CO2: 18 mmol/L — AB (ref 22–32)
CREATININE: 0.89 mg/dL (ref 0.61–1.24)
GFR calc Af Amer: >60 mL/min (ref >60)
GFR calc non Af Amer: >60 mL/min (ref >60)
GLUCOSE: 183 mg/dL — AB (ref 65–99)
Potassium: 4.2 mmol/L (ref 3.5–5.1)
Sodium: 135 mmol/L (ref 135–145)
Total Bilirubin: 1 mg/dL (ref 0.3–1.2)
Total Protein: 6.7 g/dL (ref 6.5–8.1)

## 2016-03-26 LAB — PROTIME-INR
INR: 1.08
PROTHROMBIN TIME: 14.1 s (ref 11.4–15.2)

## 2016-03-26 LAB — I-STAT TROPONIN, ED: Troponin i, poc: 0 ng/mL (ref 0.00–0.08)

## 2016-03-26 LAB — MAGNESIUM: MAGNESIUM: 1.8 mg/dL (ref 1.7–2.4)

## 2016-03-26 LAB — ETHANOL

## 2016-03-26 MED ORDER — EPINEPHRINE 0.3 MG/0.3ML IJ SOAJ
0.3000 mg | INTRAMUSCULAR | Status: DC | PRN
  Filled 2016-03-26: qty 0.3

## 2016-03-26 MED ORDER — ONDANSETRON HCL 4 MG/2ML IJ SOLN: 4.0000 mg | Freq: Four times a day (QID) | INTRAMUSCULAR | Status: DC | PRN

## 2016-03-26 MED ORDER — VITAMIN B-1 100 MG PO TABS
100.0000 mg | ORAL_TABLET | Freq: Every day | ORAL | Status: DC
  Administered 2016-03-26 – 2016-03-27 (×2): 100 mg via ORAL
  Filled 2016-03-26 (×2): qty 1

## 2016-03-26 MED ORDER — SODIUM CHLORIDE 0.9 % IV SOLN
INTRAVENOUS | Status: DC
  Administered 2016-03-27: 01:00:00 via INTRAVENOUS

## 2016-03-26 MED ORDER — ENOXAPARIN SODIUM 40 MG/0.4ML ~~LOC~~ SOLN
40.0000 mg | SUBCUTANEOUS | Status: DC
  Administered 2016-03-26: 40 mg via SUBCUTANEOUS
  Filled 2016-03-26: qty 0.4

## 2016-03-26 MED ORDER — ACETAMINOPHEN 650 MG RE SUPP: 650.0000 mg | Freq: Four times a day (QID) | RECTAL | Status: DC | PRN

## 2016-03-26 MED ORDER — LORAZEPAM 2 MG/ML IJ SOLN: 0.0000 mg | Freq: Four times a day (QID) | INTRAMUSCULAR | Status: DC

## 2016-03-26 MED ORDER — LORAZEPAM 1 MG PO TABS: 1.0000 mg | ORAL_TABLET | Freq: Four times a day (QID) | ORAL | Status: DC | PRN

## 2016-03-26 MED ORDER — THIAMINE HCL 100 MG/ML IJ SOLN
100.0000 mg | Freq: Every day | INTRAMUSCULAR | Status: DC
  Filled 2016-03-26: qty 2

## 2016-03-26 MED ORDER — LORAZEPAM 2 MG/ML IJ SOLN: 0.0000 mg | Freq: Two times a day (BID) | INTRAMUSCULAR | Status: DC

## 2016-03-26 MED ORDER — SODIUM CHLORIDE 0.9% FLUSH
3.0000 mL | Freq: Two times a day (BID) | INTRAVENOUS | Status: DC
  Administered 2016-03-26: 3 mL via INTRAVENOUS

## 2016-03-26 MED ORDER — ONDANSETRON HCL 4 MG PO TABS: 4.0000 mg | ORAL_TABLET | Freq: Four times a day (QID) | ORAL | Status: DC | PRN

## 2016-03-26 MED ORDER — CLOBETASOL PROPIONATE 0.05 % EX CREA: 1.0000 "application " | TOPICAL_CREAM | Freq: Every day | CUTANEOUS | Status: DC | PRN

## 2016-03-26 MED ORDER — ASPIRIN 81 MG PO CHEW
81.0000 mg | CHEWABLE_TABLET | Freq: Every day | ORAL | Status: DC
Start: 2016-03-27 — End: 2016-03-27
  Administered 2016-03-27: 81 mg via ORAL
  Filled 2016-03-26: qty 1

## 2016-03-26 MED ORDER — ZOLPIDEM TARTRATE 5 MG PO TABS
5.0000 mg | ORAL_TABLET | Freq: Every evening | ORAL | Status: DC | PRN
  Filled 2016-03-26: qty 1

## 2016-03-26 MED ORDER — IOPAMIDOL (ISOVUE-370) INJECTION 76%
INTRAVENOUS | Status: AC
  Administered 2016-03-26: 100 mL
  Filled 2016-03-26: qty 100

## 2016-03-26 MED ORDER — OXYCODONE-ACETAMINOPHEN 5-325 MG PO TABS: 1.0000 | ORAL_TABLET | ORAL | Status: DC | PRN

## 2016-03-26 MED ORDER — METOPROLOL SUCCINATE ER 25 MG PO TB24
25.0000 mg | ORAL_TABLET | Freq: Every day | ORAL | Status: DC
  Administered 2016-03-27: 25 mg via ORAL
  Filled 2016-03-26: qty 1

## 2016-03-26 MED ORDER — SODIUM CHLORIDE 0.9 % IV BOLUS (SEPSIS)
1000.0000 mL | Freq: Once | INTRAVENOUS | Status: AC
  Administered 2016-03-26: 1000 mL via INTRAVENOUS

## 2016-03-26 MED ORDER — LEVOTHYROXINE SODIUM 75 MCG PO TABS
75.0000 ug | ORAL_TABLET | Freq: Every day | ORAL | Status: DC
  Administered 2016-03-27: 75 ug via ORAL
  Filled 2016-03-26: qty 1

## 2016-03-26 MED ORDER — LISINOPRIL 10 MG PO TABS
10.0000 mg | ORAL_TABLET | Freq: Every day | ORAL | Status: DC
  Administered 2016-03-27: 10 mg via ORAL
  Filled 2016-03-26: qty 1

## 2016-03-26 MED ORDER — ADULT MULTIVITAMIN W/MINERALS CH
1.0000 | ORAL_TABLET | Freq: Every day | ORAL | Status: DC
  Administered 2016-03-27: 1 via ORAL
  Filled 2016-03-26 (×2): qty 1

## 2016-03-26 MED ORDER — FOLIC ACID 1 MG PO TABS
1.0000 mg | ORAL_TABLET | Freq: Every day | ORAL | Status: DC
  Administered 2016-03-26 – 2016-03-27 (×2): 1 mg via ORAL
  Filled 2016-03-26 (×2): qty 1

## 2016-03-26 MED ORDER — ACETAMINOPHEN 325 MG PO TABS: 650.0000 mg | ORAL_TABLET | Freq: Four times a day (QID) | ORAL | Status: DC | PRN

## 2016-03-26 MED ORDER — VITAMIN D 1000 UNITS PO TABS
2000.0000 [IU] | ORAL_TABLET | Freq: Every day | ORAL | Status: DC
  Administered 2016-03-27: 2000 [IU] via ORAL
  Filled 2016-03-26: qty 2

## 2016-03-26 MED ORDER — LORAZEPAM 2 MG/ML IJ SOLN: 1.0000 mg | Freq: Four times a day (QID) | INTRAMUSCULAR | Status: DC | PRN

## 2016-03-26 NOTE — ED Notes (Signed)
Admitting at bedside 

## 2016-03-26 NOTE — ED Notes (Signed)
Attempted report 
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
## 2016-03-26 NOTE — ED Triage Notes (Signed)
Pt c/o right lateal back pain
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
## 2016-03-27 ENCOUNTER — Encounter (HOSPITAL_COMMUNITY): Payer: BC Managed Care – PPO

## 2016-03-27 ENCOUNTER — Observation Stay (HOSPITAL_BASED_OUTPATIENT_CLINIC_OR_DEPARTMENT_OTHER): Payer: BC Managed Care – PPO

## 2016-03-27 ENCOUNTER — Observation Stay (HOSPITAL_COMMUNITY): Payer: BC Managed Care – PPO

## 2016-03-27 DIAGNOSIS — R55 Syncope and collapse: Secondary | ICD-10-CM | POA: Diagnosis not present

## 2016-03-27 DIAGNOSIS — E119 Type 2 diabetes mellitus without complications: Secondary | ICD-10-CM

## 2016-03-27 DIAGNOSIS — R911 Solitary pulmonary nodule: Secondary | ICD-10-CM | POA: Diagnosis present

## 2016-03-27 LAB — CBC
HEMATOCRIT: 46.3 % (ref 39.0–52.0)
HEMOGLOBIN: 15.6 g/dL (ref 13.0–17.0)
MCH: 29.1 pg (ref 26.0–34.0)
MCHC: 33.7 g/dL (ref 30.0–36.0)
MCV: 86.2 fL (ref 78.0–100.0)
Platelets: 245 10*3/uL (ref 150–400)
RBC: 5.37 MIL/uL (ref 4.22–5.81)
RDW: 13.6 % (ref 11.5–15.5)
WBC: 15.4 10*3/uL — ABNORMAL HIGH (ref 4.0–10.5)

## 2016-03-27 LAB — ECHOCARDIOGRAM COMPLETE
Height: 68 in
WEIGHTICAEL: 3352 [oz_av]

## 2016-03-27 LAB — TSH: TSH: 1.57 u[IU]/mL (ref 0.350–4.500)

## 2016-03-27 LAB — URINALYSIS, ROUTINE W REFLEX MICROSCOPIC
Bilirubin Urine: NEGATIVE
GLUCOSE, UA: NEGATIVE mg/dL
Hgb urine dipstick: NEGATIVE
Ketones, ur: NEGATIVE mg/dL
LEUKOCYTES UA: NEGATIVE
NITRITE: NEGATIVE
PH: 5.5 (ref 5.0–8.0)
Protein, ur: NEGATIVE mg/dL
SPECIFIC GRAVITY, URINE: 1.043 — AB (ref 1.005–1.030)

## 2016-03-27 LAB — BASIC METABOLIC PANEL
Anion gap: 7 (ref 5–15)
BUN: 13 mg/dL (ref 6–20)
CHLORIDE: 108 mmol/L (ref 101–111)
CO2: 22 mmol/L (ref 22–32)
Calcium: 9.2 mg/dL (ref 8.9–10.3)
Creatinine, Ser: 0.67 mg/dL (ref 0.61–1.24)
GFR calc non Af Amer: >60 mL/min (ref >60)
Glucose, Bld: 112 mg/dL — ABNORMAL HIGH (ref 65–99)
POTASSIUM: 3.7 mmol/L (ref 3.5–5.1)
SODIUM: 137 mmol/L (ref 135–145)

## 2016-03-27 LAB — RAPID URINE DRUG SCREEN, HOSP PERFORMED
Amphetamines: NOT DETECTED
BARBITURATES: NOT DETECTED
Benzodiazepines: NOT DETECTED
COCAINE: NOT DETECTED
Opiates: NOT DETECTED
TETRAHYDROCANNABINOL: POSITIVE — AB

## 2016-03-27 LAB — GLUCOSE, CAPILLARY: GLUCOSE-CAPILLARY: 126 mg/dL — AB (ref 65–99)

## 2016-03-27 MED ORDER — LORAZEPAM 2 MG/ML IJ SOLN: 0.0000 mg | Freq: Four times a day (QID) | INTRAMUSCULAR | Status: DC

## 2016-03-27 MED ORDER — LORAZEPAM 2 MG/ML IJ SOLN: 0.0000 mg | Freq: Two times a day (BID) | INTRAMUSCULAR | Status: DC

## 2016-03-27 MED ORDER — MUPIROCIN 2 % EX OINT
TOPICAL_OINTMENT | Freq: Every day | CUTANEOUS | Status: DC
  Administered 2016-03-27: 11:00:00 via TOPICAL
  Filled 2016-03-27: qty 22
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
## 2016-03-27 NOTE — Progress Notes (Signed)
  Echocardiogram 2D Echocardiogram has been performed.  Delcie RochENNINGTON, Tajon Moring 03/27/2016, 11:30 AM
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
## 2016-03-27 NOTE — Progress Notes (Signed)
NURSING PROGRESS NOTE  John MilianMark F Coffey [id number] Admitted to [id number]: 03/26/2016 at 20:30  Attending Provider: Lorretta HarpXilin Niu, MD    [name] is a 60 y.o. male patient admitted from ED awake, alert  & orientated  X 4,  Full Code, VSS - Blood pressure 126/82, pulse (!) 106, temperature 97.7 F (36.5 C), temperature source Oral, resp. rate 19, height 5\' 8"  (1.727 m), weight 94.8 kg (209 lb 1.6 oz), SpO2 99 %., RA, no c/o shortness of breath, no c/o chest pain, no distress noted. Tele # [id number] placed and pt is currently running:normal sinus rhythm.   IV site WDL:  forearm left, condition patent and no redness with a transparent dsg that's clean dry and intact.  Allergies:   Allergies  Allergen Reactions  . Bee Venom Anaphylaxis, Swelling and Other (See Comments)    UNSPECIFIED SWELLING AREA  AFFECTED "UNCONSCIOUS" PT WITH EPI-PEN  ** WASPS and YELLOW JACKETS ** per patient     Past Medical History:  Diagnosis Date  . Arthritis   . Diabetes mellitus without complication (HCC)   . GERD (gastroesophageal reflux disease)    occ  . Hepatitis    12 yrs A water  . Hypertension   . Hypothyroidism     History:  obtained from the patient.  Pt orientation to unit, room and routine. Information packet given to patient/family and safety video watched.  Admission INP armband ID verified with patient/family, and in place. SR up x 2, fall risk assessment complete with Patient and family verbalizing understanding of risks associated with falls. Pt verbalizes an understanding of how to use the call bell and to call for help before getting out of bed.  Skin, clean-dry, abrasion to abdomen & BUE  OTA  Will cont to monitor and assist as needed.  John NordmannJackson, [name] DoverMakika, [name] 03/27/2016

## 2016-03-27 NOTE — Progress Notes (Signed)
Report obtained from [place].
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
## 2016-03-27 NOTE — Progress Notes (Signed)
OT Cancellation Note  Patient Details Name: Oswaldo MilianMark F Hardman MRN: [id number] DOB: 1955-07-24   Cancelled Treatment:    Reason Eval/Treat Not Completed: OT screened, no needs identified, will sign off. Per PT, pt currently independent with mobility and no acute OT needs identified. Will screen and sign off at this time. Please re-consult if needs change. Thank you for this referral.  Gaye AlkenBailey A Quanta Roher M.S., OTR/L Pager: [phone]  03/27/2016, 3:22 PM

## 2016-03-30 ENCOUNTER — Ambulatory Visit (INDEPENDENT_AMBULATORY_CARE_PROVIDER_SITE_OTHER): Payer: BC Managed Care – PPO | Admitting: Orthopedic Surgery

## 2016-03-30 ENCOUNTER — Encounter (INDEPENDENT_AMBULATORY_CARE_PROVIDER_SITE_OTHER): Payer: Self-pay | Admitting: Orthopedic Surgery

## 2016-03-30 VITALS — BP 110/80 | HR 70 | Resp 12 | Ht 69.0 in | Wt 219.0 lb

## 2016-03-30 DIAGNOSIS — M25552 Pain in left hip: Secondary | ICD-10-CM | POA: Diagnosis not present

## 2016-03-30 DIAGNOSIS — M1612 Unilateral primary osteoarthritis, left hip: Secondary | ICD-10-CM
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
## 2016-03-30 NOTE — Progress Notes (Signed)
John Campbell, MD   John Code, PA-C [address], [place], [place]  [address]                             [phone]   [name] MRN:  [id number] DOB/SEX:  [birth year]/male  ORTHOPAEDIC HISTORY & PHYSICAL  CHIEF COMPLAINT:  Painful left Hip  HISTORY: [name] a 59 y.o. male  Who has a history of pain and functional disability in the left hip(s) due to arthritis and patient has failed non-surgical conservative treatments for greater than 12 weeks to include NSAID's and/or analgesics, use of assistive devices, weight reduction as appropriate and activity modification.  Onset of symptoms was gradual starting 2 years ago with rapidlly worsening course since that time.The patient noted no past surgery on the left hip(s).  Patient currently rates pain in the left hip at 8 out of 10 with activity. Patient has night pain, worsening of pain with activity and weight bearing, trendelenberg gait, pain that interfers with activities of daily living and pain with passive range of motion. Patient has evidence of subchondral cysts, subchondral sclerosis and joint space narrowing by imaging studies. This condition presents safety issues increasing the risk of falls. There is no current active infection.  PAST MEDICAL HISTORY: Patient Active Problem List   Diagnosis Date Noted  . Lung nodule 03/27/2016  . Syncope 03/26/2016  . Hypothyroidism   . GERD (gastroesophageal reflux disease)   . Diabetes mellitus without complication (HCC)   . Arthritis   . Primary osteoarthritis of right hip 12/31/2015  . S/P total hip arthroplasty 12/31/2015  . Abnormal CBC 08/19/2013  . Diabetes type 2, controlled (HCC) 11/01/2012  . Rash 11/19/2010  . Abdominal pain, other specified site 11/16/2010  . SEBACEOUS CYST 05/10/2010  . VERRUCA VULGARIS 12/25/2009  . ECZEMA, ATOPIC 11/27/2009  . ESOPHAGITIS, REFLUX 12/03/2008  . TENDINITIS, RIGHT ELBOW 10/27/2008   Past Medical History:  Diagnosis  Date  . Arthritis   . Diabetes mellitus without complication (HCC)   . GERD (gastroesophageal reflux disease)    occ  . Hepatitis    12 yrs A water  . Hypertension   . Hypothyroidism    Past Surgical History:  Procedure Laterality Date  . APPENDECTOMY  57  . CERVICAL DISC SURGERY  98  . SHOULDER ARTHROSCOPY W/ ROTATOR CUFF REPAIR Left 13  . TONSILLECTOMY  59  . TOTAL HIP ARTHROPLASTY Right 12/31/2015   Procedure: RIGHT TOTAL HIP ARTHROPLASTY;  Surgeon: Valeria Batman, MD;  Location: [facility] OR;  Service: Orthopedics;  Laterality: Right;     MEDICATIONS PRIOR TO ADMISSION: Prior to Admission medications   Medication Sig Start Date End Date Taking? Authorizing Provider  clobetasol ointment (TEMOVATE) 0.05 % Apply topically. 08/11/15 08/10/16 Yes Historical Provider, MD  glucose blood (ONE TOUCH ULTRA TEST) test strip Check blood sugar twice daily. Dx E11.9 08/11/15  Yes Historical Provider, MD  Injection Device (INJECT-EASE AUTOMATIC INJECTOR) MISC Check blood sugar twice daily. Dx E11.9 04/29/14  Yes Historical Provider, MD  Lancets MISC Check blood sugar twice daily. Dx E11.9 03/30/16  Yes Historical Provider, MD  aspirin 81 MG chewable tablet Chew 81 mg by mouth daily.    Historical Provider, MD  aspirin EC 81 MG tablet Take 81 mg by mouth.    Historical Provider, MD  Cholecalciferol (VITAMIN D3) 2000 units capsule Take 2,000 Units by mouth daily.    Historical Provider,  MD  clobetasol cream (TEMOVATE) 0.05 % Apply 1 application topically daily as needed. For rash    Historical Provider, MD  DOCOSAHEXAENOIC ACID PO Take by mouth.    Historical Provider, MD  EPINEPHrine (EPIPEN JR) 0.15 MG/0.3ML injection Inject 0.15 mg into the skin.    Historical Provider, MD  EPIPEN 2-PAK 0.3 MG/0.3ML DEVI USE AS DIRECTED 11/30/11   Roderick Pee, MD  glipiZIDE (GLUCOTROL XL) 5 MG 24 hr tablet  02/05/16   Historical Provider, MD  glipiZIDE (GLUCOTROL) 5 MG tablet Take 5 mg by mouth daily before breakfast.     Historical Provider, MD  levothyroxine (SYNTHROID, LEVOTHROID) 75 MCG tablet Take 75 mcg by mouth daily before breakfast.    Historical Provider, MD  lisinopril (PRINIVIL,ZESTRIL) 10 MG tablet  02/06/16   Historical Provider, MD  lisinopril (PRINIVIL,ZESTRIL) 5 MG tablet Take 1 tablet (5 mg total) by mouth daily. Patient taking differently: Take 10 mg by mouth daily.  08/19/13   Roderick Pee, MD  metFORMIN (GLUCOPHAGE) 500 MG tablet Take 1 tablet (500 mg total) by mouth 2 (two) times daily with a meal. Patient taking differently: Take 1,000 mg by mouth 2 (two) times daily with a meal.  08/19/13   Roderick Pee, MD  metoprolol succinate (TOPROL-XL) 25 MG 24 hr tablet Take 25 mg by mouth daily.    Historical Provider, MD  Multiple Vitamin (MULTIVITAMIN) tablet Take 1 tablet by mouth daily.    Historical Provider, MD  tadalafil (CIALIS) 10 MG tablet Take 10 mg by mouth.    Historical Provider, MD     ALLERGIES:   Allergies  Allergen Reactions  . Bee Venom Anaphylaxis, Swelling and Other (See Comments)    UNSPECIFIED SWELLING AREA  AFFECTED "UNCONSCIOUS" PT WITH EPI-PEN  ** WASPS and YELLOW JACKETS ** per patient    REVIEW OF SYSTEMS:  Review of Systems  Constitutional: Negative.   Eyes:       Glasses  Respiratory: Negative.   Cardiovascular: Negative.   Gastrointestinal: Negative.   Genitourinary:       On lisinopril to protect renal function  Musculoskeletal: Positive for joint pain.  Skin:       Eczema   Neurological: Negative.   Endo/Heme/Allergies: Negative.   Psychiatric/Behavioral: Negative.     FAMILY HISTORY:   Family History  Problem Relation Age of Onset  . Atrial fibrillation Mother   . Diabetes type II Father     SOCIAL HISTORY:   Social History   Occupational History  . Not on file.   Social History Main Topics  . Smoking status: Former Smoker    Packs/day: 0.50    Years: 40.00    Quit date: 08/21/2015  . Smokeless tobacco: Never Used  . Alcohol  use 4.2 oz/week    7 Cans of beer per week  . Drug use: No  . Sexual activity: Not on file     EXAMINATION:  Vital signs in last 24 hours: BP 110/80   Pulse 70   Resp 12   Ht 5\' 9"  (1.753 m)   Wt 219 lb (99.3 kg)   BMI 32.34 kg/m   Physical Exam  Constitutional: He is oriented to person, place, and time. He appears well-developed and well-nourished.  HENT:  Head: Normocephalic and atraumatic.  Eyes: EOM are normal. Pupils are equal, round, and reactive to light.  Neck: Neck supple.  No carotid bruits  Cardiovascular: Normal rate, regular rhythm, normal heart sounds and intact distal pulses.  Pulmonary/Chest: Effort normal.  Abdominal: Soft. Bowel sounds are normal.  Neurological: He is alert and oriented to person, place, and time.  Skin: Skin is warm and dry.  Psychiatric: He has a normal mood and affect. His behavior is normal. Judgment and thought content normal.     Right Hip Exam  Right hip exam is normal.    Left Hip Exam   Range of Motion  Extension: 0  Flexion: 100  Internal Rotation: 20  External Rotation: 20  Abduction: 20  Adduction: 20   Muscle Strength  The patient has normal left hip strength.   Other  Sensation: normal Pulse: present      Imaging Review Plain radiographs demonstrate moderate degenerative joint disease of the left hip. The bone quality appears to be good for age and reported activity level.  Assessment: End stage arthritis, left Hip  Past Medical History:  Diagnosis Date  . Arthritis   . Diabetes mellitus without complication (HCC)   . GERD (gastroesophageal reflux disease)    occ  . Hepatitis    12 yrs A water  . Hypertension   . Hypothyroidism     Plan: for left total hip replacement.  The patient history, physical examination, clinical judgement of the provider and imaging studies are consistent with end stage degenerative joint disease of the left hip(s) and total hip arthroplasty is deemed medically  necessary. The treatment options including medical management, injection therapy, arthroscopy and arthroplasty were discussed at length. The risks and benefits of total hip arthroplasty were presented and reviewed. The risks due to aseptic loosening, infection, stiffness, dislocation/subluxation,  thromboembolic complications and other imponderables were discussed.  The patient acknowledged the explanation, agreed to proceed with the plan. The clearance notes recently received were reviewed and concurs with proceeding then surgical intervention.  Patient is being admitted for inpatient treatment for surgery, pain control, PT, OT, prophylactic antibiotics, VTE prophylaxis, progressive ambulation and ADL's and discharge planning.The patient is planning to be discharged home with home health services   John CodeBrian Petrarca 03/30/2016, 4:54 PM

## 2016-03-30 NOTE — Progress Notes (Deleted)
[name], MD   [name], PA-C [address], [place], [place]  [address]                             [phone]   [name] MRN:  [id number] DOB/SEX:  June 10, 1955/male  ORTHOPAEDIC HISTORY & PHYSICAL  CHIEF COMPLAINT:  Painful {Left/right:33004} Hip  HISTORY: [name] a 60 y.o. male  Who has a history of pain and functional disability in the {Left/right:3049041} hip(s) due to {trauma,arthritis:3049029} and patient has failed non-surgical conservative treatments for greater than 12 weeks to include {nonsurgical conservative treatment (must select two):3049030}.  Onset of symptoms was {abrupt, gradual:20671} starting {1->10 years:3049031} years ago with {stable, gradual worsening, rapidly worsening:3049032} course since that time.The patient noted {no past surgeries, prior procedures:20693} on the {Left/right:3049041} hip(s).  Patient currently rates pain in the {Left/right:3049041} hip at {1-10:3049035} out of 10 with activity. Patient has {night pain, worsening of pain:3049039}. Patient has evidence of {Radiographic or MRI evidence of (must document one of the below):3046104} by imaging studies. This condition presents safety issues increasing the risk of falls. This patient has had {failure of previous osteotomy, proximal femur fracture:3049040}.  There is no current active infection.  PAST MEDICAL HISTORY: Patient Active Problem List   Diagnosis Date Noted  . Lung nodule 03/27/2016  . Syncope 03/26/2016  . Hypothyroidism   . GERD (gastroesophageal reflux disease)   . Diabetes mellitus without complication (HCC)   . Arthritis   . Primary osteoarthritis of right hip 12/31/2015  . S/P total hip arthroplasty 12/31/2015  . Abnormal CBC 08/19/2013  . Diabetes type 2, controlled (HCC) 11/01/2012  . Rash 11/19/2010  . Abdominal pain, other specified site 11/16/2010  . SEBACEOUS CYST 05/10/2010  . VERRUCA VULGARIS 12/25/2009  . ECZEMA, ATOPIC 11/27/2009  .  ESOPHAGITIS, REFLUX 12/03/2008  . TENDINITIS, RIGHT ELBOW 10/27/2008   Past Medical History:  Diagnosis Date  . Arthritis   . Diabetes mellitus without complication (HCC)   . GERD (gastroesophageal reflux disease)    occ  . Hepatitis    12 yrs A water  . Hypertension   . Hypothyroidism    Past Surgical History:  Procedure Laterality Date  . APPENDECTOMY  57  . CERVICAL DISC SURGERY  98  . SHOULDER ARTHROSCOPY W/ ROTATOR CUFF REPAIR Left 13  . TONSILLECTOMY  59  . TOTAL HIP ARTHROPLASTY Right 12/31/2015   Procedure: RIGHT TOTAL HIP ARTHROPLASTY;  Surgeon: Valeria Batman, MD;  Location: [facility] OR;  Service: Orthopedics;  Laterality: Right;     MEDICATIONS PRIOR TO ADMISSION: Prior to Admission medications   Medication Sig Start Date End Date Taking? Authorizing Provider  clobetasol ointment (TEMOVATE) 0.05 % Apply topically. 08/11/15 08/10/16 Yes Historical Provider, MD  glucose blood (ONE TOUCH ULTRA TEST) test strip Check blood sugar twice daily. Dx E11.9 08/11/15  Yes Historical Provider, MD  Injection Device (INJECT-EASE AUTOMATIC INJECTOR) MISC Check blood sugar twice daily. Dx E11.9 04/29/14  Yes Historical Provider, MD  Lancets MISC Check blood sugar twice daily. Dx E11.9 03/30/16  Yes Historical Provider, MD  aspirin 81 MG chewable tablet Chew 81 mg by mouth daily.    Historical Provider, MD  aspirin EC 81 MG tablet Take 81 mg by mouth.    Historical Provider, MD  Cholecalciferol (VITAMIN D3) 2000 units capsule Take 2,000 Units by mouth daily.    Historical Provider, MD  clobetasol cream (TEMOVATE) 0.05 % Apply  1 application topically daily as needed. For rash    Historical Provider, MD  DOCOSAHEXAENOIC ACID PO Take by mouth.    Historical Provider, MD  EPINEPHrine (EPIPEN JR) 0.15 MG/0.3ML injection Inject 0.15 mg into the skin.    Historical Provider, MD  EPIPEN 2-PAK 0.3 MG/0.3ML DEVI USE AS DIRECTED 11/30/11   Roderick PeeJeffrey A Todd, MD  glipiZIDE (GLUCOTROL XL) 5 MG 24 hr tablet   02/05/16   Historical Provider, MD  glipiZIDE (GLUCOTROL) 5 MG tablet Take 5 mg by mouth daily before breakfast.    Historical Provider, MD  levothyroxine (SYNTHROID, LEVOTHROID) 75 MCG tablet Take 75 mcg by mouth daily before breakfast.    Historical Provider, MD  lisinopril (PRINIVIL,ZESTRIL) 10 MG tablet  02/06/16   Historical Provider, MD  lisinopril (PRINIVIL,ZESTRIL) 5 MG tablet Take 1 tablet (5 mg total) by mouth daily. Patient taking differently: Take 10 mg by mouth daily.  08/19/13   Roderick PeeJeffrey A Todd, MD  metFORMIN (GLUCOPHAGE) 500 MG tablet Take 1 tablet (500 mg total) by mouth 2 (two) times daily with a meal. Patient taking differently: Take 1,000 mg by mouth 2 (two) times daily with a meal.  08/19/13   Roderick PeeJeffrey A Todd, MD  metoprolol succinate (TOPROL-XL) 25 MG 24 hr tablet Take 25 mg by mouth daily.    Historical Provider, MD  Multiple Vitamin (MULTIVITAMIN) tablet Take 1 tablet by mouth daily.    Historical Provider, MD  tadalafil (CIALIS) 10 MG tablet Take 10 mg by mouth.    Historical Provider, MD     ALLERGIES:   Allergies  Allergen Reactions  . Bee Venom Anaphylaxis, Swelling and Other (See Comments)    UNSPECIFIED SWELLING AREA  AFFECTED "UNCONSCIOUS" PT WITH EPI-PEN  ** WASPS and YELLOW JACKETS ** per patient    REVIEW OF SYSTEMS:  {Ros - complete:30496}  FAMILY HISTORY:   Family History  Problem Relation Age of Onset  . Atrial fibrillation Mother   . Diabetes type II Father     SOCIAL HISTORY:   Social History  Substance Use Topics  . Smoking status: Former Smoker    Packs/day: 0.50    Years: 40.00    Quit date: 08/21/2015  . Smokeless tobacco: Never Used  . Alcohol use 4.2 oz/week    7 Cans of beer per week     EXAMINATION:  Vital signs in last 24 hours: @VSRANGES @  Head is normocephalic.   Eyes:  Pupils equal, round and reactive to light and accommodation.  Extraocular intact. ENT: Ears, nose, and throat were benign.   Neck: supple, no bruits were  noted.   Chest: good expansion.   Lungs: essentially clear.   Cardiac: regular rhythm and rate, normal S1, S2.  No murmurs appreciated. Pulses :  {NUMBERS; 1+ TO 4+, TRACE/RARE:14493} bilateral and symmetric in lower extremities. Abdomen is scaphoid, soft, nontender, no masses palpable, normal bowel sounds                  present. CNS:  He is oriented x3 and cranial nerves II-XII grossly intact. Breast, rectal, and genital exams: not performed and not indicated for an orthopedic evaluation. Musculoskeletal: ROM IR *** degrees,  ER *** degrees. Positive log roll. {Exam; Complete Normal And System Select:17964}  Imaging Review Plain radiographs demonstrate {mild, mod, sev:15682} degenerative joint disease of the {left/right/bi:30031} hip. The bone quality appears to be {good/fair/poor/excellent:33178} for age and reported activity level.  Assessment: End stage arthritis, {left/right/bi:30031} Hip  Past Medical History:  Diagnosis  Date  . Arthritis   . Diabetes mellitus without complication (HCC)   . GERD (gastroesophageal reflux disease)    occ  . Hepatitis    12 yrs A water  . Hypertension   . Hypothyroidism     Plan: for {Hip or for disposition:15846}.  The patient history, physical examination, clinical judgement of the provider and imaging studies are consistent with end stage degenerative joint disease of the {Left/right:3049041} hip(s) and total hip arthroplasty is deemed medically necessary. The treatment options including medical management, injection therapy, arthroscopy and arthroplasty were discussed at length. The risks and benefits of total hip arthroplasty were presented and reviewed. The risks due to aseptic loosening, infection, stiffness, dislocation/subluxation,  thromboembolic complications and other imponderables were discussed.  The patient acknowledged the explanation, agreed to proceed with the plan. The clearance notes recently received were reviewed and concurs  with proceeding then surgical intervention.  Patient is being admitted for inpatient treatment for surgery, pain control, PT, OT, prophylactic antibiotics, VTE prophylaxis, progressive ambulation and ADL's and discharge planning.The patient is planning to be discharged {home with home health services/to inpatient ZOXWR:[id number]}   [name] 03/30/2016, 3:55 PM L

## 2016-04-01 NOTE — Pre-Procedure Instructions (Signed)
    John MilianMark F Headley  04/01/2016      [id number] - [place] - [address] AT [address]OSS [address] [place] [address] Phone: [phone] Fax: [phone]  [facility] - [place], [place] - [address] [place] [address] Phone: [phone] Fax: [phone]    Your procedure is scheduled on 04/12/16.  [facility]  Call this number if you have problems the morning of surgery:  [phone]   Remember:  Do not eat food or drink liquids after midnight.  Take these medicines the morning of surgery with A SIP OF WATER --synthroid,metoprolol   Do not wear jewelry, make-up or nail polish.  Do not wear lotions, powders, or perfumes, or deoderant.  Do not shave 48 hours prior to surgery.  Men may shave face and neck.  Do not bring valuables to the hospital.  [facility] is not responsible for any belongings or valuables.  Contacts, dentures or bridgework may not be worn into surgery.  Leave your suitcase in the car.  After surgery it may be brought to your room.  For patients admitted to the hospital, discharge time will be determined by your treatment team.  Patients discharged the day of surgery will not be allowed to drive home.   Name and phone number of your driver:    Special instructions:  Do not take any aspirin,anti-inflammatories,vitamins,or herbal supplements 5-7 days prior to surgery.  Please read over the following fact sheets that you were given. MRSA Information

## 2016-04-04 ENCOUNTER — Encounter (HOSPITAL_COMMUNITY)
Admission: RE | Admit: 2016-04-04 | Discharge: 2016-04-04 | Disposition: A | Discharge status: Home / Self Care | Payer: BC Managed Care – PPO | Source: Ambulatory Visit | Attending: Orthopaedic Surgery | Admitting: Orthopaedic Surgery

## 2016-04-04 ENCOUNTER — Encounter (HOSPITAL_COMMUNITY): Payer: Self-pay

## 2016-04-04 DIAGNOSIS — N529 Male erectile dysfunction, unspecified: Secondary | ICD-10-CM | POA: Diagnosis not present

## 2016-04-04 DIAGNOSIS — E039 Hypothyroidism, unspecified: Secondary | ICD-10-CM | POA: Diagnosis not present

## 2016-04-04 DIAGNOSIS — L723 Sebaceous cyst: Secondary | ICD-10-CM | POA: Diagnosis not present

## 2016-04-04 DIAGNOSIS — M1612 Unilateral primary osteoarthritis, left hip: Secondary | ICD-10-CM | POA: Insufficient documentation

## 2016-04-04 DIAGNOSIS — E119 Type 2 diabetes mellitus without complications: Secondary | ICD-10-CM | POA: Diagnosis not present

## 2016-04-04 DIAGNOSIS — Z01812 Encounter for preprocedural laboratory examination: Secondary | ICD-10-CM | POA: Diagnosis not present

## 2016-04-04 DIAGNOSIS — R55 Syncope and collapse: Secondary | ICD-10-CM | POA: Insufficient documentation

## 2016-04-04 DIAGNOSIS — R809 Proteinuria, unspecified: Secondary | ICD-10-CM | POA: Diagnosis not present

## 2016-04-04 DIAGNOSIS — R911 Solitary pulmonary nodule: Secondary | ICD-10-CM | POA: Diagnosis not present

## 2016-04-04 DIAGNOSIS — I2510 Atherosclerotic heart disease of native coronary artery without angina pectoris: Secondary | ICD-10-CM | POA: Insufficient documentation

## 2016-04-04 DIAGNOSIS — Z96641 Presence of right artificial hip joint: Secondary | ICD-10-CM | POA: Insufficient documentation

## 2016-04-04 DIAGNOSIS — I10 Essential (primary) hypertension: Secondary | ICD-10-CM | POA: Insufficient documentation

## 2016-04-04 DIAGNOSIS — K219 Gastro-esophageal reflux disease without esophagitis: Secondary | ICD-10-CM | POA: Insufficient documentation

## 2016-04-04 DIAGNOSIS — M1611 Unilateral primary osteoarthritis, right hip: Secondary | ICD-10-CM | POA: Insufficient documentation

## 2016-04-04 HISTORY — DX: Syncope and collapse: R55

## 2016-04-04 LAB — CBC WITH DIFFERENTIAL/PLATELET
Basophils Absolute: 0.1 10*3/uL (ref 0.0–0.1)
Basophils Relative: 0 %
Eosinophils Absolute: 0.2 10*3/uL (ref 0.0–0.7)
Eosinophils Relative: 1 %
HEMATOCRIT: 47.5 % (ref 39.0–52.0)
HEMOGLOBIN: 16.1 g/dL (ref 13.0–17.0)
LYMPHS ABS: 3.4 10*3/uL (ref 0.7–4.0)
Lymphocytes Relative: 25 %
MCH: 28.9 pg (ref 26.0–34.0)
MCHC: 33.9 g/dL (ref 30.0–36.0)
MCV: 85.3 fL (ref 78.0–100.0)
MONOS PCT: 6 %
Monocytes Absolute: 0.9 10*3/uL (ref 0.1–1.0)
NEUTROS ABS: 9 10*3/uL — AB (ref 1.7–7.7)
NEUTROS PCT: 68 %
Platelets: 290 10*3/uL (ref 150–400)
RBC: 5.57 MIL/uL (ref 4.22–5.81)
RDW: 13.6 % (ref 11.5–15.5)
WBC: 13.5 10*3/uL — ABNORMAL HIGH (ref 4.0–10.5)

## 2016-04-04 LAB — COMPREHENSIVE METABOLIC PANEL
ALBUMIN: 4.1 g/dL (ref 3.5–5.0)
ALK PHOS: 59 U/L (ref 38–126)
ALT: 20 U/L (ref 17–63)
ANION GAP: 7 (ref 5–15)
AST: 20 U/L (ref 15–41)
BILIRUBIN TOTAL: 0.9 mg/dL (ref 0.3–1.2)
BUN: 14 mg/dL (ref 6–20)
CALCIUM: 9.5 mg/dL (ref 8.9–10.3)
CO2: 24 mmol/L (ref 22–32)
CREATININE: 0.88 mg/dL (ref 0.61–1.24)
Chloride: 107 mmol/L (ref 101–111)
GFR calc Af Amer: >60 mL/min (ref >60)
GFR calc non Af Amer: >60 mL/min (ref >60)
GLUCOSE: 114 mg/dL — AB (ref 65–99)
Potassium: 4 mmol/L (ref 3.5–5.1)
Sodium: 138 mmol/L (ref 135–145)
TOTAL PROTEIN: 7.1 g/dL (ref 6.5–8.1)

## 2016-04-04 LAB — SURGICAL PCR SCREEN
MRSA, PCR: NEGATIVE
STAPHYLOCOCCUS AUREUS: NEGATIVE

## 2016-04-04 LAB — URINALYSIS, ROUTINE W REFLEX MICROSCOPIC
Bilirubin Urine: NEGATIVE
Glucose, UA: NEGATIVE mg/dL
HGB URINE DIPSTICK: NEGATIVE
Ketones, ur: NEGATIVE mg/dL
LEUKOCYTES UA: NEGATIVE
NITRITE: NEGATIVE
PROTEIN: NEGATIVE mg/dL
SPECIFIC GRAVITY, URINE: 1.024 (ref 1.005–1.030)
pH: 5.5 (ref 5.0–8.0)

## 2016-04-04 LAB — TYPE AND SCREEN
ABO/RH(D): AB POS
Antibody Screen: NEGATIVE

## 2016-04-04 LAB — APTT: aPTT: 32 seconds (ref 24–36)

## 2016-04-04 LAB — PROTIME-INR
INR: 1.06
Prothrombin Time: 13.9 seconds (ref 11.4–15.2)

## 2016-04-04 LAB — GLUCOSE, CAPILLARY: GLUCOSE-CAPILLARY: 112 mg/dL — AB (ref 65–99)

## 2016-04-05 LAB — URINE CULTURE
Culture: NO GROWTH
Special Requests: NORMAL

## 2016-04-11 MED ORDER — SODIUM CHLORIDE 0.9 % IV SOLN: INTRAVENOUS | Status: DC

## 2016-04-11 MED ORDER — ACETAMINOPHEN 10 MG/ML IV SOLN
1000.0000 mg | Freq: Once | INTRAVENOUS | Status: AC
  Administered 2016-04-12: 1000 mg via INTRAVENOUS
  Filled 2016-04-11: qty 100

## 2016-04-11 MED ORDER — CEFAZOLIN SODIUM-DEXTROSE 2-4 GM/100ML-% IV SOLN
2.0000 g | INTRAVENOUS | Status: AC
  Administered 2016-04-12: 2 g via INTRAVENOUS
  Filled 2016-04-11: qty 100

## 2016-04-11 MED ORDER — TRANEXAMIC ACID 1000 MG/10ML IV SOLN
2000.0000 mg | INTRAVENOUS | Status: AC
  Administered 2016-04-12: 2000 mg via TOPICAL
  Filled 2016-04-11: qty 20

## 2016-04-11 NOTE — H&P (Signed)
John CampbellPeter Whitfield, MD   John CodeBrian Nimrit Kehres, PA-C [address], [place], [place]  [address]                             [phone]   John MilianMark F Coffey MRN:  [id number] DOB/SEX:  May 13, 1956/male  ORTHOPAEDIC HISTORY & PHYSICAL  CHIEF COMPLAINT:  Painful left Hip  HISTORY: [name] a 60 y.o. male  Who has a history of pain and functional disability in the left hip(s) due to arthritis and patient has failed non-surgical conservative treatments for greater than 12 weeks to include NSAID's and/or analgesics, use of assistive devices, weight reduction as appropriate and activity modification.  Onset of symptoms was gradual starting 2 years ago with rapidlly worsening course since that time.The patient noted no past surgery on the left hip(s).  Patient currently rates pain in the left hip at 8 out of 10 with activity. Patient has night pain, worsening of pain with activity and weight bearing, trendelenberg gait, pain that interfers with activities of daily living and pain with passive range of motion. Patient has evidence of subchondral cysts, subchondral sclerosis and joint space narrowing by imaging studies. This condition presents safety issues increasing the risk of falls.There is no current active infection.  PAST MEDICAL HISTORY:     Patient Active Problem List   Diagnosis Date Noted  . Lung nodule 03/27/2016  . Syncope 03/26/2016  . Hypothyroidism   . GERD (gastroesophageal reflux disease)   . Diabetes mellitus without complication (HCC)   . Arthritis   . Primary osteoarthritis of right hip 12/31/2015  . S/P total hip arthroplasty 12/31/2015  . Abnormal CBC 08/19/2013  . Diabetes type 2, controlled (HCC) 11/01/2012  . Rash 11/19/2010  . Abdominal pain, other specified site 11/16/2010  . SEBACEOUS CYST 05/10/2010  . VERRUCA VULGARIS 12/25/2009  . ECZEMA, ATOPIC 11/27/2009  . ESOPHAGITIS, REFLUX 12/03/2008  . TENDINITIS, RIGHT ELBOW 10/27/2008       Past Medical  History:  Diagnosis Date  . Arthritis   . Diabetes mellitus without complication (HCC)   . GERD (gastroesophageal reflux disease)    occ  . Hepatitis    12 yrs A water  . Hypertension   . Hypothyroidism         Past Surgical History:  Procedure Laterality Date  . APPENDECTOMY  57  . CERVICAL DISC SURGERY  89  . SHOULDER ARTHROSCOPY W/ ROTATOR CUFF REPAIR Left 13  . TONSILLECTOMY  59  . TOTAL HIP ARTHROPLASTY Right 12/31/2015   Procedure: RIGHT TOTAL HIP ARTHROPLASTY;  Surgeon: Valeria BatmanPeter W Whitfield, MD;  [facility];  Service: Orthopedics;  Laterality: Right;     MEDICATIONS PRIOR TO ADMISSION:        Prior to Admission medications   Medication Sig Start Date End Date Taking? Authorizing Provider  clobetasol ointment (TEMOVATE) 0.05 % Apply topically. 08/11/15 08/10/16 Yes Historical Provider, MD  glucose blood (ONE TOUCH ULTRA TEST) test strip Check blood sugar twice daily. Dx E11.9 08/11/15  Yes Historical Provider, MD  Injection Device (INJECT-EASE AUTOMATIC INJECTOR) MISC Check blood sugar twice daily. Dx E11.9 04/29/14  Yes Historical Provider, MD  Lancets MISC Check blood sugar twice daily. Dx E11.9 03/30/16  Yes Historical Provider, MD  aspirin 81 MG chewable tablet Chew 81 mg by mouth daily.    Historical Provider, MD  aspirin EC 81 MG tablet Take 81 mg by mouth.    Historical Provider, MD  Cholecalciferol (VITAMIN D3) 2000 units capsule Take 2,000 Units by mouth daily.    Historical Provider, MD  clobetasol cream (TEMOVATE) 0.05 % Apply 1 application topically daily as needed. For rash    Historical Provider, MD  DOCOSAHEXAENOIC ACID PO Take by mouth.    Historical Provider, MD  EPINEPHrine (EPIPEN JR) 0.15 MG/0.3ML injection Inject 0.15 mg into the skin.    Historical Provider, MD  EPIPEN 2-PAK 0.3 MG/0.3ML DEVI USE AS DIRECTED 11/30/11   Roderick Pee, MD  glipiZIDE (GLUCOTROL XL) 5 MG 24 hr tablet  02/05/16   Historical Provider, MD   glipiZIDE (GLUCOTROL) 5 MG tablet Take 5 mg by mouth daily before breakfast.    Historical Provider, MD  levothyroxine (SYNTHROID, LEVOTHROID) 75 MCG tablet Take 75 mcg by mouth daily before breakfast.    Historical Provider, MD  lisinopril (PRINIVIL,ZESTRIL) 10 MG tablet  02/06/16   Historical Provider, MD  lisinopril (PRINIVIL,ZESTRIL) 5 MG tablet Take 1 tablet (5 mg total) by mouth daily. Patient taking differently: Take 10 mg by mouth daily.  08/19/13   Roderick Pee, MD  metFORMIN (GLUCOPHAGE) 500 MG tablet Take 1 tablet (500 mg total) by mouth 2 (two) times daily with a meal. Patient taking differently: Take 1,000 mg by mouth 2 (two) times daily with a meal.  08/19/13   Roderick Pee, MD  metoprolol succinate (TOPROL-XL) 25 MG 24 hr tablet Take 25 mg by mouth daily.    Historical Provider, MD  Multiple Vitamin (MULTIVITAMIN) tablet Take 1 tablet by mouth daily.    Historical Provider, MD  tadalafil (CIALIS) 10 MG tablet Take 10 mg by mouth.    Historical Provider, MD     ALLERGIES:        Allergies  Allergen Reactions  . Bee Venom Anaphylaxis, Swelling and Other (See Comments)    UNSPECIFIED SWELLING AREA  AFFECTED "UNCONSCIOUS" PT WITH EPI-PEN  ** WASPS and YELLOW JACKETS ** per patient    REVIEW OF SYSTEMS:  Review of Systems  Constitutional: Negative.   Eyes:       Glasses  Respiratory: Negative.   Cardiovascular: Negative.   Gastrointestinal: Negative.   Genitourinary:       On lisinopril to protect renal function  Musculoskeletal: Positive for joint pain.  Skin:       Eczema   Neurological: Negative.   Endo/Heme/Allergies: Negative.   Psychiatric/Behavioral: Negative.     FAMILY HISTORY:        Family History  Problem Relation Age of Onset  . Atrial fibrillation Mother   . Diabetes type II Father     SOCIAL HISTORY:   Social History      Occupational History  . Not on file.        Social History Main Topics  .  Smoking status: Former Smoker    Packs/day: 0.50    Years: 40.00    Quit date: 08/21/2015  . Smokeless tobacco: Never Used  . Alcohol use 4.2 oz/week    7 Cans of beer per week  . Drug use: No  . Sexual activity: Not on file     EXAMINATION:  Vital signs in last 24 hours: BP 110/80   Pulse 70   Resp 12   Ht 5\' 9"  (1.753 m)   Wt 219 lb (99.3 kg)   BMI 32.34 kg/m   Physical Exam  Constitutional: He is oriented to person, place, and time. He appears well-developed and well-nourished.  HENT:  Head: Normocephalic and  atraumatic.  Eyes: EOM are normal. Pupils are equal, round, and reactive to light.  Neck: Neck supple.  No carotid bruits  Cardiovascular: Normal rate, regular rhythm, normal heart sounds and intact distal pulses.   Pulmonary/Chest: Effort normal.  Abdominal: Soft. Bowel sounds are normal.  Neurological: He is alert and oriented to person, place, and time.  Skin: Skin is warm and dry.  Psychiatric: He has a normal mood and affect. His behavior is normal. Judgment and thought content normal.     Right Hip Exam  Right hip exam is normal.    Left Hip Exam   Range of Motion  Extension: 0  Flexion: 100  Internal Rotation: 20  External Rotation: 20  Abduction: 20  Adduction: 20   Muscle Strength  The patient has normal left hip strength.   Other  Sensation: normal Pulse: present     Imaging Review Plain radiographs demonstrate moderate degenerative joint disease of the left hip. The bone quality appears to be good for age and reported activity level.  Assessment: End stage arthritis, left Hip      Past Medical History:  Diagnosis Date  . Arthritis   . Diabetes mellitus without complication (HCC)   . GERD (gastroesophageal reflux disease)    occ  . Hepatitis    12 yrs A water  . Hypertension   . Hypothyroidism     Plan: for left total hip replacement.  The patient history, physical examination, clinical  judgement of the provider and imaging studies are consistent with end stage degenerative joint disease of the left hip(s) and total hip arthroplasty is deemed medically necessary. The treatment options including medical management, injection therapy, arthroscopy and arthroplasty were discussed at length. The risks and benefits of total hip arthroplasty were presented and reviewed. The risks due to aseptic loosening, infection, stiffness, dislocation/subluxation,  thromboembolic complications and other imponderables were discussed.  The patient acknowledged the explanation, agreed to proceed with the plan. The clearance notes recently received were reviewed and concurs with proceeding then surgical intervention.  Patient is being admitted for inpatient treatment for surgery, pain control, PT, OT, prophylactic antibiotics, VTE prophylaxis, progressive ambulation and ADL's and discharge planning.The patient is planning to be discharged home with home health services   John CodeBrian Korey Arroyo 04/11/2016  13:42

## 2016-04-12 ENCOUNTER — Encounter (HOSPITAL_COMMUNITY): Payer: Self-pay | Admitting: Urology

## 2016-04-12 ENCOUNTER — Inpatient Hospital Stay (HOSPITAL_COMMUNITY): Payer: BC Managed Care – PPO | Admitting: Certified Registered Nurse Anesthetist

## 2016-04-12 ENCOUNTER — Inpatient Hospital Stay (HOSPITAL_COMMUNITY)
Admission: RE | Admit: 2016-04-12 | Discharge: 2016-04-13 | DRG: 470 | Disposition: A | Discharge status: Home / Self Care | Payer: BC Managed Care – PPO | Source: Ambulatory Visit | Attending: Internal Medicine | Admitting: Internal Medicine

## 2016-04-12 ENCOUNTER — Inpatient Hospital Stay (HOSPITAL_COMMUNITY): Payer: BC Managed Care – PPO

## 2016-04-12 ENCOUNTER — Encounter (HOSPITAL_COMMUNITY)
Admission: RE | Disposition: A | Discharge status: Home / Self Care | Payer: Self-pay | Source: Ambulatory Visit | Attending: Orthopaedic Surgery

## 2016-04-12 DIAGNOSIS — R002 Palpitations: Secondary | ICD-10-CM | POA: Diagnosis not present

## 2016-04-12 DIAGNOSIS — Z96642 Presence of left artificial hip joint: Secondary | ICD-10-CM

## 2016-04-12 DIAGNOSIS — I951 Orthostatic hypotension: Secondary | ICD-10-CM | POA: Diagnosis not present

## 2016-04-12 DIAGNOSIS — Z91030 Bee allergy status: Secondary | ICD-10-CM | POA: Diagnosis not present

## 2016-04-12 DIAGNOSIS — Z96641 Presence of right artificial hip joint: Secondary | ICD-10-CM | POA: Diagnosis present

## 2016-04-12 DIAGNOSIS — I10 Essential (primary) hypertension: Secondary | ICD-10-CM | POA: Diagnosis present

## 2016-04-12 DIAGNOSIS — E860 Dehydration: Secondary | ICD-10-CM | POA: Diagnosis not present

## 2016-04-12 DIAGNOSIS — R55 Syncope and collapse: Secondary | ICD-10-CM | POA: Diagnosis not present

## 2016-04-12 DIAGNOSIS — Z7984 Long term (current) use of oral hypoglycemic drugs: Secondary | ICD-10-CM | POA: Diagnosis not present

## 2016-04-12 DIAGNOSIS — Z833 Family history of diabetes mellitus: Secondary | ICD-10-CM | POA: Diagnosis not present

## 2016-04-12 DIAGNOSIS — K219 Gastro-esophageal reflux disease without esophagitis: Secondary | ICD-10-CM | POA: Diagnosis present

## 2016-04-12 DIAGNOSIS — E119 Type 2 diabetes mellitus without complications: Secondary | ICD-10-CM | POA: Diagnosis present

## 2016-04-12 DIAGNOSIS — Z96649 Presence of unspecified artificial hip joint: Secondary | ICD-10-CM

## 2016-04-12 DIAGNOSIS — R911 Solitary pulmonary nodule: Secondary | ICD-10-CM | POA: Diagnosis present

## 2016-04-12 DIAGNOSIS — Z79899 Other long term (current) drug therapy: Secondary | ICD-10-CM

## 2016-04-12 DIAGNOSIS — M1612 Unilateral primary osteoarthritis, left hip: Principal | ICD-10-CM | POA: Diagnosis present

## 2016-04-12 DIAGNOSIS — E039 Hypothyroidism, unspecified: Secondary | ICD-10-CM | POA: Diagnosis present

## 2016-04-12 DIAGNOSIS — R001 Bradycardia, unspecified: Secondary | ICD-10-CM | POA: Diagnosis not present

## 2016-04-12 DIAGNOSIS — Z7982 Long term (current) use of aspirin: Secondary | ICD-10-CM

## 2016-04-12 DIAGNOSIS — M1712 Unilateral primary osteoarthritis, left knee: Secondary | ICD-10-CM

## 2016-04-12 DIAGNOSIS — Z87891 Personal history of nicotine dependence: Secondary | ICD-10-CM | POA: Diagnosis not present

## 2016-04-12 DIAGNOSIS — M25552 Pain in left hip: Secondary | ICD-10-CM | POA: Diagnosis present

## 2016-04-12 HISTORY — PX: TOTAL HIP ARTHROPLASTY: SHX124

## 2016-04-12 LAB — CBC WITH DIFFERENTIAL/PLATELET
BASOS ABS: 0 10*3/uL (ref 0.0–0.1)
Basophils Relative: 0 %
EOS ABS: 0 10*3/uL (ref 0.0–0.7)
EOS PCT: 0 %
HCT: 38.5 % — ABNORMAL LOW (ref 39.0–52.0)
HEMOGLOBIN: 13 g/dL (ref 13.0–17.0)
LYMPHS ABS: 1.1 10*3/uL (ref 0.7–4.0)
LYMPHS PCT: 6 %
MCH: 28.3 pg (ref 26.0–34.0)
MCHC: 33.8 g/dL (ref 30.0–36.0)
MCV: 83.7 fL (ref 78.0–100.0)
Monocytes Absolute: 1.2 10*3/uL — ABNORMAL HIGH (ref 0.1–1.0)
Monocytes Relative: 7 %
NEUTROS PCT: 87 %
Neutro Abs: 16.2 10*3/uL — ABNORMAL HIGH (ref 1.7–7.7)
PLATELETS: 203 10*3/uL (ref 150–400)
RBC: 4.6 MIL/uL (ref 4.22–5.81)
RDW: 13.7 % (ref 11.5–15.5)
WBC: 18.5 10*3/uL — AB (ref 4.0–10.5)

## 2016-04-12 LAB — COMPREHENSIVE METABOLIC PANEL
ALT: 25 U/L (ref 17–63)
AST: 34 U/L (ref 15–41)
Albumin: 3.2 g/dL — ABNORMAL LOW (ref 3.5–5.0)
Alkaline Phosphatase: 62 U/L (ref 38–126)
Anion gap: 6 (ref 5–15)
BILIRUBIN TOTAL: 1.4 mg/dL — AB (ref 0.3–1.2)
BUN: 16 mg/dL (ref 6–20)
CHLORIDE: 107 mmol/L (ref 101–111)
CO2: 22 mmol/L (ref 22–32)
CREATININE: 0.76 mg/dL (ref 0.61–1.24)
Calcium: 8.7 mg/dL — ABNORMAL LOW (ref 8.9–10.3)
Glucose, Bld: 180 mg/dL — ABNORMAL HIGH (ref 65–99)
POTASSIUM: 4 mmol/L (ref 3.5–5.1)
Sodium: 135 mmol/L (ref 135–145)
TOTAL PROTEIN: 5.8 g/dL — AB (ref 6.5–8.1)

## 2016-04-12 LAB — GLUCOSE, CAPILLARY
GLUCOSE-CAPILLARY: 150 mg/dL — AB (ref 65–99)
GLUCOSE-CAPILLARY: 132 mg/dL — AB (ref 65–99)
GLUCOSE-CAPILLARY: 156 mg/dL — AB (ref 65–99)
GLUCOSE-CAPILLARY: 171 mg/dL — AB (ref 65–99)
Glucose-Capillary: 227 mg/dL — ABNORMAL HIGH (ref 65–99)

## 2016-04-12 LAB — MAGNESIUM: MAGNESIUM: 1.5 mg/dL — AB (ref 1.7–2.4)

## 2016-04-12 LAB — HEMOGLOBIN A1C
HEMOGLOBIN A1C: 6.5 % — AB (ref 4.8–5.6)
Mean Plasma Glucose: 140 mg/dL

## 2016-04-12 LAB — TROPONIN I

## 2016-04-12 SURGERY — ARTHROPLASTY, HIP, TOTAL,POSTERIOR APPROACH
Anesthesia: Monitor Anesthesia Care | Site: Hip | Laterality: Left

## 2016-04-12 MED ORDER — LIDOCAINE 2% (20 MG/ML) 5 ML SYRINGE
INTRAMUSCULAR | Status: DC | PRN
  Administered 2016-04-12: 20 mg via INTRAVENOUS

## 2016-04-12 MED ORDER — ONDANSETRON HCL 4 MG/2ML IJ SOLN: 4.0000 mg | Freq: Four times a day (QID) | INTRAMUSCULAR | Status: DC | PRN

## 2016-04-12 MED ORDER — LACTATED RINGERS IV SOLN
INTRAVENOUS | Status: DC | PRN
  Administered 2016-04-12 (×3): via INTRAVENOUS

## 2016-04-12 MED ORDER — OXYCODONE HCL 5 MG PO TABS
5.0000 mg | ORAL_TABLET | ORAL | Status: DC | PRN
  Administered 2016-04-12 (×2): 10 mg via ORAL
  Administered 2016-04-13: 5 mg via ORAL
  Administered 2016-04-13: 10 mg via ORAL
  Administered 2016-04-13: 5 mg via ORAL
  Administered 2016-04-13 (×2): 10 mg via ORAL
  Filled 2016-04-12: qty 1
  Filled 2016-04-12 (×2): qty 2
  Filled 2016-04-12: qty 1
  Filled 2016-04-12 (×3): qty 2

## 2016-04-12 MED ORDER — TRAMADOL HCL 50 MG PO TABS: 50.0000 mg | ORAL_TABLET | Freq: Three times a day (TID) | ORAL | Status: DC | PRN

## 2016-04-12 MED ORDER — SODIUM CHLORIDE 0.9 % IV SOLN
75.0000 mL/h | INTRAVENOUS | Status: DC
Start: 2016-04-12 — End: 2016-04-13
  Administered 2016-04-12 – 2016-04-13 (×2): 75 mL/h via INTRAVENOUS

## 2016-04-12 MED ORDER — INSULIN ASPART 100 UNIT/ML ~~LOC~~ SOLN
0.0000 [IU] | Freq: Every day | SUBCUTANEOUS | Status: DC
  Administered 2016-04-12: 2 [IU] via SUBCUTANEOUS

## 2016-04-12 MED ORDER — KETOROLAC TROMETHAMINE 15 MG/ML IJ SOLN
15.0000 mg | Freq: Four times a day (QID) | INTRAMUSCULAR | Status: AC
  Administered 2016-04-12 – 2016-04-13 (×4): 15 mg via INTRAVENOUS
  Filled 2016-04-12 (×3): qty 1

## 2016-04-12 MED ORDER — ONDANSETRON HCL 4 MG PO TABS: 4.0000 mg | ORAL_TABLET | Freq: Four times a day (QID) | ORAL | Status: DC | PRN

## 2016-04-12 MED ORDER — METHOCARBAMOL 500 MG PO TABS
ORAL_TABLET | ORAL | Status: AC
  Filled 2016-04-12: qty 1

## 2016-04-12 MED ORDER — GLIPIZIDE ER 5 MG PO TB24
5.0000 mg | ORAL_TABLET | Freq: Every day | ORAL | Status: DC
Start: 2016-04-12 — End: 2016-04-13
  Administered 2016-04-12 – 2016-04-13 (×2): 5 mg via ORAL
  Filled 2016-04-12 (×2): qty 1

## 2016-04-12 MED ORDER — METOPROLOL SUCCINATE ER 25 MG PO TB24
ORAL_TABLET | ORAL | Status: AC
  Administered 2016-04-12: 25 mg via ORAL
  Filled 2016-04-12: qty 1

## 2016-04-12 MED ORDER — FENTANYL CITRATE (PF) 100 MCG/2ML IJ SOLN
INTRAMUSCULAR | Status: AC
  Filled 2016-04-12: qty 2

## 2016-04-12 MED ORDER — BUPIVACAINE HCL (PF) 0.25 % IJ SOLN
INTRAMUSCULAR | Status: AC
  Filled 2016-04-12: qty 30

## 2016-04-12 MED ORDER — PHENYLEPHRINE HCL 10 MG/ML IJ SOLN
INTRAMUSCULAR | Status: DC | PRN
  Administered 2016-04-12: 25 ug/min via INTRAVENOUS

## 2016-04-12 MED ORDER — CLOBETASOL PROPIONATE 0.05 % EX CREA: 1.0000 "application " | TOPICAL_CREAM | Freq: Every day | CUTANEOUS | Status: DC | PRN

## 2016-04-12 MED ORDER — ACETAMINOPHEN 325 MG PO TABS: 650.0000 mg | ORAL_TABLET | Freq: Four times a day (QID) | ORAL | Status: DC | PRN

## 2016-04-12 MED ORDER — CEFAZOLIN IN D5W 1 GM/50ML IV SOLN
1.0000 g | Freq: Four times a day (QID) | INTRAVENOUS | Status: AC
  Administered 2016-04-12 (×2): 1 g via INTRAVENOUS
  Filled 2016-04-12 (×2): qty 50

## 2016-04-12 MED ORDER — CHLORHEXIDINE GLUCONATE 4 % EX LIQD: 60.0000 mL | Freq: Once | CUTANEOUS | Status: DC

## 2016-04-12 MED ORDER — FENTANYL CITRATE (PF) 100 MCG/2ML IJ SOLN: 25.0000 ug | INTRAMUSCULAR | Status: DC | PRN

## 2016-04-12 MED ORDER — HYDROMORPHONE HCL 2 MG/ML IJ SOLN
0.5000 mg | INTRAMUSCULAR | Status: DC | PRN
  Administered 2016-04-12 (×2): 1 mg via INTRAVENOUS
  Filled 2016-04-12 (×2): qty 1

## 2016-04-12 MED ORDER — METHOCARBAMOL 500 MG PO TABS
500.0000 mg | ORAL_TABLET | Freq: Four times a day (QID) | ORAL | Status: DC | PRN
  Administered 2016-04-12 (×2): 500 mg via ORAL
  Filled 2016-04-12 (×3): qty 1

## 2016-04-12 MED ORDER — ALUM & MAG HYDROXIDE-SIMETH 200-200-20 MG/5ML PO SUSP: 30.0000 mL | ORAL | Status: DC | PRN

## 2016-04-12 MED ORDER — MAGNESIUM HYDROXIDE 400 MG/5ML PO SUSP: 30.0000 mL | Freq: Every day | ORAL | Status: DC | PRN

## 2016-04-12 MED ORDER — MAGNESIUM CITRATE PO SOLN
1.0000 | Freq: Once | ORAL | Status: DC | PRN
Start: 2016-04-12 — End: 2016-04-13

## 2016-04-12 MED ORDER — 0.9 % SODIUM CHLORIDE (POUR BTL) OPTIME
TOPICAL | Status: DC | PRN
  Administered 2016-04-12 (×2): 1000 mL

## 2016-04-12 MED ORDER — METHOCARBAMOL 500 MG PO TABS: 500.0000 mg | ORAL_TABLET | Freq: Three times a day (TID) | ORAL | 0 refills | Status: DC | PRN

## 2016-04-12 MED ORDER — RIVAROXABAN 10 MG PO TABS
10.0000 mg | ORAL_TABLET | Freq: Every day | ORAL | Status: DC
  Administered 2016-04-13: 10 mg via ORAL
  Filled 2016-04-12: qty 1

## 2016-04-12 MED ORDER — DEXTROSE 5 % IV SOLN: 500.0000 mg | Freq: Four times a day (QID) | INTRAVENOUS | Status: DC | PRN

## 2016-04-12 MED ORDER — LIDOCAINE 2% (20 MG/ML) 5 ML SYRINGE
INTRAMUSCULAR | Status: AC
  Filled 2016-04-12: qty 5

## 2016-04-12 MED ORDER — VITAMIN D 1000 UNITS PO TABS
2000.0000 [IU] | ORAL_TABLET | Freq: Every day | ORAL | Status: DC
  Administered 2016-04-12 – 2016-04-13 (×2): 2000 [IU] via ORAL
  Filled 2016-04-12 (×2): qty 2

## 2016-04-12 MED ORDER — KETOROLAC TROMETHAMINE 15 MG/ML IJ SOLN
INTRAMUSCULAR | Status: AC
  Filled 2016-04-12: qty 1

## 2016-04-12 MED ORDER — OXYCODONE HCL 5 MG PO TABS
5.0000 mg | ORAL_TABLET | Freq: Once | ORAL | Status: AC | PRN
  Administered 2016-04-12: 5 mg via ORAL

## 2016-04-12 MED ORDER — ACETAMINOPHEN 10 MG/ML IV SOLN
1000.0000 mg | Freq: Four times a day (QID) | INTRAVENOUS | Status: AC
  Administered 2016-04-12 – 2016-04-13 (×4): 1000 mg via INTRAVENOUS
  Filled 2016-04-12 (×4): qty 100

## 2016-04-12 MED ORDER — RIVAROXABAN 10 MG PO TABS: 10.0000 mg | ORAL_TABLET | Freq: Every day | ORAL | 0 refills | Status: DC

## 2016-04-12 MED ORDER — METOPROLOL SUCCINATE ER 25 MG PO TB24
25.0000 mg | ORAL_TABLET | Freq: Every day | ORAL | Status: DC
  Administered 2016-04-12: 25 mg via ORAL
  Filled 2016-04-12: qty 1

## 2016-04-12 MED ORDER — PHENYLEPHRINE 40 MCG/ML (10ML) SYRINGE FOR IV PUSH (FOR BLOOD PRESSURE SUPPORT)
PREFILLED_SYRINGE | INTRAVENOUS | Status: AC
  Filled 2016-04-12: qty 10

## 2016-04-12 MED ORDER — ONDANSETRON HCL 4 MG/2ML IJ SOLN: 4.0000 mg | Freq: Once | INTRAMUSCULAR | Status: DC | PRN

## 2016-04-12 MED ORDER — LEVOTHYROXINE SODIUM 75 MCG PO TABS
75.0000 ug | ORAL_TABLET | Freq: Every day | ORAL | Status: DC
  Administered 2016-04-13: 75 ug via ORAL
  Filled 2016-04-12: qty 1

## 2016-04-12 MED ORDER — INSULIN ASPART 100 UNIT/ML ~~LOC~~ SOLN
0.0000 [IU] | Freq: Three times a day (TID) | SUBCUTANEOUS | Status: DC
  Administered 2016-04-12 – 2016-04-13 (×3): 3 [IU] via SUBCUTANEOUS

## 2016-04-12 MED ORDER — LISINOPRIL 10 MG PO TABS
10.0000 mg | ORAL_TABLET | Freq: Every day | ORAL | Status: DC
  Administered 2016-04-12: 10 mg via ORAL
  Filled 2016-04-12: qty 1

## 2016-04-12 MED ORDER — MENTHOL 3 MG MT LOZG: 1.0000 | LOZENGE | OROMUCOSAL | Status: DC | PRN

## 2016-04-12 MED ORDER — PROPOFOL 10 MG/ML IV BOLUS
INTRAVENOUS | Status: AC
  Filled 2016-04-12: qty 40

## 2016-04-12 MED ORDER — OXYCODONE HCL 5 MG PO TABS: 5.0000 mg | ORAL_TABLET | ORAL | 0 refills | Status: DC | PRN

## 2016-04-12 MED ORDER — PHENYLEPHRINE 40 MCG/ML (10ML) SYRINGE FOR IV PUSH (FOR BLOOD PRESSURE SUPPORT)
PREFILLED_SYRINGE | INTRAVENOUS | Status: DC | PRN
  Administered 2016-04-12: 40 ug via INTRAVENOUS
  Administered 2016-04-12: 80 ug via INTRAVENOUS
  Administered 2016-04-12: 40 ug via INTRAVENOUS

## 2016-04-12 MED ORDER — VITAMIN D3 50 MCG (2000 UT) PO CAPS: 2000.0000 [IU] | ORAL_CAPSULE | Freq: Every day | ORAL | Status: DC

## 2016-04-12 MED ORDER — MIDAZOLAM HCL 5 MG/5ML IJ SOLN
INTRAMUSCULAR | Status: DC | PRN
  Administered 2016-04-12 (×2): 1 mg via INTRAVENOUS

## 2016-04-12 MED ORDER — METOPROLOL SUCCINATE ER 25 MG PO TB24
25.0000 mg | ORAL_TABLET | Freq: Once | ORAL | Status: AC
  Administered 2016-04-12: 25 mg via ORAL
  Filled 2016-04-12: qty 1

## 2016-04-12 MED ORDER — PROPOFOL 500 MG/50ML IV EMUL
INTRAVENOUS | Status: DC | PRN
  Administered 2016-04-12: 75 ug/kg/min via INTRAVENOUS

## 2016-04-12 MED ORDER — METFORMIN HCL 500 MG PO TABS
1000.0000 mg | ORAL_TABLET | Freq: Two times a day (BID) | ORAL | Status: DC
  Administered 2016-04-13 (×2): 1000 mg via ORAL
  Filled 2016-04-12 (×2): qty 2

## 2016-04-12 MED ORDER — BUPIVACAINE HCL (PF) 0.25 % IJ SOLN
INTRAMUSCULAR | Status: DC | PRN
  Administered 2016-04-12: 30 mL

## 2016-04-12 MED ORDER — ACETAMINOPHEN 650 MG RE SUPP: 650.0000 mg | Freq: Four times a day (QID) | RECTAL | Status: DC | PRN

## 2016-04-12 MED ORDER — OXYCODONE HCL 5 MG/5ML PO SOLN: 5.0000 mg | Freq: Once | ORAL | Status: AC | PRN

## 2016-04-12 MED ORDER — METOCLOPRAMIDE HCL 5 MG/ML IJ SOLN: 5.0000 mg | Freq: Three times a day (TID) | INTRAMUSCULAR | Status: DC | PRN

## 2016-04-12 MED ORDER — MIDAZOLAM HCL 2 MG/2ML IJ SOLN
INTRAMUSCULAR | Status: AC
  Filled 2016-04-12: qty 2

## 2016-04-12 MED ORDER — EPINEPHRINE 0.3 MG/0.3ML IJ SOAJ: 0.3000 mg | INTRAMUSCULAR | Status: DC

## 2016-04-12 MED ORDER — PHENOL 1.4 % MT LIQD: 1.0000 | OROMUCOSAL | Status: DC | PRN

## 2016-04-12 MED ORDER — METOCLOPRAMIDE HCL 5 MG PO TABS: 5.0000 mg | ORAL_TABLET | Freq: Three times a day (TID) | ORAL | Status: DC | PRN

## 2016-04-12 MED ORDER — FENTANYL CITRATE (PF) 100 MCG/2ML IJ SOLN
INTRAMUSCULAR | Status: DC | PRN
  Administered 2016-04-12: 25 ug via INTRAVENOUS
  Administered 2016-04-12: 50 ug via INTRAVENOUS
  Administered 2016-04-12: 25 ug via INTRAVENOUS
  Administered 2016-04-12: 50 ug via INTRAVENOUS

## 2016-04-12 MED ORDER — DOCUSATE SODIUM 100 MG PO CAPS
100.0000 mg | ORAL_CAPSULE | Freq: Two times a day (BID) | ORAL | Status: DC
  Administered 2016-04-12 – 2016-04-13 (×3): 100 mg via ORAL
  Filled 2016-04-12 (×3): qty 1

## 2016-04-12 MED ORDER — BISACODYL 10 MG RE SUPP: 10.0000 mg | Freq: Every day | RECTAL | Status: DC | PRN

## 2016-04-12 MED ORDER — OXYCODONE HCL 5 MG PO TABS
ORAL_TABLET | ORAL | Status: AC
  Filled 2016-04-12: qty 1

## 2016-04-12 MED ORDER — MAGNESIUM SULFATE 4 GM/100ML IV SOLN
4.0000 g | Freq: Once | INTRAVENOUS | Status: AC
  Administered 2016-04-13: 4 g via INTRAVENOUS
  Filled 2016-04-12 (×2): qty 100

## 2016-04-12 MED ORDER — DIPHENHYDRAMINE HCL 12.5 MG/5ML PO ELIX: 12.5000 mg | ORAL_SOLUTION | ORAL | Status: DC | PRN

## 2016-04-12 SURGICAL SUPPLY — 60 items
BAG DECANTER FOR FLEXI CONT (MISCELLANEOUS) ×2 IMPLANT
BLADE SAW SAG 73X25 THK (BLADE) ×2
BLADE SAW SGTL 73X25 THK (BLADE) ×1 IMPLANT
BRUSH FEMORAL CANAL (MISCELLANEOUS) IMPLANT
CAPT HIP TOTAL 2 ×2 IMPLANT
COVER SURGICAL LIGHT HANDLE (MISCELLANEOUS) ×3 IMPLANT
DRAPE INCISE IOBAN 66X45 STRL (DRAPES) ×2 IMPLANT
DRAPE ORTHO SPLIT 77X108 STRL (DRAPES) ×6
DRAPE SURG ORHT 6 SPLT 77X108 (DRAPES) ×2 IMPLANT
DRSG MEPILEX BORDER 4X12 (GAUZE/BANDAGES/DRESSINGS) ×3 IMPLANT
DURAPREP 26ML APPLICATOR (WOUND CARE) ×6 IMPLANT
ELECT BLADE 4.0 EZ CLEAN MEGAD (MISCELLANEOUS) ×3
ELECT BLADE 6.5 EXT (BLADE) IMPLANT
ELECT REM PT RETURN 9FT ADLT (ELECTROSURGICAL) ×3
ELECTRODE BLDE 4.0 EZ CLN MEGD (MISCELLANEOUS) IMPLANT
ELECTRODE REM PT RTRN 9FT ADLT (ELECTROSURGICAL) ×1 IMPLANT
EVACUATOR 1/8 PVC DRAIN (DRAIN) IMPLANT
FACESHIELD WRAPAROUND (MASK) ×9 IMPLANT
FACESHIELD WRAPAROUND OR TEAM (MASK) ×3 IMPLANT
GLOVE BIOGEL PI IND STRL 8 (GLOVE) ×2 IMPLANT
GLOVE BIOGEL PI IND STRL 8.5 (GLOVE) ×1 IMPLANT
GLOVE BIOGEL PI INDICATOR 8 (GLOVE) ×4
GLOVE BIOGEL PI INDICATOR 8.5 (GLOVE) ×2
GLOVE ECLIPSE 8.0 STRL XLNG CF (GLOVE) ×6 IMPLANT
GLOVE SURG ORTHO 8.5 STRL (GLOVE) ×6 IMPLANT
GOWN STRL REUS W/ TWL LRG LVL3 (GOWN DISPOSABLE) ×2 IMPLANT
GOWN STRL REUS W/TWL 2XL LVL3 (GOWN DISPOSABLE) ×6 IMPLANT
GOWN STRL REUS W/TWL LRG LVL3 (GOWN DISPOSABLE) ×6
HANDPIECE INTERPULSE COAX TIP (DISPOSABLE)
IMMOBILIZER KNEE 20 (SOFTGOODS) IMPLANT
IMMOBILIZER KNEE 22 UNIV (SOFTGOODS) ×3 IMPLANT
KIT BASIN OR (CUSTOM PROCEDURE TRAY) ×3 IMPLANT
KIT ROOM TURNOVER OR (KITS) ×3 IMPLANT
MANIFOLD NEPTUNE II (INSTRUMENTS) ×3 IMPLANT
NEEDLE 22X1 1/2 (OR ONLY) (NEEDLE) ×3 IMPLANT
NS IRRIG 1000ML POUR BTL (IV SOLUTION) ×5 IMPLANT
PACK TOTAL JOINT (CUSTOM PROCEDURE TRAY) ×3 IMPLANT
PACK UNIVERSAL I (CUSTOM PROCEDURE TRAY) ×1 IMPLANT
PAD ARMBOARD 7.5X6 YLW CONV (MISCELLANEOUS) ×5 IMPLANT
PRESSURIZER FEMORAL UNIV (MISCELLANEOUS) IMPLANT
SET HNDPC FAN SPRY TIP SCT (DISPOSABLE) IMPLANT
STAPLER VISISTAT 35W (STAPLE) ×2 IMPLANT
SUCTION FRAZIER HANDLE 10FR (MISCELLANEOUS) ×2
SUCTION TUBE FRAZIER 10FR DISP (MISCELLANEOUS) ×1 IMPLANT
SUT BONE WAX W31G (SUTURE) IMPLANT
SUT ETHIBOND NAB CT1 #1 30IN (SUTURE) ×12 IMPLANT
SUT MNCRL AB 3-0 PS2 18 (SUTURE) ×3 IMPLANT
SUT VIC AB 0 CT1 27 (SUTURE) ×6
SUT VIC AB 0 CT1 27XBRD ANBCTR (SUTURE) ×2 IMPLANT
SUT VIC AB 1 CT1 27 (SUTURE) ×6
SUT VIC AB 1 CT1 27XBRD ANBCTR (SUTURE) ×2 IMPLANT
SUT VIC AB 2-0 CT1 27 (SUTURE) ×6
SUT VIC AB 2-0 CT1 TAPERPNT 27 (SUTURE) ×1 IMPLANT
SYR CONTROL 10ML LL (SYRINGE) ×3 IMPLANT
TOWEL OR 17X24 6PK STRL BLUE (TOWEL DISPOSABLE) ×3 IMPLANT
TOWEL OR 17X26 10 PK STRL BLUE (TOWEL DISPOSABLE) ×3 IMPLANT
TOWER CARTRIDGE SMART MIX (DISPOSABLE) IMPLANT
TRAY CATH 16FR W/PLASTIC CATH (SET/KITS/TRAYS/PACK) IMPLANT
WATER STERILE IRR 1000ML POUR (IV SOLUTION) ×12 IMPLANT
WRAP KNEE MAXI GEL POST OP (GAUZE/BANDAGES/DRESSINGS) ×2 IMPLANT
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
## 2016-04-12 NOTE — Op Note (Signed)
PATIENT ID:      Oswaldo MilianMark F Kruer  MRN:     [id number] DOB/AGE:    1955-10-25 / 60 y.o.       OPERATIVE REPORT    DATE OF PROCEDURE:  04/12/2016       PREOPERATIVE DIAGNOSIS:   LEFT HIP OSTEOARHTRITIS                                                       Estimated body mass index is 32.98 kg/m (pended) as calculated from the following:   Height as of 04/04/16: (P) 5\' 8"  (1.727 m).   Weight as of 04/04/16: (P) 216 lb 14.4 oz (98.4 kg).     POSTOPERATIVE DIAGNOSIS:   LEFT HIP OSTEOARHTRITIS                                                                     Estimated body mass index is 32.98 kg/m (pended) as calculated from the following:   Height as of 04/04/16: (P) 5\' 8"  (1.727 m).   Weight as of 04/04/16: (P) 216 lb 14.4 oz (98.4 kg).     PROCEDURE:  Procedure(s):LEFT TOTAL HIP ARTHROPLASTY     SURGEON:  Norlene CampbellPeter Daxten Kovalenko, MD    ASSISTANT:   Jacqualine CodeBrian Petrarca, PA-C   (Present and scrubbed throughout the case, critical for assistance with exposure, retraction, instrumentation, and closure.)          ANESTHESIA: spinal     DRAINS: none :      TOURNIQUET TIME: * No tourniquets in log *    COMPLICATIONS:  None   CONDITION:  stable  PROCEDURE IN DETAIL:    Valeria Batmaneter W Dorwin Fitzhenry 04/12/2016, 9:44 AM

## 2016-04-12 NOTE — Anesthesia Postprocedure Evaluation (Signed)
Anesthesia Post Note  Patient: John MilianMark F Coffey  Procedure(s) Performed: Procedure(s) (LRB): TOTAL HIP ARTHROPLASTY (Left)  Patient location during evaluation: PACU Anesthesia Type: MAC and Spinal Level of consciousness: awake, awake and alert and oriented Pain management: pain level controlled Vital Signs Assessment: post-procedure vital signs reviewed and stable Respiratory status: spontaneous breathing, nonlabored ventilation and respiratory function stable Cardiovascular status: blood pressure returned to baseline Anesthetic complications: no    Last Vitals:  Vitals:   04/12/16 1038 04/12/16 1052  BP: 97/70 92/66  Pulse: 90 64  Resp: 17 18  Temp:      Last Pain:  Vitals:   04/12/16 1052  PainSc: 0-No pain                 Special Ranes COKER

## 2016-04-12 NOTE — Progress Notes (Signed)
Rapid response was called after the patient experienced a syncope episode after getting up into the bedside chair with physical therapy. Patient turned pale, became profusely diaphoretic, vital signs were taken and patient was noted to be hypotensive, have bradycardia and his oxygen dropped to the high 80s. Patient was placed back into the bed and rapid response came to assess the patient. The patients doctor, Cleophas DunkerWhitfield, was contacted for patient update and new orders were taken. Patient does have a history of irregular heart rate, so the cause of the patients change is unknown at this time.  C.Ziere Docken, RN

## 2016-04-12 NOTE — Anesthesia Procedure Notes (Signed)
Procedure Name: MAC Date/Time: 04/12/2016 7:37 AM Performed by: Annabelle HarmanSMITH, Evon Dejarnett A Pre-anesthesia Checklist: Patient identified, Suction available, Emergency Drugs available and Patient being monitored Patient Re-evaluated:Patient Re-evaluated prior to inductionOxygen Delivery Method: Nasal cannula

## 2016-04-12 NOTE — Transfer of Care (Signed)
Immediate Anesthesia Transfer of Care Note  Patient: John MilianMark F Coffey  Procedure(s) Performed: Procedure(s): TOTAL HIP ARTHROPLASTY (Left)  Patient Location: PACU  Anesthesia Type:Spinal  Level of Consciousness: awake, alert , oriented and patient cooperative  Airway & Oxygen Therapy: Patient Spontanous Breathing and Patient connected to nasal cannula oxygen  Post-op Assessment: Report given to RN and Post -op Vital signs reviewed and stable  Post vital signs: Reviewed and stable  Last Vitals:  Vitals:   04/12/16 0619  BP: 120/65  Pulse: 79  Resp: 18  Temp: 36.6 C    Last Pain:  Vitals:   04/12/16 0607  PainSc: 4          Complications: No apparent anesthesia complications

## 2016-04-12 NOTE — Anesthesia Procedure Notes (Signed)
Spinal  Patient location during procedure: OR Start time: 04/12/2016 7:35 AM End time: 04/12/2016 7:40 AM Staffing Performed: anesthesiologist  Preanesthetic Checklist Completed: patient identified, site marked, surgical consent, pre-op evaluation, timeout performed, IV checked, risks and benefits discussed and monitors and equipment checked Spinal Block Patient position: sitting Prep: ChloraPrep Patient monitoring: heart rate, cardiac monitor, continuous pulse ox and blood pressure Approach: right paramedian Location: L3-4 Injection technique: single-shot Needle Needle type: Tuohy  Needle gauge: 22 G Needle length: 9 cm Assessment Sensory level: T6 Additional Notes 12 mg 0.75% Bupivacaine injected easily

## 2016-04-12 NOTE — H&P (Signed)
The recent History & Physical has been reviewed. I have personally examined the patient today. There is no interval change to the documented History & Physical. The patient would like to proceed with the procedure.  Valeria Batmaneter W Lerae Langham 04/12/2016,  7:10 AM

## 2016-04-12 NOTE — Significant Event (Signed)
Rapid Response Event Note  Overview: Time Called: 1750 Arrival Time: 1757 Event Type: Cardiac  Initial Focused Assessment: Patient oob with PT this afternoon. He stood up and ambulated around the bed to the chair.  As he was working with PT he became diaphoretic and per dinamap HR dropped to 30.  BP 90/60  RR 16  O2 sats 88% on RA, placed on 2l [facility] O2 sats 92%. Upon my arrival patient lying in bed, drowsy, but easy to arouse and easily conversant. Lung sounds clear, decreased bases. Heart tones occasionally irregular Skin pale, but dry   Interventions: Notified Dr Cleophas DunkerWhitfield of bradycardia.  Orders received 12 lead EKG done Placed on tele Plan Labs and Med consult BP 99/62  SB 49-62  RR 16  O2 sats 96% on 2l [place]   Plan of Care (if not transferred): RN to call if patient drops his heart rate into the 30s or lower again RN to call if assistance needed.     Event Summary: Name of Physician Notified: Whitfield at 1800    at    Outcome: Stayed in room and stabalized     [place], [place]

## 2016-04-12 NOTE — Op Note (Signed)
PATIENT ID:      John MilianMark F Coffey  MRN:     [id number] DOB/AGE:    [birth year] / 62 y.o.       OPERATIVE REPORT    DATE OF PROCEDURE:  04/12/2016       PREOPERATIVE DIAGNOSIS:   LEFT HIP OSTEOARHTRITIS                                                       Estimated body mass index is 32.98 kg/m (pended) as calculated from the following:   Height as of 04/04/16: (P) 5\' 8"  (1.727 m).   Weight as of 04/04/16: (P) 216 lb 14.4 oz (98.4 kg).     POSTOPERATIVE DIAGNOSIS:   LEFT HIP OSTEOARHTRITIS                                                                     Estimated body mass index is 32.98 kg/m (pended) as calculated from the following:   Height as of 04/04/16: (P) 5\' 8"  (1.727 m).   Weight as of 04/04/16: (P) 216 lb 14.4 oz (98.4 kg).     PROCEDURE:  Procedure(s):LEFT TOTAL HIP ARTHROPLASTY      SURGEON:  John CampbellPeter Evanny Ellerbe, MD    ASSISTANT:   John CodeBrian Petrarca, PA-C   (Present and scrubbed throughout the case, critical for assistance with exposure, retraction, instrumentation, and closure.)          ANESTHESIA: spinal and IV sedation     DRAINS: none :      TOURNIQUET TIME: * No tourniquets in log *    COMPLICATIONS:  None   CONDITION:  stable  PROCEDURE IN [id number]   [name] 04/12/2016, 10:31 AM

## 2016-04-12 NOTE — Anesthesia Preprocedure Evaluation (Signed)
Anesthesia Evaluation  Patient identified by MRN, date of birth, ID band Patient awake    Reviewed: Allergy & Precautions, NPO status , Patient's Chart, lab work & pertinent test results  Airway Mallampati: II  TM Distance: >3 FB Neck ROM: Full    Dental  (+) Teeth Intact, Dental Advisory Given   Pulmonary former smoker,    breath sounds clear to auscultation       Cardiovascular hypertension,  Rhythm:Regular Rate:Normal     Neuro/Psych    GI/Hepatic   Endo/Other  diabetes  Renal/GU      Musculoskeletal   Abdominal   Peds  Hematology   Anesthesia Other Findings   Reproductive/Obstetrics                             Anesthesia Physical Anesthesia Plan  ASA: III  Anesthesia Plan: MAC and Spinal   Post-op Pain Management:    Induction: Intravenous  Airway Management Planned: Natural Airway and Nasal Cannula  Additional Equipment:   Intra-op Plan:   Post-operative Plan:   Informed Consent: I have reviewed the patients History and Physical, chart, labs and discussed the procedure including the risks, benefits and alternatives for the proposed anesthesia with the patient or authorized representative who has indicated his/her understanding and acceptance.   Dental advisory given  Plan Discussed with: CRNA and Anesthesiologist  Anesthesia Plan Comments:         Anesthesia Quick Evaluation
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
## 2016-04-13 ENCOUNTER — Encounter (HOSPITAL_COMMUNITY): Payer: Self-pay | Admitting: Orthopaedic Surgery

## 2016-04-13 DIAGNOSIS — R002 Palpitations: Secondary | ICD-10-CM

## 2016-04-13 DIAGNOSIS — M1612 Unilateral primary osteoarthritis, left hip: Principal | ICD-10-CM

## 2016-04-13 DIAGNOSIS — R55 Syncope and collapse: Secondary | ICD-10-CM

## 2016-04-13 LAB — GLUCOSE, CAPILLARY
GLUCOSE-CAPILLARY: 119 mg/dL — AB (ref 65–99)
Glucose-Capillary: 188 mg/dL — ABNORMAL HIGH (ref 65–99)
Glucose-Capillary: 164 mg/dL — ABNORMAL HIGH (ref 65–99)

## 2016-04-13 LAB — BASIC METABOLIC PANEL
Anion gap: 8 (ref 5–15)
BUN: 14 mg/dL (ref 6–20)
CALCIUM: 8.4 mg/dL — AB (ref 8.9–10.3)
CO2: 22 mmol/L (ref 22–32)
Chloride: 104 mmol/L (ref 101–111)
Creatinine, Ser: 0.8 mg/dL (ref 0.61–1.24)
Glucose, Bld: 179 mg/dL — ABNORMAL HIGH (ref 65–99)
Potassium: 4.3 mmol/L (ref 3.5–5.1)
Sodium: 134 mmol/L — ABNORMAL LOW (ref 135–145)

## 2016-04-13 LAB — MAGNESIUM: MAGNESIUM: 2.3 mg/dL (ref 1.7–2.4)

## 2016-04-13 LAB — CBC
HEMATOCRIT: 38.8 % — AB (ref 39.0–52.0)
HEMOGLOBIN: 13 g/dL (ref 13.0–17.0)
MCH: 28.1 pg (ref 26.0–34.0)
MCHC: 33.5 g/dL (ref 30.0–36.0)
MCV: 83.8 fL (ref 78.0–100.0)
Platelets: 223 10*3/uL (ref 150–400)
RBC: 4.63 MIL/uL (ref 4.22–5.81)
RDW: 13.7 % (ref 11.5–15.5)
WBC: 14.1 10*3/uL — ABNORMAL HIGH (ref 4.0–10.5)

## 2016-04-13 LAB — HEMOGLOBIN A1C
Hgb A1c MFr Bld: 6.5 % — ABNORMAL HIGH (ref 4.8–5.6)
MEAN PLASMA GLUCOSE: 140 mg/dL

## 2016-04-13 MED ORDER — HYDROMORPHONE HCL 2 MG/ML IJ SOLN: 0.5000 mg | INTRAMUSCULAR | Status: DC | PRN
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
## 2016-04-13 NOTE — Op Note (Signed)
[name], [name] NO.:  [id number]  MEDICAL RECORD NO.:  [id number]  LOCATION:  5N12C                        FACILITY:  [facility]  PHYSICIAN:  Vonna Kotyk. Dashea Mcmullan, M.D.DATE OF BIRTH:  03-08-1956  DATE OF PROCEDURE:  04/12/2016 DATE OF DISCHARGE:                              OPERATIVE REPORT   PREOPERATIVE DIAGNOSIS:  Osteoarthritis, left hip.  POSTOPERATIVE DIAGNOSIS:  Osteoarthritis, left hip.  PROCEDURE:  Left total hip replacement.  SURGEON:  Vonna Kotyk. Durward Fortes, M.D.  ASSISTANT:  Aaron Edelman D. Petrarca, P.A.-C.  ANESTHESIA:  Spinal with IV sedation.  COMPLICATIONS:  None.  COMPONENTS:  DePuy AML large stature 15 mm femoral component, 36 mm outer diameter hip ball with a +5 neck length, Gription 3 metallic 52 mm outer diameter acetabular component with an apex hole eliminator, and a Marathon polyethylene liner +4 with a 10-degree posterior lip, components were press-fit.  DESCRIPTION OF PROCEDURE:  [name] is met with his wife in the holding area, identified the left hip as appropriate operative site and marked it accordingly.  The patient was then transported to room #7 and placed under spinal anesthesia and IV sedation.  He was then placed in the lateral decubitus position with the left side up and secured to the operating room table with the Innomed hip system.  Left hip was then prepped with chlorhexidine scrub and DuraPrep x2 from the iliac crest to the ankle.  Sterile draping was performed.  Time-out was called.  A routine Southern incision was utilized and via sharp dissection, carried down to subcutaneous tissue.  Gross bleeders were Bovie coagulated.  The IT band was incised along the skin incision.  Muscle fibers were then bluntly dissected to the level of the short external rotators.  Self-retaining retractor was inserted.  Short external rotators were then carefully incised.  Tendinous structures tagged with 0 Ethibond suture.  The capsule  was identified and incised along the femoral neck and head.  Minimal effusion was identified.  Hip was then nicely dislocated posteriorly.  At a point of fingerbreadth proximal to the lesser trochanter, the femoral neck was osteotomized with an oscillating saw.  A starter hole was then made in the piriformis fossa. Reaming was performed to 14.5 mm to accept a 15 mm component.  Rasping was performed sequentially to 15 mm large stature, had a very nice fit on the calcar, I did use a calcar reamer.  Retractors were then placed around the acetabulum.  Labrum was sharply excised.  The capsule was retracted with a suture.  Reaming was performed sequentially to 51 mm to accept a 52 component using the external guide.  I trialed a 50 and a 52 acetabular component.  We had nice seating with the 50, but not the 52.  Accordingly, inserted the final 52 mm outer diameter Gription 3 acetabular component using the external guide.  The trial polyethylene liner was inserted followed by the large stature 15 mm femoral rasp and 36 mm outer diameter hip ball with the +5 neck length.  The entire construct was reduced and through a full range of motion, it remained perfectly stable.  There was no toggling.  I thought  leg lengths were symmetrical.  Throughout the operative procedure, the femoral nerve was carefully identified and protected.  Trial components were removed.  The joint was irrigated with saline solution.  Apex hole eliminator was inserted followed by the polyethylene component.  The wound was again irrigated with saline solution.  The 50 mm femoral component was then carefully impacted on the femoral neck without complication.  The 36 mm outer diameter hip ball with a +5 neck length was then placed on a clean Morse taper neck.  The acetabulum was inspected and clear of any loose material.  The hip was then reduced and through a full range of motion, it was perfectly stable.  Joint  was again irrigated with saline solution.  We then applied tranexamic acid. The deep capsule was closed with a running 0 Ethibond.  We did inject 0.25% Marcaine without epinephrine into the joint.  The short external rotators closed with the similar material.  The IT band closed with a running #1 Vicryl.  Subcu closed in several layers with Vicryl and 3-0 Monocryl.  Skin closed with skin clips.  Sterile bulky dressing was applied, followed by a knee immobilizer.  The patient was then placed supine on the operating room stretcher and returned to the postanesthesia recovery room without complication.     Vonna Kotyk. Durward Fortes, M.D.     PWW/MEDQ  D:  04/12/2016  T:  04/13/2016  Job:  [id number]

## 2016-04-13 NOTE — Op Note (Signed)
[name], [name] NO.:  [id number]  MEDICAL RECORD NO.:  [id number]  LOCATION:  5N12C                        FACILITY:  [facility]  PHYSICIAN:  Vonna Kotyk. Chatham Howington, M.D.DATE OF BIRTH:  05-25-1956  DATE OF PROCEDURE:  04/12/2016 DATE OF DISCHARGE:                              OPERATIVE REPORT   PREOPERATIVE DIAGNOSIS:  Osteoarthritis, left hip.  POSTOPERATIVE DIAGNOSIS:  Osteoarthritis, left hip.  PROCEDURE:  Left total hip replacement.  SURGEON:  Vonna Kotyk. Durward Fortes, M.D.  ASSISTANT:  Aaron Edelman D. Petrarca, PA-C.  ANESTHESIA:  Spinal with mild IV sedation.  COMPLICATIONS:  None.  COMPONENTS:  DePuy AML large stature 15 mm femoral component, 36 mm outer diameter metallic hip ball with a +5 neck length, a 52 mm outer diameter Gription 3 metallic acetabular component with an apex hole eliminator and a Marathon polyethylene liner +4 with a 10-degree posterior lip.  Components were press-fit.  DESCRIPTION OF PROCEDURE:  [name] was met with his wife in the holding area, identified the left hip was appropriate operative site and marked it accordingly.  The patient was then transferred to room #7 and placed under spinal anesthesia without difficulty.  Time-out was called.  Foley catheter was inserted by the nursing staff.  Urine was clear.  The patient was then placed in the lateral decubitus position with the left side up and secured to the operating room table with the Innomed hip system.  Time-out was called again.  The left hip was then prepped with chlorhexidine scrub and DuraPrep x2 from the iliac crest to the ankle.  Sterile draping was performed.  A routine Southern incision was utilized and via sharp dissection, carried down through subcutaneous tissue.  Gross bleeders were Bovie coagulated.  The fascia was then incised along the IT band.  Muscle fibers were then separated to the level of the short external rotators. Self-retaining retractors were  inserted.  Throughout the procedure, I could palpate the sciatic nerve which was well out of harm's way.  Short external rotators were then carefully incised.  The tendinous structures were tagged with 0 Ethibond suture.  We placed more deeply always protecting being careful of close proximity to the sciatic nerve.  The capsule was then incised and the joint was entered.  There was minimal effusion.  The hip was then dislocated posteriorly and was osteotomized about a fingerbreadth proximal to the lesser trochanter using the oscillating saw.  A starter hole was then made in the piriformis fossa.  Reaming was performed to a 14.5 mm to accept a 15 component.  This was the same size used on the opposite side about 3-1/2 months ago.  Rasping was performed sequentially to 15-mm large stature.  I did use a calcar reamer to obtain the appropriate angle on the calcar.  The femoral canal was irrigated.  Retractors were then placed around the acetabulum.  The capsule was easily identified and tagged with 0 Ethibond suture.  We sharply excised the labrum.  Reaming was then performed sequentially to 51 mm to accept a 52-mm component.  The reaming was guided by the external guide.  I tried a 50-mm acetabular component and felt that  it fit nicely.  The 52 would had good rim fit but not completely seat.  Accordingly, I impacted the 52-mm outer diameter Gription 3 acetabular component using the external guide and had excellent alignment and excellent fit and purchase.  The trial polyethylene component was then inserted followed by the large stature 15-mm femoral rasp and then a 36-mm outer diameter hip ball with a +5 neck length.  The entire construct was reduced, there was no toggling, full range of motion without any evidence of instability.  I thought the leg lengths remained equal.  The trial components removed.  We irrigated the joint with saline solution.  Apex hole eliminator was  inserted followed by the final marathon polyethylene liner +4 with a 10-degree posterior lip.  Retractors were then placed around the femur.  I impacted the large stature 15-mm component nicely on the calcar.  We cleaned the [facility] taper neck and applied the +5 neck length, 36-mm outer diameter hip ball and this was reduced.  Through a full range of motion it was perfectly stable.  No toggling.  Tranexamic acid was then placed about the joint in the capsule.  We closed the capsule anatomically with a running 0 Ethibond suture.  A 0.25% Marcaine without epinephrine was injected in the deep wound and capsule.  Again, I checked to be sure that the sciatic nerve was well out of harm's way.  The short external rotators were closed with 0 Ethibond suture.  The IT band was closed with a running #1 Vicryl, the subcu with 2-0 Vicryl, 3-0 Monocryl and skin clips.  Sterile bulky dressing was applied.  The patient was then placed in the supine position.  Knee immobilizer placed to the left lower extremity.  The patient was then placed on the operating room stretcher, returned to the postanesthesia recovery room without complications.     Vonna Kotyk. Durward Fortes, M.D.     PWW/MEDQ  D:  04/12/2016  T:  04/13/2016  Job:  [id number]
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
## 2016-04-13 NOTE — Op Note (Signed)
NAMEADEM, [place] NO.:  [id number]  MEDICAL RECORD NO.:  [id number]  LOCATION:  [id number]                        FACILITY:  [facility]  PHYSICIAN:  Vonna Kotyk. Cyrah Mclamb, M.D.DATE OF BIRTH:  01-Jan-1956  DATE OF PROCEDURE:  04/12/2016 DATE OF DISCHARGE:                              OPERATIVE REPORT   PREOPERATIVE DIAGNOSIS:  Osteoarthritis, left hip.  POSTOPERATIVE DIAGNOSIS:  Osteoarthritis, left hip.  PROCEDURE:  Left total hip replacement.  SURGEON:  Vonna Kotyk. Durward Fortes, M.D.  ASSISTANT:  Aaron Edelman D. Petrarca, PA-C.  ANESTHESIA:  Spinal with IV sedation.  COMPLICATIONS:  None.  COMPONENTS:  DePuy AML large stature 15-mm femoral component with a 36- mm outer diameter hip ball, +5 neck length, a 52-mm outer diameter Gription 3 metallic acetabular component with an apex hole eliminator and a Marathon polyethylene liner +4 with a 10-degree posterior lip. Components were press-fit.  DESCRIPTION OF PROCEDURE:  Mr. Brodowski was met in the holding area with his wife and identified the left hip was appropriate operative site and marked it accordingly, any questions were answered.  The patient was then transported to room #7.  Spinal anesthesia was performed by Anesthesia.  IV sedation was also performed by Anesthesia.  The patient was then placed in the lateral decubitus position with the left side up and secured to the operating room table with the Innomed hip system.  The left lower extremity was then prepped with chlorhexidine scrub and DuraPrep x2 from iliac crest to the ankle. Sterile draping was performed.  Time-out was called.  A routine Southern incision was utilized and via sharp dissection, carried down through subcutaneous tissue.  Gross bleeders were Bovie coagulated.  IT band was incised.  Muscle fibers were then bluntly retracted.  Self-retaining retractors were inserted.  Manual dissection was carried down to the short external rotators.  These were  identified, incised along the posterior aspect of the greater trochanter.  The tendinous structures were tagged with 0 Ethibond suture.  The capsule was identified and incised along the femoral neck and head.  A small effusion was identified.  The capsule incision was elongated proximally and distally.  The hip was then easily dislocated.  The hip was then osteotomized at the femoral neck junction about a fingerbreadth proximal to the lesser trochanter.  A starter hole was then made in the piriformis fossa.  Then, reaming was performed to 15 mm.  I sequentially used the rasp to 15-mm large stature with a nice fit on the calcar.  Retractors were then placed about the acetabulum.  I sharply excised the labrum.  I carefully reamed to 51 mm to accept a 52-mm component using the external guide.  I then trialed a 50-mm acetabular component with a nice fit with the 52 would good rim fit, but would not completely seat.  The Gription 3 metallic acetabular component was then impacted into the acetabulum with the external guide.  The next fit was perfectly stable. We inserted the trial polyethylene component followed by the large stature 15-mm rasp.  We used a 36-mm outer diameter hip ball with a +5 neck length.  The entire construct was reduced and perfectly stable.  The trial components were removed.  The joint was copiously irrigated with saline solution.  Apex hole eliminator inserted followed by the part final marathon polyethylene liner.  The final femoral component was then impacted and flushed on the calcar. We cleaned the [facility] taper neck and applied 36-mm outer diameter hip ball with a +5 neck length.  The entire construct was reduced, making sure there was no loose material in the acetabulum.  We had an excellent fit without any instability.  Leg lengths appeared to be symmetrical. There was no toggling.  Tranexamic acid was then placed about the joint.  We injected 0.25% Marcaine  without epinephrine along the capsule and deep structures. Again, checked to be sure that the sciatic nerve was well out of harm's way.  The capsule was then closed with a running 0 Ethibond, short external rotators with the same material, IT band closed with a running #1 Vicryl subcu in several layers with Vicryl and 3-0 Monocryl.  Skin closed with skin clips.  Sterile bulky dressing was applied followed by the patient's knee immobilizer.  The patient was then placed supine and then onto the operating room stretcher without complications.     Vonna Kotyk. Durward Fortes, M.D.     PWW/MEDQ  D:  04/12/2016  T:  04/13/2016  Job:  [id number]

## 2016-04-13 NOTE — Discharge Summary (Signed)
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 side up under heel at all times except when in CPM or when walking.  DO NOT modify, tear, cut, or change the foam block in any way.  MAKE SURE YOU:  Understand these instructions.  Get help right away if you are not doing well or get worse.    Thank you for letting us be a part of your medical care team.  It is a privilege we respect greatly.  We hope these instructions will help you stay on track for a fast and full recovery!   Driving restrictions    Complete by:  As directed    No driving for 6 weeks   Follow the hip precautions as taught in Physical Therapy    Complete by:  As directed    Increase activity slowly as tolerated    Complete by:  As directed    Lifting restrictions    Complete by:  As directed    No lifting for 6 weeks   Patient may shower    Complete by:  As directed    You may shower over the brown dressing   TED hose    Complete by:  As directed    Use stockings (TED hose) for 3 weeks on left leg.  You may remove them at night for sleeping.   Weight bearing as tolerated    Complete by:  As directed    Laterality:  left   Extremity:  Lower         Medication List    STOP taking these medications   aspirin EC 81 MG tablet     TAKE these medications   clobetasol cream 0.05 % Commonly known as:  TEMOVATE Apply 1 application topically daily as needed (for rash).   EPIPEN 2-PAK 0.3 mg/0.3 mL Soaj injection Generic drug:  EPINEPHrine USE AS DIRECTED   Fish Oil 1000 MG Caps Take 1,000 mg by mouth daily.   glipiZIDE 5 MG  24 hr tablet Commonly known as:  GLUCOTROL XL Take 5 mg by mouth daily.   levothyroxine 75 MCG tablet Commonly known as:  SYNTHROID, LEVOTHROID Take 75 mcg by mouth daily before breakfast.   lisinopril 5 MG tablet Commonly known as:  PRINIVIL,ZESTRIL Take 1 tablet (5 mg total) by mouth daily. What changed:  how much to take   metFORMIN 500 MG tablet Commonly known as:  GLUCOPHAGE Take 1 tablet (500 mg total) by mouth 2 (two) times daily with a meal. What changed:  how much to take   methocarbamol 500 MG tablet Commonly known as:  ROBAXIN Take 1 tablet (500 mg total) by mouth every 8 (eight) hours as needed for muscle spasms.   metoprolol succinate 25 MG 24 hr tablet Commonly known as:  TOPROL-XL Take 25 mg by mouth daily.   multivitamin tablet Take 1 tablet by mouth daily.   oxyCODONE 5 MG immediate release tablet Commonly known as:  Oxy IR/ROXICODONE Take 1-2 tablets (5-10 mg total) by mouth every 3 (three) hours as needed for breakthrough pain.   rivaroxaban 10 MG Tabs tablet Commonly known as:  XARELTO Take 1 tablet (10 mg total) by mouth daily with breakfast.   tadalafil 10 MG tablet Commonly known as:  CIALIS Take 10 mg by mouth daily as needed for erectile dysfunction.   Vitamin D3 2000 units capsule Take 2,000 Units by mouth daily.          The results of significant diagnostics from this hospitalization (including imaging, microbiology, ancillary and laboratory) are listed below for reference.     Microbiology: Recent Results (from the past 240 hour(s))  Surgical pcr  screen     Status: None   Collection Time: 04/04/16  2:27 PM  Result Value Ref Range Status   MRSA, PCR NEGATIVE NEGATIVE Final   Staphylococcus aureus NEGATIVE NEGATIVE Final    Comment:        The Xpert SA Assay (FDA approved for NASAL specimens in patients over 62 years of age), is one component of a comprehensive surveillance program.  Test performance has been validated by [facility] for patients greater than or equal to 23 year old. It is not intended to diagnose infection nor to guide or monitor treatment.   Urine culture     Status: None   Collection Time: 04/04/16  3:14 PM  Result Value Ref Range Status   Specimen Description URINE, RANDOM  Final   Special Requests Normal  Final   Culture NO GROWTH  Final   Report Status 04/05/2016 FINAL  Final     Labs: Basic Metabolic Panel:  Recent Labs Lab 04/12/16 1935 04/13/16 0824  NA 135 134*  K 4.0 4.3  CL 107 104  CO2 22 22  GLUCOSE 180* 179*  BUN 16 14  CREATININE 0.76 0.80  CALCIUM 8.7* 8.4*  MG 1.5*  --    Liver Function Tests:  Recent Labs Lab 04/12/16 1935  AST 34  ALT 25  ALKPHOS 62  BILITOT 1.4*  PROT 5.8*  ALBUMIN 3.2*   CBC:  Recent Labs Lab 04/12/16 1935 04/13/16 0824  WBC 18.5* 14.1*  NEUTROABS 16.2*  --   HGB 13.0 13.0  HCT 38.5* 38.8*  MCV 83.7 83.8  PLT 203 223   Cardiac Enzymes:  Recent Labs Lab 04/12/16 1935  TROPONINI <0.03   CBG:  Recent Labs Lab 04/12/16 1009 04/12/16 1630 04/12/16 1752 04/12/16 2132 04/13/16 0625  GLUCAP 132* 156* 171* 227* 119*  SIGNED: Time coordinating discharge: 30 minutes  Debbora Presto, MD  [facility] 04/13/2016, 11:04 AM Pager [phone]  If 7PM-7AM, please contact night-coverage [email or web address] Password [email or web address]

## 2016-04-13 NOTE — Discharge Instructions (Signed)

## 2016-04-13 NOTE — Care Management Note (Signed)
Case Management Note  Patient Details  Name: [name] MRN: [id number] Date of Birth: 25-Jul-1955  Subjective/Objective:   60 yr old gentleman s/p left total hip arthroplasty.                  Action/Plan: Case manager spoke with patient's wife concerning discharge plans. [facility], and requested the therapist they have had with previous surgery- Timoteo GaulMelanie Shumaker. Case manager called referral to Janeice RobinsonKaren Nusbaumm, [facility] Liaison. Patient has all necessary DME at home.    Expected Discharge Date:    04/13/16              Expected Discharge Plan:  Home w Home Health Services  In-House Referral:     Discharge planning Services  CM Consult  Post Acute Care Choice:  Home Health Choice offered to:  Patient, Spouse  DME Arranged:  N/A DME Agency:  NA  HH Arranged:  PT HH Agency:  [facility]  Status of Service:  Completed, signed off  If discussed at Long Length of Stay Meetings, dates discussed:    Additional Comments:  Durenda GuthrieBrady, Kathryn Cosby Naomi, RN 04/13/2016, 12:19 PM

## 2016-04-15 ENCOUNTER — Telehealth (INDEPENDENT_AMBULATORY_CARE_PROVIDER_SITE_OTHER): Payer: Self-pay | Admitting: Orthopaedic Surgery

## 2016-04-15 NOTE — Telephone Encounter (Signed)
Patient's wife [name] Snowballngelina called about patient's disability paper work. Is it completed?

## 2016-04-18 ENCOUNTER — Telehealth (INDEPENDENT_AMBULATORY_CARE_PROVIDER_SITE_OTHER): Payer: Self-pay | Admitting: Orthopaedic Surgery

## 2016-04-18 NOTE — Telephone Encounter (Signed)
Patient wife states she spoke with Marylu LundJanet earlier and she would like to know about a form. She says she has a bad copy of the form. Please call.

## 2016-04-19 NOTE — Telephone Encounter (Signed)
Spoke with patient's wife and told her to I had to speak with PW and should be ready by end of day $15 fee

## 2016-04-25 ENCOUNTER — Ambulatory Visit (INDEPENDENT_AMBULATORY_CARE_PROVIDER_SITE_OTHER): Payer: BC Managed Care – PPO

## 2016-04-25 ENCOUNTER — Ambulatory Visit (INDEPENDENT_AMBULATORY_CARE_PROVIDER_SITE_OTHER): Payer: BC Managed Care – PPO | Admitting: Orthopaedic Surgery

## 2016-04-25 ENCOUNTER — Encounter (INDEPENDENT_AMBULATORY_CARE_PROVIDER_SITE_OTHER): Payer: Self-pay | Admitting: Orthopaedic Surgery

## 2016-04-25 VITALS — BP 109/84 | HR 78 | Resp 14 | Ht 68.0 in | Wt 219.0 lb

## 2016-04-25 DIAGNOSIS — M1612 Unilateral primary osteoarthritis, left hip: Secondary | ICD-10-CM | POA: Diagnosis not present

## 2016-04-25 DIAGNOSIS — M1611 Unilateral primary osteoarthritis, right hip: Secondary | ICD-10-CM

## 2016-04-25 MED ORDER — METHOCARBAMOL 500 MG PO TABS: 500.0000 mg | ORAL_TABLET | Freq: Three times a day (TID) | ORAL | 0 refills | Status: DC | PRN

## 2016-04-25 MED ORDER — OXYCODONE HCL 5 MG PO TABS: 5.0000 mg | ORAL_TABLET | ORAL | 0 refills | Status: DC | PRN
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
## 2016-05-09 ENCOUNTER — Encounter (INDEPENDENT_AMBULATORY_CARE_PROVIDER_SITE_OTHER): Payer: Self-pay | Admitting: Orthopaedic Surgery

## 2016-05-09 ENCOUNTER — Ambulatory Visit (INDEPENDENT_AMBULATORY_CARE_PROVIDER_SITE_OTHER): Payer: BC Managed Care – PPO | Admitting: Orthopaedic Surgery

## 2016-05-09 VITALS — BP 97/64 | HR 83 | Resp 14 | Ht 68.0 in | Wt 219.0 lb

## 2016-05-09 DIAGNOSIS — M1612 Unilateral primary osteoarthritis, left hip: Secondary | ICD-10-CM

## 2016-05-09 DIAGNOSIS — M1611 Unilateral primary osteoarthritis, right hip: Secondary | ICD-10-CM

## 2016-05-09 MED ORDER — OXYCODONE-ACETAMINOPHEN 10-325 MG PO TABS: 1.0000 | ORAL_TABLET | ORAL | 0 refills | Status: DC | PRN
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
## 2016-05-11 ENCOUNTER — Telehealth (INDEPENDENT_AMBULATORY_CARE_PROVIDER_SITE_OTHER): Payer: Self-pay | Admitting: Orthopaedic Surgery

## 2016-05-11 NOTE — Telephone Encounter (Signed)
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 requesting if you could please re-fax pt back-to-work paperwork or either email her the paperwork  at [email or web address]. Her phone # is [phone]

## 2016-05-12 NOTE — Telephone Encounter (Signed)
Spoke with patient's wife and Loraine LericheMark took original letter today. [name] Snowballngelina, wife is bringing FMLA paperwork today for me to fill out

## 2016-06-13 ENCOUNTER — Ambulatory Visit (INDEPENDENT_AMBULATORY_CARE_PROVIDER_SITE_OTHER): Payer: BC Managed Care – PPO | Admitting: Orthopaedic Surgery

## 2016-06-13 VITALS — Ht 68.0 in | Wt 219.0 lb

## 2016-06-13 DIAGNOSIS — Z96643 Presence of artificial hip joint, bilateral: Secondary | ICD-10-CM

## 2016-06-13 MED ORDER — CEPHALEXIN 500 MG PO CAPS: ORAL_CAPSULE | ORAL | 0 refills | Status: DC
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
## 2016-12-12 ENCOUNTER — Encounter (INDEPENDENT_AMBULATORY_CARE_PROVIDER_SITE_OTHER): Payer: Self-pay | Admitting: Orthopaedic Surgery

## 2016-12-12 ENCOUNTER — Ambulatory Visit (INDEPENDENT_AMBULATORY_CARE_PROVIDER_SITE_OTHER): Payer: BC Managed Care – PPO | Admitting: Orthopaedic Surgery

## 2016-12-12 VITALS — BP 132/85 | HR 80 | Ht 69.0 in | Wt 219.0 lb

## 2016-12-12 DIAGNOSIS — Z96641 Presence of right artificial hip joint: Secondary | ICD-10-CM | POA: Diagnosis not present

## 2016-12-12 DIAGNOSIS — Z96642 Presence of left artificial hip joint: Secondary | ICD-10-CM | POA: Diagnosis not present
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
## 2017-12-25 ENCOUNTER — Telehealth (INDEPENDENT_AMBULATORY_CARE_PROVIDER_SITE_OTHER): Payer: Self-pay | Admitting: Orthopaedic Surgery

## 2017-12-25 NOTE — Telephone Encounter (Signed)
I called dentist, I lmom for patient, RX should be take  4 pill prior to procedure - not 1 prior to procedure as written.

## 2017-12-25 NOTE — Telephone Encounter (Signed)
Synetta FailAnita from Dr. Zella BallSander's office (dentist) called requesting a return call.  Patient is there this morning and stated that he took the Keflex that was prescribed by Dr. Cleophas DunkerWhitfield.  We need to confirm the dosage.  Please call [phone]

## 2018-02-06 HISTORY — PX: OTHER SURGICAL HISTORY: SHX169

## 2018-02-23 HISTORY — PX: CARDIOVERSION: SHX1299

## 2018-06-13 ENCOUNTER — Telehealth (INDEPENDENT_AMBULATORY_CARE_PROVIDER_SITE_OTHER): Payer: Self-pay | Admitting: Orthopaedic Surgery

## 2018-06-13 ENCOUNTER — Other Ambulatory Visit: Payer: Self-pay | Admitting: *Deleted

## 2018-06-13 MED ORDER — CEPHALEXIN 500 MG PO CAPS: ORAL_CAPSULE | ORAL | 0 refills | Status: DC

## 2018-06-13 NOTE — Telephone Encounter (Signed)
FYI RX sent 

## 2018-06-13 NOTE — Telephone Encounter (Signed)
thanks

## 2018-06-13 NOTE — Telephone Encounter (Signed)
Patient called requesting prescription refill of Cephalexin 500 mg to be sent to [facility] on [address] in [place].   Patient states he needs to take 4 capsules prior to his dental procedure with a quantity of 20.  Patient needs to pick up prescription today at 12:00 for his 1:00 appointment at dentist.

## 2019-04-12 DIAGNOSIS — J189 Pneumonia, unspecified organism: Secondary | ICD-10-CM

## 2019-04-12 HISTORY — DX: Pneumonia, unspecified organism: J18.9

## 2019-06-14 ENCOUNTER — Telehealth: Payer: Self-pay | Admitting: *Deleted

## 2019-06-14 NOTE — Telephone Encounter (Signed)
Copied from CRM [id number]. Topic: General - Other >> Jun 14, 2019 10:58 AM [name] A wrote: Reason for CRM: Patient request that the new patient paper work is mailed out to them please. Also wife need to escort patient in to the room during his visit due to his medical issues and him forgetting what have been told to him.

## 2019-07-03 NOTE — Telephone Encounter (Signed)
NPP sent 07/03/2019

## 2019-07-25 HISTORY — PX: CARDIAC ELECTROPHYSIOLOGY MAPPING AND ABLATION: SHX1292

## 2019-08-12 ENCOUNTER — Emergency Department (INDEPENDENT_AMBULATORY_CARE_PROVIDER_SITE_OTHER)
Admission: EM | Admit: 2019-08-12 | Discharge: 2019-08-12 | Disposition: A | Discharge status: Home / Self Care | Payer: Medicare PPO | Source: Home / Self Care | Attending: Family Medicine | Admitting: Family Medicine

## 2019-08-12 ENCOUNTER — Other Ambulatory Visit: Payer: Self-pay

## 2019-08-12 DIAGNOSIS — L723 Sebaceous cyst: Secondary | ICD-10-CM | POA: Diagnosis not present

## 2019-08-12 DIAGNOSIS — L089 Local infection of the skin and subcutaneous tissue, unspecified: Secondary | ICD-10-CM

## 2019-08-12 MED ORDER — DOXYCYCLINE HYCLATE 100 MG PO CAPS: 100.0000 mg | ORAL_CAPSULE | Freq: Two times a day (BID) | ORAL | 0 refills | Status: DC
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
## 2019-08-12 NOTE — Discharge Instructions (Addendum)
May take Tylenol if needed for pain °

## 2019-08-12 NOTE — ED Triage Notes (Signed)
Pt also HOH -wears bilateral hearing aids

## 2019-08-12 NOTE — ED Triage Notes (Signed)
Sebaceous Cyst dx in 2011 on R scapulae Noted to be inflamed x 24 hours pink yesterday - red today - no limited ROM - unable to lay on r side

## 2019-08-12 NOTE — ED Notes (Signed)
Demographic sheet faxed to [facility] for appointment set up on 3/9/@ 1415, printed on pt AVs

## 2019-08-23 ENCOUNTER — Other Ambulatory Visit: Payer: Self-pay | Admitting: Adult Health

## 2019-08-23 ENCOUNTER — Encounter: Payer: Self-pay | Admitting: Family Medicine

## 2019-08-23 ENCOUNTER — Ambulatory Visit (INDEPENDENT_AMBULATORY_CARE_PROVIDER_SITE_OTHER): Payer: Medicare PPO | Admitting: Adult Health

## 2019-08-23 ENCOUNTER — Other Ambulatory Visit: Payer: Self-pay

## 2019-08-23 ENCOUNTER — Ambulatory Visit (INDEPENDENT_AMBULATORY_CARE_PROVIDER_SITE_OTHER): Payer: Medicare PPO

## 2019-08-23 ENCOUNTER — Telehealth: Payer: Self-pay | Admitting: Internal Medicine

## 2019-08-23 ENCOUNTER — Encounter: Payer: Self-pay | Admitting: Adult Health

## 2019-08-23 VITALS — BP 112/78 | Temp 97.2°F | Wt 197.0 lb

## 2019-08-23 DIAGNOSIS — R109 Unspecified abdominal pain: Secondary | ICD-10-CM

## 2019-08-23 LAB — CBC WITH DIFFERENTIAL/PLATELET
Basophils Absolute: 0.1 10*3/uL (ref 0.0–0.1)
Basophils Relative: 0.5 % (ref 0.0–3.0)
Eosinophils Absolute: 0.2 10*3/uL (ref 0.0–0.7)
Eosinophils Relative: 1.4 % (ref 0.0–5.0)
HCT: 45.3 % (ref 39.0–52.0)
Hemoglobin: 15.1 g/dL (ref 13.0–17.0)
Lymphocytes Relative: 13.7 % (ref 12.0–46.0)
Lymphs Abs: 1.7 10*3/uL (ref 0.7–4.0)
MCHC: 33.3 g/dL (ref 30.0–36.0)
MCV: 83.7 fl (ref 78.0–100.0)
Monocytes Absolute: 0.9 10*3/uL (ref 0.1–1.0)
Monocytes Relative: 7.2 % (ref 3.0–12.0)
Neutro Abs: 9.8 10*3/uL — ABNORMAL HIGH (ref 1.4–7.7)
Neutrophils Relative %: 77.2 % — ABNORMAL HIGH (ref 43.0–77.0)
Platelets: 257 10*3/uL (ref 150.0–400.0)
RBC: 5.41 Mil/uL (ref 4.22–5.81)
RDW: 15.6 % — ABNORMAL HIGH (ref 11.5–15.5)
WBC: 12.6 10*3/uL — ABNORMAL HIGH (ref 4.0–10.5)

## 2019-08-23 LAB — COMPREHENSIVE METABOLIC PANEL
ALT: 16 U/L (ref 0–53)
AST: 16 U/L (ref 0–37)
Albumin: 4.2 g/dL (ref 3.5–5.2)
Alkaline Phosphatase: 70 U/L (ref 39–117)
BUN: 16 mg/dL (ref 6–23)
CO2: 25 mEq/L (ref 19–32)
Calcium: 9.5 mg/dL (ref 8.4–10.5)
Chloride: 103 mEq/L (ref 96–112)
Creatinine, Ser: 0.93 mg/dL (ref 0.40–1.50)
GFR: 81.48 mL/min (ref >60.00)
Glucose, Bld: 116 mg/dL — ABNORMAL HIGH (ref 70–99)
Potassium: 4.5 mEq/L (ref 3.5–5.1)
Sodium: 135 mEq/L (ref 135–145)
Total Bilirubin: 1.1 mg/dL (ref 0.2–1.2)
Total Protein: 7.2 g/dL (ref 6.0–8.3)

## 2019-08-23 LAB — LIPASE: Lipase: 31 U/L (ref 11.0–59.0)

## 2019-08-23 LAB — AMYLASE: Amylase: 39 U/L (ref 27–131)

## 2019-08-23 MED ORDER — VALACYCLOVIR HCL 1 G PO TABS: 1000.0000 mg | ORAL_TABLET | Freq: Two times a day (BID) | ORAL | 0 refills | Status: DC

## 2019-08-23 NOTE — Addendum Note (Signed)
Addended by: Philemon Kingdom on: 08/23/2019 12:21 PM   Modules accepted: Orders
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
## 2019-08-23 NOTE — Telephone Encounter (Signed)
Pt has a new patient appt on 4/1/21with [facility]. Pt wife wants to know he can been seen today because he is having a shingles flare up.

## 2019-08-23 NOTE — Telephone Encounter (Signed)
Pt now scheduled to see Cory today at 11:30.  Nothing further needed. 

## 2019-08-28 ENCOUNTER — Encounter: Payer: Self-pay | Admitting: Family Medicine

## 2019-08-29 ENCOUNTER — Telehealth: Payer: Self-pay | Admitting: *Deleted

## 2019-08-29 NOTE — Telephone Encounter (Signed)
Patient called after hours line and caller stated that he is returning a call to [place] at the office.

## 2019-08-29 NOTE — Telephone Encounter (Signed)
Per DPR, spoke with patient's wife and read result note. Mrs. [name] verbalized understanding. Mrs. [name] stated that patient's flank pain is getting better. Mrs. [name] also wanted to know if office received all medical records from [facility]. She would like a cal back from [facility] to confirm.

## 2019-08-29 NOTE — Telephone Encounter (Signed)
Left a detailed message on identified voicemail informing [name] that we have access to [facility] medical records [facility]).  No need to send off for records.  Nothing further needed.

## 2019-09-05 ENCOUNTER — Ambulatory Visit (INDEPENDENT_AMBULATORY_CARE_PROVIDER_SITE_OTHER): Payer: Medicare PPO | Admitting: Adult Health

## 2019-09-05 ENCOUNTER — Encounter: Payer: Self-pay | Admitting: Adult Health

## 2019-09-05 ENCOUNTER — Other Ambulatory Visit: Payer: Self-pay

## 2019-09-05 VITALS — BP 106/66 | Temp 97.2°F | Wt 198.0 lb

## 2019-09-05 DIAGNOSIS — E039 Hypothyroidism, unspecified: Secondary | ICD-10-CM

## 2019-09-05 DIAGNOSIS — I10 Essential (primary) hypertension: Secondary | ICD-10-CM

## 2019-09-05 DIAGNOSIS — Z7689 Persons encountering health services in other specified circumstances: Secondary | ICD-10-CM | POA: Diagnosis not present

## 2019-09-05 DIAGNOSIS — I2510 Atherosclerotic heart disease of native coronary artery without angina pectoris: Secondary | ICD-10-CM

## 2019-09-05 DIAGNOSIS — K219 Gastro-esophageal reflux disease without esophagitis: Secondary | ICD-10-CM

## 2019-09-05 DIAGNOSIS — E118 Type 2 diabetes mellitus with unspecified complications: Secondary | ICD-10-CM

## 2019-09-05 DIAGNOSIS — E119 Type 2 diabetes mellitus without complications: Secondary | ICD-10-CM | POA: Diagnosis not present

## 2019-09-05 NOTE — Patient Instructions (Addendum)
It was great seeing you again   Please schedule a follow up for your physical exam in a few months   If you need anything please let me know
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
## 2019-09-09 ENCOUNTER — Encounter: Payer: Self-pay | Admitting: Adult Health

## 2019-09-09 DIAGNOSIS — L729 Follicular cyst of the skin and subcutaneous tissue, unspecified: Secondary | ICD-10-CM

## 2019-09-09 DIAGNOSIS — I2510 Atherosclerotic heart disease of native coronary artery without angina pectoris: Secondary | ICD-10-CM

## 2019-09-09 DIAGNOSIS — E119 Type 2 diabetes mellitus without complications: Secondary | ICD-10-CM

## 2019-09-10 MED ORDER — PANTOPRAZOLE SODIUM 40 MG PO TBEC
40.00 | DELAYED_RELEASE_TABLET | ORAL | Status: DC
Start: 2019-09-09 — End: 2019-09-10

## 2019-09-10 MED ORDER — CLOPIDOGREL BISULFATE 75 MG PO TABS
75.00 | ORAL_TABLET | ORAL | Status: DC
Start: 2019-09-09 — End: 2019-09-10

## 2019-09-10 MED ORDER — MORPHINE SULFATE 2 MG/ML IJ SOLN
2.00 | INTRAMUSCULAR | Status: DC
Start: ? — End: 2019-09-10

## 2019-09-10 MED ORDER — ATORVASTATIN CALCIUM 40 MG PO TABS
80.00 | ORAL_TABLET | ORAL | Status: DC
Start: 2019-09-08 — End: 2019-09-10

## 2019-09-10 MED ORDER — INSULIN LISPRO 100 UNIT/ML ~~LOC~~ SOLN
2.00 | SUBCUTANEOUS | Status: DC
Start: 2019-09-08 — End: 2019-09-10

## 2019-09-10 MED ORDER — GLUCOSE 40 % PO GEL
15.00 | ORAL | Status: DC
Start: ? — End: 2019-09-10

## 2019-09-10 MED ORDER — LISINOPRIL 5 MG PO TABS
2.50 | ORAL_TABLET | ORAL | Status: DC
Start: 2019-09-09 — End: 2019-09-10

## 2019-09-10 MED ORDER — SODIUM CHLORIDE FLUSH 0.9 % IV SOLN
5.00 | INTRAVENOUS | Status: DC
Start: 2019-09-08 — End: 2019-09-10

## 2019-09-10 MED ORDER — COLCHICINE 0.6 MG PO CAPS
0.60 | ORAL_CAPSULE | ORAL | Status: DC
Start: 2019-09-08 — End: 2019-09-10

## 2019-09-10 MED ORDER — GLUCAGON (RDNA) 1 MG IJ KIT
1.00 | PACK | INTRAMUSCULAR | Status: DC
Start: ? — End: 2019-09-10

## 2019-09-10 MED ORDER — ONDANSETRON 4 MG PO TBDP
4.00 | ORAL_TABLET | ORAL | Status: DC
Start: ? — End: 2019-09-10

## 2019-09-10 MED ORDER — SODIUM CHLORIDE FLUSH 0.9 % IV SOLN
5.00 | INTRAVENOUS | Status: DC
Start: ? — End: 2019-09-10

## 2019-09-10 MED ORDER — DEXTROSE 10 % IV SOLN
125.00 | INTRAVENOUS | Status: DC
Start: ? — End: 2019-09-10

## 2019-09-10 MED ORDER — ASPIRIN 81 MG PO TBEC
81.00 | DELAYED_RELEASE_TABLET | ORAL | Status: DC
Start: 2019-09-09 — End: 2019-09-10

## 2019-09-10 MED ORDER — LEVOTHYROXINE SODIUM 75 MCG PO TABS
75.00 | ORAL_TABLET | ORAL | Status: DC
Start: 2019-09-09 — End: 2019-09-10

## 2019-09-10 MED ORDER — ALUM & MAG HYDROXIDE-SIMETH 200-200-20 MG/5ML PO SUSP
30.00 | ORAL | Status: DC
Start: ? — End: 2019-09-10

## 2019-09-10 MED ORDER — GLIPIZIDE ER 5 MG PO TB24
5.00 | ORAL_TABLET | ORAL | Status: DC
Start: 2019-09-09 — End: 2019-09-10

## 2019-09-10 MED ORDER — ACETAMINOPHEN 325 MG PO TABS
650.00 | ORAL_TABLET | ORAL | Status: DC
Start: ? — End: 2019-09-10

## 2019-09-10 MED ORDER — METOPROLOL SUCCINATE ER 50 MG PO TB24
50.00 | ORAL_TABLET | ORAL | Status: DC
Start: 2019-09-09 — End: 2019-09-10

## 2019-09-10 MED ORDER — DSS 100 MG PO CAPS
100.00 | ORAL_CAPSULE | ORAL | Status: DC
Start: ? — End: 2019-09-10

## 2019-09-10 MED ORDER — DILTIAZEM HCL ER BEADS 180 MG PO CP24
180.00 | ORAL_CAPSULE | ORAL | Status: DC
Start: 2019-09-09 — End: 2019-09-10

## 2019-09-10 MED ORDER — FLUTICASONE PROPIONATE 50 MCG/ACT NA SUSP
2.00 | NASAL | Status: DC
Start: 2019-09-09 — End: 2019-09-10

## 2019-09-12 NOTE — Addendum Note (Signed)
Addended by: Raj Janus T on: 09/12/2019 11:28 AM   Modules accepted: Orders

## 2019-09-25 ENCOUNTER — Other Ambulatory Visit: Payer: Self-pay

## 2019-09-25 ENCOUNTER — Ambulatory Visit (INDEPENDENT_AMBULATORY_CARE_PROVIDER_SITE_OTHER): Payer: Medicare PPO | Admitting: Adult Health

## 2019-09-25 ENCOUNTER — Encounter: Payer: Self-pay | Admitting: Adult Health

## 2019-09-25 VITALS — BP 124/78 | HR 80 | Temp 97.6°F | Wt 196.2 lb

## 2019-09-25 DIAGNOSIS — Z5189 Encounter for other specified aftercare: Secondary | ICD-10-CM | POA: Diagnosis not present

## 2019-09-25 MED ORDER — CLOBETASOL PROPIONATE 0.05 % EX OINT: TOPICAL_OINTMENT | Freq: Two times a day (BID) | CUTANEOUS | 0 refills | Status: AC | PRN

## 2019-09-25 MED ORDER — LISINOPRIL 10 MG PO TABS: 10.0000 mg | ORAL_TABLET | Freq: Every day | ORAL | 1 refills | Status: DC

## 2019-09-25 MED ORDER — EPINEPHRINE 0.3 MG/0.3ML IJ SOAJ: INTRAMUSCULAR | 0 refills | Status: AC

## 2019-09-25 MED ORDER — GLIPIZIDE ER 5 MG PO TB24: 5.0000 mg | ORAL_TABLET | Freq: Every day | ORAL | 0 refills | Status: DC

## 2019-09-25 MED ORDER — ROSUVASTATIN CALCIUM 10 MG PO TABS: 10.0000 mg | ORAL_TABLET | Freq: Every day | ORAL | 1 refills | Status: DC

## 2019-09-25 MED ORDER — LEVOTHYROXINE SODIUM 75 MCG PO TABS: 75.0000 ug | ORAL_TABLET | Freq: Every day | ORAL | 1 refills | Status: DC

## 2019-09-25 MED ORDER — METFORMIN HCL 1000 MG PO TABS: 1000.0000 mg | ORAL_TABLET | Freq: Two times a day (BID) | ORAL | 0 refills | Status: DC
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
## 2019-10-14 ENCOUNTER — Encounter: Payer: Self-pay | Admitting: Adult Health

## 2019-10-17 ENCOUNTER — Encounter: Payer: Medicare PPO | Attending: Adult Health | Admitting: Dietician

## 2019-10-17 DIAGNOSIS — E118 Type 2 diabetes mellitus with unspecified complications: Secondary | ICD-10-CM | POA: Insufficient documentation

## 2019-10-17 NOTE — Patient Instructions (Signed)
Continue on your healthy journey Continue to stay active   Find ways to increase your vegetable intake.

## 2019-10-17 NOTE — Progress Notes (Signed)
Diabetes Self-Management Education  Visit Type: First/Initial  Appt. Start Time: 1315 Appt. End Time: 1515  10/26/2019  [name], identified by name and date of birth, is a 64 y.o. male with a diagnosis of Diabetes: Type 2.   ASSESSMENT Patient is here today with his wife [name].  She is a Engineer, civil (consulting).  He would like assistance in losing weight.  He has been using nutrition data on the Internet and keeping a diary of his calorie intake as well as carbohydrates, protein, fat, sodium, sodium, cholesterol. He has been trying to follow a diet < 1500 mg sodium, low in fat, balanced carbohydrate.  History includes Type 2 Diabetes diagnosed about 2013, CAD, emphysema, afib, hypothyroidism, bilateral hearing loss, OSA on C-pap.  He had an MI 09/06/2019 and stent. A1C was 6.4% 09/07/19 decreased from 7.6% 08/01/2019 and 13% when diagnosed. Medications include Metformin and glipizide which was recently held  Weight hx:   196 lbs in the office today (190 lbs at home) High of 250 lbs "years ago" Recent weight about 220 lbs  Patient lives with his wife.  They share the shopping and cooking and order everything on line.  He is a retired Electrical engineer.  Weight 196 lb (88.9 kg). Body mass index is 29.8 kg/m.  Diabetes Self-Management Education - 10/26/19 2231      Visit Information   Visit Type  First/Initial      Initial Visit   Diabetes Type  Type 2    Are you currently following a meal plan?  Yes    Are you taking your medications as prescribed?  Yes    Date Diagnosed  2013      Health Coping   How would you rate your overall health?  Fair      Psychosocial Assessment   Patient Belief/Attitude about Diabetes  Motivated to manage diabetes    Self-care barriers  None    Self-management support  Doctor's office;Family    Other persons present  Patient;Spouse/SO    Patient Concerns  Nutrition/Meal planning;Weight Control    Special Needs  None    Preferred Learning Style  No  preference indicated    Learning Readiness  Ready    How often do you need to have someone help you when you read instructions, pamphlets, or other written materials from your doctor or pharmacy?  2 - Rarely    What is the last grade level you completed in school?  BS plus 2 years      Pre-Education Assessment   Patient understands the diabetes disease and treatment process.  Needs Review    Patient understands incorporating nutritional management into lifestyle.  Needs Review    Patient undertands incorporating physical activity into lifestyle.  Needs Review    Patient understands using medications safely.  Needs Review    Patient understands monitoring blood glucose, interpreting and using results  Needs Review    Patient understands prevention, detection, and treatment of acute complications.  Needs Review    Patient understands prevention, detection, and treatment of chronic complications.  Needs Review    Patient understands how to develop strategies to address psychosocial issues.  Needs Review    Patient understands how to develop strategies to promote health/change behavior.  Needs Review      Complications   Last HgB A1C per patient/outside source  6.4 %    How often do you check your blood sugar?  1-2 times/day    Number of hypoglycemic  episodes per month  1    Can you tell when your blood sugar is low?  Yes    What do you do if your blood sugar is low?  juice    Have you had a dilated eye exam in the past 12 months?  Yes    Have you had a dental exam in the past 12 months?  Yes    Are you checking your feet?  Yes    How many days per week are you checking your feet?  5      Dietary Intake   Breakfast  frozen waffles with sugar free syrup, eggs, cheese    Dinner  [place] with rice and gravy, carrot sticks, corn, berries    Snack (evening)  Premier protein, crackers and cheese    Beverage(s)  water, unsweetened iced tea, crystal light, coffee with splenda and skim milk,  occasional diet soda      Exercise   Exercise Type  Light (walking / raking leaves)   He is waiting fora cardiac rehab spot, weights, treadmill, yardwork   How many days per week to you exercise?  7    How many minutes per day do you exercise?  30    Total minutes per week of exercise  210      Patient Education   Previous Diabetes Education  Yes (please comment)   2014   Disease state   Definition of diabetes, type 1 and 2, and the diagnosis of diabetes    Nutrition management   Meal options for control of blood glucose level and chronic complications.;Food label reading, portion sizes and measuring food.;Role of diet in the treatment of diabetes and the relationship between the three main macronutrients and blood glucose level    Physical activity and exercise   Role of exercise on diabetes management, blood pressure control and cardiac health.    Medications  Reviewed patients medication for diabetes, action, purpose, timing of dose and side effects.    Monitoring  Purpose and frequency of SMBG.;Identified appropriate SMBG and/or A1C goals.    Acute complications  Taught treatment of hypoglycemia - the 15 rule.    Chronic complications  Relationship between chronic complications and blood glucose control    Psychosocial adjustment  Worked with patient to identify barriers to care and solutions    Personal strategies to promote health  Lifestyle issues that need to be addressed for better diabetes care      Individualized Goals (developed by patient)   Nutrition  General guidelines for healthy choices and portions discussed    Physical Activity  Exercise 5-7 days per week;30 minutes per day    Medications  take my medication as prescribed    Monitoring   test my blood glucose as discussed    Reducing Risk  do foot checks daily;increase portions of healthy fats      Post-Education Assessment   Patient understands the diabetes disease and treatment process.  Demonstrates understanding /  competency    Patient understands incorporating nutritional management into lifestyle.  Demonstrates understanding / competency    Patient undertands incorporating physical activity into lifestyle.  Demonstrates understanding / competency    Patient understands using medications safely.  Demonstrates understanding / competency    Patient understands monitoring blood glucose, interpreting and using results  Demonstrates understanding / competency    Patient understands prevention, detection, and treatment of acute complications.  Demonstrates understanding / competency    Patient understands prevention, detection, and  treatment of chronic complications.  Demonstrates understanding / competency    Patient understands how to develop strategies to address psychosocial issues.  Demonstrates understanding / competency    Patient understands how to develop strategies to promote health/change behavior.  Demonstrates understanding / competency      Outcomes   Expected Outcomes  Demonstrated interest in learning. Expect positive outcomes    Future DMSE  PRN    Program Status  Completed       Individualized Plan for Diabetes Self-Management Training:   Learning Objective:  Patient will have a greater understanding of diabetes self-management. Patient education plan is to attend individual and/or group sessions per assessed needs and concerns.   Plan:   Patient Instructions  Continue on your healthy journey Continue to stay active   Find ways to increase your vegetable intake.    Expected Outcomes:  Demonstrated interest in learning. Expect positive outcomes  Education material provided: ADA - How to Thrive: A Guide for Your Journey with Diabetes, Meal plan card, Snack sheet and Diabetes Resources  If problems or questions, patient to contact team via:  Phone  Future DSME appointment: PRN

## 2019-10-24 ENCOUNTER — Encounter: Payer: Self-pay | Admitting: Adult Health

## 2019-10-24 ENCOUNTER — Other Ambulatory Visit: Payer: Self-pay

## 2019-10-24 ENCOUNTER — Ambulatory Visit (INDEPENDENT_AMBULATORY_CARE_PROVIDER_SITE_OTHER): Payer: Medicare PPO | Admitting: Adult Health

## 2019-10-24 VITALS — BP 110/72 | Temp 97.3°F | Ht 68.0 in | Wt 192.0 lb

## 2019-10-24 DIAGNOSIS — E039 Hypothyroidism, unspecified: Secondary | ICD-10-CM

## 2019-10-24 DIAGNOSIS — G4733 Obstructive sleep apnea (adult) (pediatric): Secondary | ICD-10-CM | POA: Insufficient documentation

## 2019-10-24 DIAGNOSIS — E119 Type 2 diabetes mellitus without complications: Secondary | ICD-10-CM

## 2019-10-24 DIAGNOSIS — I2510 Atherosclerotic heart disease of native coronary artery without angina pectoris: Secondary | ICD-10-CM

## 2019-10-24 DIAGNOSIS — K219 Gastro-esophageal reflux disease without esophagitis: Secondary | ICD-10-CM

## 2019-10-24 DIAGNOSIS — Z0000 Encounter for general adult medical examination without abnormal findings: Secondary | ICD-10-CM

## 2019-10-24 DIAGNOSIS — I10 Essential (primary) hypertension: Secondary | ICD-10-CM

## 2019-10-24 DIAGNOSIS — N400 Enlarged prostate without lower urinary tract symptoms: Secondary | ICD-10-CM | POA: Insufficient documentation

## 2019-10-24 DIAGNOSIS — I480 Paroxysmal atrial fibrillation: Secondary | ICD-10-CM

## 2019-10-24 LAB — LIPID PANEL
Cholesterol: 125 mg/dL (ref 0–200)
HDL: 41.9 mg/dL
LDL Cholesterol: 55 mg/dL (ref 0–99)
NonHDL: 82.84
Total CHOL/HDL Ratio: 3
Triglycerides: 141 mg/dL (ref 0.0–149.0)
VLDL: 28.2 mg/dL (ref 0.0–40.0)

## 2019-10-24 LAB — PSA: PSA: 0.65 ng/mL (ref 0.10–4.00)

## 2019-10-24 LAB — HEMOGLOBIN A1C: Hgb A1c MFr Bld: 6 % (ref 4.6–6.5)
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
## 2019-10-24 NOTE — Patient Instructions (Signed)
It was great seeing you today   We will follow up with you regarding your blood work   If everything looks good ,I will see you in 6 months   Please let me know if you need anything

## 2019-10-26 ENCOUNTER — Encounter: Payer: Self-pay | Admitting: Dietician

## 2019-11-07 ENCOUNTER — Other Ambulatory Visit: Payer: Self-pay | Admitting: Adult Health

## 2019-11-07 NOTE — Telephone Encounter (Signed)
Sent to the pharmacy by e-scribe. 

## 2019-12-02 ENCOUNTER — Encounter: Payer: Self-pay | Admitting: Adult Health

## 2019-12-03 MED ORDER — OMEPRAZOLE 40 MG PO CPDR: DELAYED_RELEASE_CAPSULE | ORAL | 0 refills | Status: DC

## 2019-12-03 MED ORDER — OMEPRAZOLE 40 MG PO CPDR: DELAYED_RELEASE_CAPSULE | ORAL | 2 refills | Status: DC

## 2019-12-20 ENCOUNTER — Emergency Department: Payer: Medicare PPO

## 2019-12-20 ENCOUNTER — Emergency Department
Admission: EM | Admit: 2019-12-20 | Discharge: 2019-12-21 | Disposition: A | Discharge status: Home / Self Care | Payer: Medicare PPO | Attending: Emergency Medicine | Admitting: Emergency Medicine

## 2019-12-20 ENCOUNTER — Other Ambulatory Visit: Payer: Self-pay

## 2019-12-20 DIAGNOSIS — Z96652 Presence of left artificial knee joint: Secondary | ICD-10-CM | POA: Insufficient documentation

## 2019-12-20 DIAGNOSIS — W1789XA Other fall from one level to another, initial encounter: Secondary | ICD-10-CM | POA: Diagnosis not present

## 2019-12-20 DIAGNOSIS — I700 Atherosclerosis of aorta: Secondary | ICD-10-CM | POA: Diagnosis not present

## 2019-12-20 DIAGNOSIS — Z7984 Long term (current) use of oral hypoglycemic drugs: Secondary | ICD-10-CM | POA: Diagnosis not present

## 2019-12-20 DIAGNOSIS — S0990XA Unspecified injury of head, initial encounter: Secondary | ICD-10-CM | POA: Diagnosis not present

## 2019-12-20 DIAGNOSIS — Z7901 Long term (current) use of anticoagulants: Secondary | ICD-10-CM | POA: Insufficient documentation

## 2019-12-20 DIAGNOSIS — Z79899 Other long term (current) drug therapy: Secondary | ICD-10-CM | POA: Insufficient documentation

## 2019-12-20 DIAGNOSIS — S22029A Unspecified fracture of second thoracic vertebra, initial encounter for closed fracture: Secondary | ICD-10-CM | POA: Insufficient documentation

## 2019-12-20 DIAGNOSIS — S22000A Wedge compression fracture of unspecified thoracic vertebra, initial encounter for closed fracture: Secondary | ICD-10-CM

## 2019-12-20 DIAGNOSIS — E119 Type 2 diabetes mellitus without complications: Secondary | ICD-10-CM | POA: Insufficient documentation

## 2019-12-20 DIAGNOSIS — I10 Essential (primary) hypertension: Secondary | ICD-10-CM | POA: Insufficient documentation

## 2019-12-20 DIAGNOSIS — I2510 Atherosclerotic heart disease of native coronary artery without angina pectoris: Secondary | ICD-10-CM | POA: Diagnosis not present

## 2019-12-20 DIAGNOSIS — S22019A Unspecified fracture of first thoracic vertebra, initial encounter for closed fracture: Secondary | ICD-10-CM | POA: Diagnosis not present

## 2019-12-20 DIAGNOSIS — Y9373 Activity, racquet and hand sports: Secondary | ICD-10-CM | POA: Diagnosis not present

## 2019-12-20 DIAGNOSIS — Z96651 Presence of right artificial knee joint: Secondary | ICD-10-CM | POA: Diagnosis not present

## 2019-12-20 DIAGNOSIS — T1490XA Injury, unspecified, initial encounter: Secondary | ICD-10-CM

## 2019-12-20 DIAGNOSIS — Z87891 Personal history of nicotine dependence: Secondary | ICD-10-CM | POA: Insufficient documentation

## 2019-12-20 DIAGNOSIS — J439 Emphysema, unspecified: Secondary | ICD-10-CM | POA: Insufficient documentation

## 2019-12-20 DIAGNOSIS — E039 Hypothyroidism, unspecified: Secondary | ICD-10-CM | POA: Insufficient documentation

## 2019-12-20 LAB — COMPREHENSIVE METABOLIC PANEL
ALT: 27 U/L (ref 0–44)
AST: 34 U/L (ref 15–41)
Albumin: 4 g/dL (ref 3.5–5.0)
Alkaline Phosphatase: 45 U/L (ref 38–126)
Anion gap: 9 (ref 5–15)
BUN: 27 mg/dL — ABNORMAL HIGH (ref 8–23)
CO2: 21 mmol/L — ABNORMAL LOW (ref 22–32)
Calcium: 8.9 mg/dL (ref 8.9–10.3)
Chloride: 107 mmol/L (ref 98–111)
Creatinine, Ser: 1.15 mg/dL (ref 0.61–1.24)
GFR calc Af Amer: >60 mL/min (ref >60)
GFR calc non Af Amer: >60 mL/min (ref >60)
Glucose, Bld: 197 mg/dL — ABNORMAL HIGH (ref 70–99)
Potassium: 4 mmol/L (ref 3.5–5.1)
Sodium: 137 mmol/L (ref 135–145)
Total Bilirubin: 1.5 mg/dL — ABNORMAL HIGH (ref 0.3–1.2)
Total Protein: 7.1 g/dL (ref 6.5–8.1)

## 2019-12-20 LAB — CBC
HCT: 40.8 % (ref 39.0–52.0)
Hemoglobin: 14.3 g/dL (ref 13.0–17.0)
MCH: 29.7 pg (ref 26.0–34.0)
MCHC: 35 g/dL (ref 30.0–36.0)
MCV: 84.8 fL (ref 80.0–100.0)
Platelets: 215 10*3/uL (ref 150–400)
RBC: 4.81 MIL/uL (ref 4.22–5.81)
RDW: 15 % (ref 11.5–15.5)
WBC: 12.5 10*3/uL — ABNORMAL HIGH (ref 4.0–10.5)
nRBC: 0 % (ref 0.0–0.2)

## 2019-12-20 LAB — GLUCOSE, CAPILLARY: Glucose-Capillary: 106 mg/dL — ABNORMAL HIGH (ref 70–99)

## 2019-12-20 MED ORDER — PANTOPRAZOLE SODIUM 40 MG PO TBEC
40.0000 mg | DELAYED_RELEASE_TABLET | Freq: Every day | ORAL | Status: DC
  Administered 2019-12-20: 40 mg via ORAL
  Filled 2019-12-20: qty 1

## 2019-12-20 MED ORDER — AMIODARONE HCL 200 MG PO TABS
200.0000 mg | ORAL_TABLET | Freq: Every day | ORAL | Status: DC
  Administered 2019-12-20: 200 mg via ORAL
  Filled 2019-12-20 (×2): qty 1

## 2019-12-20 MED ORDER — IOHEXOL 300 MG/ML  SOLN
100.0000 mL | Freq: Once | INTRAMUSCULAR | Status: AC | PRN
  Administered 2019-12-20: 100 mL via INTRAVENOUS

## 2019-12-20 MED ORDER — METFORMIN HCL 500 MG PO TABS
1000.0000 mg | ORAL_TABLET | Freq: Once | ORAL | Status: AC
  Administered 2019-12-20: 1000 mg via ORAL
  Filled 2019-12-20: qty 2

## 2019-12-20 NOTE — ED Notes (Addendum)
Pt blood sugar checked per wife request. Pt provided diet ginger ale to drink.
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
## 2019-12-20 NOTE — ED Notes (Signed)
Ct called. Wife concerned that pt has not had a CT yet. MD aware. MD stated to scan without labs.

## 2019-12-20 NOTE — ED Triage Notes (Signed)
Pt to the er for injuries sustained in a fall. Pt was playing disc golf when his disc became stuck in a tree. Pt threw a stick while looking up to knock the disc down and pt fell backward into a creekbed and hitting a rock in the shoulders/upper back. Pt reports brief loc. Pt is alert and oriented now. Vitals on truck 103/60, heart rate 65, 95%. Pt is on blood thinners.

## 2019-12-20 NOTE — ED Notes (Addendum)
Pt assisted to stand at side of bed and urinate. This okay'd with Md Roxan Hockey. Pt denies dizziness, reports pain decreased to 4/10 from 6/10. States "as time goes on the pain becomes less".

## 2019-12-20 NOTE — ED Notes (Signed)
Pt transported to MRI 

## 2019-12-20 NOTE — ED Provider Notes (Signed)
-----------------------------------------   11:20 PM on 12/20/2019 -----------------------------------------  Blood pressure 121/74, pulse (!) 56, temperature 97.6 F (36.4 C), temperature source Oral, resp. rate 17, height 5\' 8"  (1.727 m), weight 85.3 kg, SpO2 95 %.  Assuming care from Dr. .  In short, [name] is a 64 y.o. male with a chief complaint of Fall .  Refer to the original H&P for additional details.  The current plan of care is to follow-up MRI results for fall with severe neck pain.  ----------------------------------------- 1:07 AM on 12/21/2019 -----------------------------------------  MRI is significant for compression fractures of T1, T2, and likely T3. There is no retropulsion of fragments or other impact on the spinal cord, patient remains neurologically intact on reassessment. This would explain his pain following the fall and he would be appropriate for outpatient follow-up with either neurosurgery or orthopedics for this problem. He was prescribed a short course of Percocet as well as Lidoderm patches for pain control, states he has established relationship with neurosurgery and orthopedics back in [place], where he will plan on following up. He was counseled to return to the ED for new worsening symptoms, patient agrees with plan.    [place], MD 12/21/19 [phone]

## 2019-12-20 NOTE — ED Notes (Signed)
Pt transported to CT ?

## 2019-12-20 NOTE — ED Notes (Signed)
Pt removed from back board. C collar placed by Dr Cyril Loosen.

## 2019-12-21 MED ORDER — OXYCODONE-ACETAMINOPHEN 5-325 MG PO TABS: 1.0000 | ORAL_TABLET | ORAL | 0 refills | Status: DC | PRN

## 2019-12-21 MED ORDER — LIDOCAINE 5 % EX PTCH
1.0000 | MEDICATED_PATCH | Freq: Two times a day (BID) | CUTANEOUS | 0 refills | Status: DC
Start: 2019-12-21 — End: 2020-07-03

## 2020-03-07 ENCOUNTER — Other Ambulatory Visit: Payer: Self-pay | Admitting: Adult Health

## 2020-03-29 ENCOUNTER — Encounter: Payer: Self-pay | Admitting: Adult Health

## 2020-04-05 ENCOUNTER — Other Ambulatory Visit: Payer: Self-pay | Admitting: Adult Health

## 2020-04-21 ENCOUNTER — Ambulatory Visit: Payer: Medicare PPO

## 2020-05-01 ENCOUNTER — Encounter: Payer: Self-pay | Admitting: Adult Health

## 2020-05-04 MED ORDER — ACCU-CHEK AVIVA PLUS VI STRP: ORAL_STRIP | 12 refills | Status: DC

## 2020-05-04 MED ORDER — ACCU-CHEK AVIVA VI SOLN: 3 refills | Status: DC

## 2020-05-04 MED ORDER — ACCU-CHEK SOFTCLIX LANCETS MISC
12 refills | Status: DC
Start: 2020-05-04 — End: 2021-05-26

## 2020-05-15 ENCOUNTER — Telehealth: Payer: Self-pay | Admitting: Adult Health

## 2020-05-15 NOTE — Telephone Encounter (Signed)
Left message for patient to call back and schedule Medicare Annual Wellness Visit (AWVI) either virtually or in office.   ; please schedule at anytime with [facility] Nurse Health Advisor 1 or 2   This should be a 45 minute visit.

## 2020-06-20 ENCOUNTER — Other Ambulatory Visit: Payer: Self-pay | Admitting: Adult Health

## 2020-06-23 NOTE — Telephone Encounter (Signed)
Every 6 months

## 2020-06-23 NOTE — Telephone Encounter (Signed)
RX DENIED.  PT IS PAST A1C FOLLOW UP.

## 2020-06-23 NOTE — Telephone Encounter (Signed)
LAST SEEN 10/2019.  HOW OFTEN ARE YOU SEEING HIM FOR HIS A1C?

## 2020-06-25 ENCOUNTER — Encounter: Payer: Self-pay | Admitting: Adult Health

## 2020-06-26 MED ORDER — METFORMIN HCL 1000 MG PO TABS: 1000.0000 mg | ORAL_TABLET | Freq: Two times a day (BID) | ORAL | 0 refills | Status: DC

## 2020-07-03 ENCOUNTER — Other Ambulatory Visit: Payer: Self-pay

## 2020-07-03 ENCOUNTER — Encounter: Payer: Self-pay | Admitting: Adult Health

## 2020-07-03 ENCOUNTER — Ambulatory Visit: Payer: Medicare PPO | Admitting: Adult Health

## 2020-07-03 VITALS — BP 126/78 | Wt 199.0 lb

## 2020-07-03 DIAGNOSIS — E119 Type 2 diabetes mellitus without complications: Secondary | ICD-10-CM

## 2020-07-03 DIAGNOSIS — I10 Essential (primary) hypertension: Secondary | ICD-10-CM

## 2020-07-03 LAB — POCT GLYCOSYLATED HEMOGLOBIN (HGB A1C): HbA1c, POC (controlled diabetic range): 7 % (ref 0.0–7.0)

## 2020-07-03 MED ORDER — METFORMIN HCL 1000 MG PO TABS: 1000.0000 mg | ORAL_TABLET | Freq: Two times a day (BID) | ORAL | 0 refills | Status: DC
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
## 2020-07-11 ENCOUNTER — Other Ambulatory Visit: Payer: Self-pay | Admitting: Adult Health

## 2020-07-14 NOTE — Telephone Encounter (Signed)
Sent to the pharmacy by e-scribe. 

## 2020-09-17 ENCOUNTER — Encounter: Payer: Self-pay | Admitting: Adult Health

## 2020-09-17 ENCOUNTER — Other Ambulatory Visit: Payer: Self-pay | Admitting: Adult Health

## 2020-09-17 DIAGNOSIS — E119 Type 2 diabetes mellitus without complications: Secondary | ICD-10-CM

## 2020-09-17 MED ORDER — ROSUVASTATIN CALCIUM 10 MG PO TABS: 10.0000 mg | ORAL_TABLET | Freq: Every day | ORAL | 1 refills | Status: DC

## 2020-09-17 MED ORDER — LEVOTHYROXINE SODIUM 75 MCG PO TABS: 75.0000 ug | ORAL_TABLET | Freq: Every day | ORAL | 1 refills | Status: DC

## 2020-09-17 MED ORDER — METFORMIN HCL 1000 MG PO TABS: 1000.0000 mg | ORAL_TABLET | Freq: Two times a day (BID) | ORAL | 0 refills | Status: DC

## 2020-09-17 MED ORDER — LISINOPRIL 10 MG PO TABS: 10.0000 mg | ORAL_TABLET | Freq: Every day | ORAL | 1 refills | Status: DC

## 2020-09-24 ENCOUNTER — Telehealth: Payer: Self-pay | Admitting: *Deleted

## 2020-09-24 NOTE — Telephone Encounter (Signed)
[employer] faxed a refill request for Metoprolol Er Suff 50mg  tablets.  Message sent to PCP as Rx listed as a historical med.

## 2020-09-25 MED ORDER — METOPROLOL SUCCINATE ER 50 MG PO TB24: 50.0000 mg | ORAL_TABLET | Freq: Every day | ORAL | 3 refills | Status: DC

## 2020-10-04 ENCOUNTER — Encounter: Payer: Self-pay | Admitting: Adult Health

## 2020-10-10 ENCOUNTER — Other Ambulatory Visit: Payer: Self-pay | Admitting: Adult Health

## 2020-10-19 ENCOUNTER — Telehealth: Payer: Self-pay

## 2020-10-19 ENCOUNTER — Other Ambulatory Visit: Payer: Self-pay

## 2020-10-19 MED ORDER — CEPHALEXIN 500 MG PO CAPS: ORAL_CAPSULE | ORAL | 0 refills | Status: DC

## 2020-10-19 NOTE — Telephone Encounter (Signed)
Patient called he stated he has a dental appointment at 4:00  and he doesn't have any cephalexin he is requesting a refill call [phone]

## 2020-10-19 NOTE — Telephone Encounter (Signed)
Tried to call patient back. No answer. Voicemail is full. I have sent refill to his pharmacy on file.

## 2020-10-27 ENCOUNTER — Ambulatory Visit: Payer: Medicare PPO | Admitting: Adult Health

## 2020-11-30 ENCOUNTER — Other Ambulatory Visit: Payer: Self-pay | Admitting: Adult Health

## 2020-11-30 DIAGNOSIS — E119 Type 2 diabetes mellitus without complications: Secondary | ICD-10-CM

## 2020-12-28 ENCOUNTER — Other Ambulatory Visit: Payer: Self-pay

## 2020-12-29 ENCOUNTER — Encounter: Payer: Self-pay | Admitting: Adult Health

## 2020-12-29 ENCOUNTER — Ambulatory Visit (INDEPENDENT_AMBULATORY_CARE_PROVIDER_SITE_OTHER): Payer: Medicare PPO | Admitting: Adult Health

## 2020-12-29 VITALS — BP 100/82 | HR 72 | Temp 97.7°F | Ht 68.25 in | Wt 205.0 lb

## 2020-12-29 DIAGNOSIS — I2510 Atherosclerotic heart disease of native coronary artery without angina pectoris: Secondary | ICD-10-CM | POA: Diagnosis not present

## 2020-12-29 DIAGNOSIS — E119 Type 2 diabetes mellitus without complications: Secondary | ICD-10-CM

## 2020-12-29 DIAGNOSIS — N400 Enlarged prostate without lower urinary tract symptoms: Secondary | ICD-10-CM

## 2020-12-29 DIAGNOSIS — G4733 Obstructive sleep apnea (adult) (pediatric): Secondary | ICD-10-CM

## 2020-12-29 DIAGNOSIS — I10 Essential (primary) hypertension: Secondary | ICD-10-CM

## 2020-12-29 DIAGNOSIS — Z1159 Encounter for screening for other viral diseases: Secondary | ICD-10-CM

## 2020-12-29 DIAGNOSIS — K219 Gastro-esophageal reflux disease without esophagitis: Secondary | ICD-10-CM

## 2020-12-29 DIAGNOSIS — I480 Paroxysmal atrial fibrillation: Secondary | ICD-10-CM

## 2020-12-29 DIAGNOSIS — Z0000 Encounter for general adult medical examination without abnormal findings: Secondary | ICD-10-CM | POA: Diagnosis not present

## 2020-12-29 DIAGNOSIS — E039 Hypothyroidism, unspecified: Secondary | ICD-10-CM

## 2020-12-29 LAB — CBC WITH DIFFERENTIAL/PLATELET
Basophils Absolute: 0.1 10*3/uL (ref 0.0–0.1)
Basophils Relative: 0.5 % (ref 0.0–3.0)
Eosinophils Absolute: 0.2 10*3/uL (ref 0.0–0.7)
Eosinophils Relative: 2 % (ref 0.0–5.0)
HCT: 42.7 % (ref 39.0–52.0)
Hemoglobin: 14 g/dL (ref 13.0–17.0)
Lymphocytes Relative: 17 % (ref 12.0–46.0)
Lymphs Abs: 2.1 10*3/uL (ref 0.7–4.0)
MCHC: 32.8 g/dL (ref 30.0–36.0)
MCV: 86.6 fl (ref 78.0–100.0)
Monocytes Absolute: 0.9 10*3/uL (ref 0.1–1.0)
Monocytes Relative: 6.8 % (ref 3.0–12.0)
Neutro Abs: 9.3 10*3/uL — ABNORMAL HIGH (ref 1.4–7.7)
Neutrophils Relative %: 73.7 % (ref 43.0–77.0)
Platelets: 254 10*3/uL (ref 150.0–400.0)
RBC: 4.93 Mil/uL (ref 4.22–5.81)
RDW: 15.7 % — ABNORMAL HIGH (ref 11.5–15.5)
WBC: 12.6 10*3/uL — ABNORMAL HIGH (ref 4.0–10.5)

## 2020-12-29 LAB — COMPREHENSIVE METABOLIC PANEL
ALT: 15 U/L (ref 0–53)
AST: 16 U/L (ref 0–37)
Albumin: 4.4 g/dL (ref 3.5–5.2)
Alkaline Phosphatase: 55 U/L (ref 39–117)
BUN: 21 mg/dL (ref 6–23)
CO2: 23 mEq/L (ref 19–32)
Calcium: 9.4 mg/dL (ref 8.4–10.5)
Chloride: 105 mEq/L (ref 96–112)
Creatinine, Ser: 0.92 mg/dL (ref 0.40–1.50)
GFR: 86.61 mL/min (ref >60.00)
Glucose, Bld: 102 mg/dL — ABNORMAL HIGH (ref 70–99)
Potassium: 3.9 mEq/L (ref 3.5–5.1)
Sodium: 140 mEq/L (ref 135–145)
Total Bilirubin: 1.4 mg/dL — ABNORMAL HIGH (ref 0.2–1.2)
Total Protein: 7.4 g/dL (ref 6.0–8.3)

## 2020-12-29 LAB — LIPID PANEL
Cholesterol: 120 mg/dL (ref 0–200)
HDL: 41.4 mg/dL (ref >39.00)
LDL Cholesterol: 55 mg/dL (ref 0–99)
NonHDL: 78.33
Total CHOL/HDL Ratio: 3
Triglycerides: 115 mg/dL (ref 0.0–149.0)
VLDL: 23 mg/dL (ref 0.0–40.0)

## 2020-12-29 LAB — TSH: TSH: 1.94 u[IU]/mL (ref 0.35–5.50)

## 2020-12-29 LAB — PSA: PSA: 0.79 ng/mL (ref 0.10–4.00)

## 2020-12-29 LAB — HEMOGLOBIN A1C: Hgb A1c MFr Bld: 7.5 % — ABNORMAL HIGH (ref 4.6–6.5)

## 2020-12-29 NOTE — Addendum Note (Signed)
Addended by: Kandra Nicolas on: 12/29/2020 10:22 AM   Modules accepted: Orders

## 2020-12-29 NOTE — Patient Instructions (Signed)
It was great seeing you today   We will follow up with you regarding your lab work   Work on reducing carbs   We will see you back in three months
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
## 2020-12-30 LAB — HEPATITIS C ANTIBODY
Hepatitis C Ab: NONREACTIVE
SIGNAL TO CUT-OFF: 0.02 (ref <1.00)

## 2020-12-30 MED ORDER — GLIPIZIDE ER 5 MG PO TB24: 5.0000 mg | ORAL_TABLET | Freq: Every day | ORAL | 0 refills | Status: DC

## 2020-12-30 MED ORDER — GLIPIZIDE 5 MG PO TABS: 5.0000 mg | ORAL_TABLET | Freq: Two times a day (BID) | ORAL | 3 refills | Status: DC

## 2020-12-30 NOTE — Addendum Note (Signed)
Addended by: Waymon Amato R on: 12/30/2020 07:59 AM   Modules accepted: Orders

## 2020-12-31 ENCOUNTER — Encounter: Payer: Self-pay | Admitting: Adult Health

## 2021-01-01 ENCOUNTER — Encounter: Payer: Self-pay | Admitting: Adult Health

## 2021-01-05 NOTE — Telephone Encounter (Signed)
Please advise 

## 2021-02-04 ENCOUNTER — Other Ambulatory Visit: Payer: Self-pay

## 2021-02-04 ENCOUNTER — Ambulatory Visit (INDEPENDENT_AMBULATORY_CARE_PROVIDER_SITE_OTHER): Payer: Medicare PPO

## 2021-02-04 ENCOUNTER — Encounter: Payer: Self-pay | Admitting: Orthopaedic Surgery

## 2021-02-04 ENCOUNTER — Ambulatory Visit: Payer: Medicare PPO | Admitting: Orthopaedic Surgery

## 2021-02-04 DIAGNOSIS — M1611 Unilateral primary osteoarthritis, right hip: Secondary | ICD-10-CM

## 2021-02-04 DIAGNOSIS — M25552 Pain in left hip: Secondary | ICD-10-CM | POA: Diagnosis not present

## 2021-02-04 DIAGNOSIS — M25551 Pain in right hip: Secondary | ICD-10-CM | POA: Diagnosis not present

## 2021-02-04 DIAGNOSIS — M1612 Unilateral primary osteoarthritis, left hip: Secondary | ICD-10-CM | POA: Diagnosis not present

## 2021-02-04 DIAGNOSIS — Z96643 Presence of artificial hip joint, bilateral: Secondary | ICD-10-CM

## 2021-02-04 DIAGNOSIS — Z96642 Presence of left artificial hip joint: Secondary | ICD-10-CM | POA: Diagnosis not present

## 2021-02-04 DIAGNOSIS — M160 Bilateral primary osteoarthritis of hip: Secondary | ICD-10-CM | POA: Diagnosis not present

## 2021-02-04 DIAGNOSIS — G8929 Other chronic pain: Secondary | ICD-10-CM

## 2021-02-04 DIAGNOSIS — M545 Low back pain, unspecified: Secondary | ICD-10-CM

## 2021-02-04 DIAGNOSIS — Z96641 Presence of right artificial hip joint: Secondary | ICD-10-CM | POA: Insufficient documentation
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
## 2021-02-16 ENCOUNTER — Other Ambulatory Visit: Payer: Self-pay

## 2021-02-17 ENCOUNTER — Ambulatory Visit: Payer: Medicare PPO | Admitting: Adult Health

## 2021-02-17 ENCOUNTER — Encounter: Payer: Self-pay | Admitting: Adult Health

## 2021-02-17 VITALS — BP 110/82 | HR 86 | Temp 97.8°F | Ht 68.75 in | Wt 209.0 lb

## 2021-02-17 DIAGNOSIS — R4189 Other symptoms and signs involving cognitive functions and awareness: Secondary | ICD-10-CM

## 2021-02-17 LAB — VITAMIN B12: Vitamin B-12: 517 pg/mL (ref 211–911)
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
## 2021-02-18 ENCOUNTER — Encounter: Payer: Self-pay | Admitting: Adult Health

## 2021-02-18 NOTE — Telephone Encounter (Signed)
FYI

## 2021-02-19 ENCOUNTER — Other Ambulatory Visit: Payer: Self-pay

## 2021-02-19 ENCOUNTER — Ambulatory Visit
Admission: RE | Admit: 2021-02-19 | Discharge: 2021-02-19 | Disposition: A | Discharge status: Home / Self Care | Payer: Medicare PPO | Source: Ambulatory Visit | Attending: Orthopaedic Surgery | Admitting: Orthopaedic Surgery

## 2021-02-19 DIAGNOSIS — M545 Low back pain, unspecified: Secondary | ICD-10-CM

## 2021-02-21 ENCOUNTER — Other Ambulatory Visit: Payer: Medicare PPO

## 2021-03-03 ENCOUNTER — Ambulatory Visit: Payer: Medicare PPO | Admitting: Orthopaedic Surgery

## 2021-03-06 ENCOUNTER — Ambulatory Visit
Admission: RE | Admit: 2021-03-06 | Discharge: 2021-03-06 | Disposition: A | Discharge status: Home / Self Care | Payer: Medicare PPO | Source: Ambulatory Visit | Attending: Adult Health | Admitting: Adult Health

## 2021-03-06 ENCOUNTER — Other Ambulatory Visit: Payer: Self-pay

## 2021-03-06 DIAGNOSIS — I6782 Cerebral ischemia: Secondary | ICD-10-CM | POA: Diagnosis not present

## 2021-03-06 DIAGNOSIS — R4189 Other symptoms and signs involving cognitive functions and awareness: Secondary | ICD-10-CM

## 2021-03-06 DIAGNOSIS — J341 Cyst and mucocele of nose and nasal sinus: Secondary | ICD-10-CM | POA: Diagnosis not present

## 2021-03-06 DIAGNOSIS — R413 Other amnesia: Secondary | ICD-10-CM | POA: Diagnosis not present

## 2021-03-09 ENCOUNTER — Other Ambulatory Visit: Payer: Self-pay

## 2021-03-09 ENCOUNTER — Ambulatory Visit (INDEPENDENT_AMBULATORY_CARE_PROVIDER_SITE_OTHER): Payer: Medicare PPO | Admitting: Orthopaedic Surgery

## 2021-03-09 ENCOUNTER — Ambulatory Visit: Payer: Self-pay

## 2021-03-09 ENCOUNTER — Encounter: Payer: Self-pay | Admitting: Orthopaedic Surgery

## 2021-03-09 DIAGNOSIS — G8929 Other chronic pain: Secondary | ICD-10-CM | POA: Diagnosis not present

## 2021-03-09 DIAGNOSIS — M5441 Lumbago with sciatica, right side: Secondary | ICD-10-CM

## 2021-03-09 DIAGNOSIS — M5442 Lumbago with sciatica, left side: Secondary | ICD-10-CM | POA: Diagnosis not present

## 2021-03-09 DIAGNOSIS — M25521 Pain in right elbow: Secondary | ICD-10-CM | POA: Diagnosis not present

## 2021-03-09 DIAGNOSIS — M545 Low back pain, unspecified: Secondary | ICD-10-CM | POA: Diagnosis not present
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
## 2021-03-17 ENCOUNTER — Ambulatory Visit (INDEPENDENT_AMBULATORY_CARE_PROVIDER_SITE_OTHER): Payer: Medicare PPO | Admitting: Physical Medicine and Rehabilitation

## 2021-03-17 ENCOUNTER — Other Ambulatory Visit: Payer: Self-pay

## 2021-03-17 ENCOUNTER — Encounter: Payer: Self-pay | Admitting: Physical Medicine and Rehabilitation

## 2021-03-17 ENCOUNTER — Encounter: Payer: Self-pay | Admitting: Adult Health

## 2021-03-17 VITALS — BP 137/84 | HR 89

## 2021-03-17 DIAGNOSIS — M47816 Spondylosis without myelopathy or radiculopathy, lumbar region: Secondary | ICD-10-CM

## 2021-03-17 DIAGNOSIS — M5442 Lumbago with sciatica, left side: Secondary | ICD-10-CM | POA: Diagnosis not present

## 2021-03-17 DIAGNOSIS — G8929 Other chronic pain: Secondary | ICD-10-CM | POA: Diagnosis not present

## 2021-03-17 DIAGNOSIS — M5416 Radiculopathy, lumbar region: Secondary | ICD-10-CM | POA: Diagnosis not present

## 2021-03-17 DIAGNOSIS — M48062 Spinal stenosis, lumbar region with neurogenic claudication: Secondary | ICD-10-CM

## 2021-03-17 DIAGNOSIS — M5441 Lumbago with sciatica, right side: Secondary | ICD-10-CM | POA: Diagnosis not present

## 2021-03-17 MED ORDER — GLIPIZIDE ER 5 MG PO TB24: 5.0000 mg | ORAL_TABLET | Freq: Every day | ORAL | 0 refills | Status: DC
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
## 2021-03-17 NOTE — Progress Notes (Signed)
Pt state lower back pain that travels to his buttocks and both thighs and hips. Pt state walking, standing and rolling in the bed. Pt state he uses heating to help ease his pain.  Numeric Pain Rating Scale and Functional Assessment Average Pain 9 Pain Right Now 4 My pain is intermittent, sharp, dull, stabbing, and aching Pain is worse with: walking, bending, standing, and some activites Pain improves with: heat/ice   In the last MONTH (on 0-10 scale) has pain interfered with the following?  1. General activity like being  able to carry out your everyday physical activities such as walking, climbing stairs, carrying groceries, or moving a chair?  Rating(6)  2. Relation with others like being able to carry out your usual social activities and roles such as  activities at home, at work and in your community. Rating(7)  3. Enjoyment of life such that you have  been bothered by emotional problems such as feeling anxious, depressed or irritable?  Rating(8)

## 2021-03-25 ENCOUNTER — Encounter: Payer: Self-pay | Admitting: Physical Medicine and Rehabilitation

## 2021-03-25 ENCOUNTER — Ambulatory Visit: Payer: Self-pay

## 2021-03-25 ENCOUNTER — Ambulatory Visit (INDEPENDENT_AMBULATORY_CARE_PROVIDER_SITE_OTHER): Payer: Medicare PPO | Admitting: Physical Medicine and Rehabilitation

## 2021-03-25 ENCOUNTER — Other Ambulatory Visit: Payer: Self-pay

## 2021-03-25 VITALS — BP 137/85 | HR 86

## 2021-03-25 DIAGNOSIS — M5416 Radiculopathy, lumbar region: Secondary | ICD-10-CM

## 2021-03-25 MED ORDER — DEXAMETHASONE SODIUM PHOSPHATE 10 MG/ML IJ SOLN
15.0000 mg | Freq: Once | INTRAMUSCULAR | Status: AC
Start: 2021-03-25 — End: 2021-03-25
  Administered 2021-03-25: 15 mg

## 2021-03-25 NOTE — Patient Instructions (Signed)

## 2021-03-25 NOTE — Progress Notes (Signed)
Pt state lower back pain that travels to his buttocks and both thighs and hips. Pt state walking, standing and rolling in the bed. Pt state he uses heating to help ease his pain.  Numeric Pain Rating Scale and Functional Assessment Average Pain 3   In the last MONTH (on 0-10 scale) has pain interfered with the following?  1. General activity like being  able to carry out your everyday physical activities such as walking, climbing stairs, carrying groceries, or moving a chair?  Rating(9)   +Driver, +BT, -Dye Allergies.

## 2021-03-29 ENCOUNTER — Encounter: Payer: Self-pay | Admitting: Physical Therapy

## 2021-03-29 ENCOUNTER — Ambulatory Visit: Payer: Medicare PPO | Attending: Orthopaedic Surgery | Admitting: Physical Therapy

## 2021-03-29 ENCOUNTER — Other Ambulatory Visit: Payer: Self-pay

## 2021-03-29 DIAGNOSIS — M6281 Muscle weakness (generalized): Secondary | ICD-10-CM | POA: Insufficient documentation

## 2021-03-29 DIAGNOSIS — M5416 Radiculopathy, lumbar region: Secondary | ICD-10-CM | POA: Diagnosis not present

## 2021-03-29 DIAGNOSIS — R262 Difficulty in walking, not elsewhere classified: Secondary | ICD-10-CM | POA: Diagnosis not present

## 2021-03-29 DIAGNOSIS — M545 Low back pain, unspecified: Secondary | ICD-10-CM | POA: Insufficient documentation

## 2021-03-29 DIAGNOSIS — G8929 Other chronic pain: Secondary | ICD-10-CM | POA: Insufficient documentation

## 2021-03-29 DIAGNOSIS — R252 Cramp and spasm: Secondary | ICD-10-CM | POA: Diagnosis not present

## 2021-03-29 NOTE — Patient Instructions (Signed)
     Access Code: [email or web address] URL: [email or web address] Date: 03/29/2021 Prepared by: Glenetta Hew  Exercises Supine Quadriceps Stretch with Strap on Table - 2-3 x daily - 7 x weekly - 3 reps - 30 sec hold Hooklying Hamstring Stretch with Strap - 2-3 x daily - 7 x weekly - 3 reps - 30 sec hold Supine ITB Stretch with Strap - 2-3 x daily - 7 x weekly - 3 reps - 30 sec hold Supine Piriformis Stretch with Foot on Ground - 2-3 x daily - 7 x weekly - 3 reps - 30 sec hold Supine Lower Trunk Rotation - 2-3 x daily - 7 x weekly - 5 reps - 10 sec hold
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
## 2021-04-01 ENCOUNTER — Other Ambulatory Visit: Payer: Self-pay

## 2021-04-01 ENCOUNTER — Ambulatory Visit: Payer: Medicare PPO | Admitting: Physical Therapy

## 2021-04-01 ENCOUNTER — Encounter: Payer: Self-pay | Admitting: Physical Therapy

## 2021-04-01 DIAGNOSIS — G8929 Other chronic pain: Secondary | ICD-10-CM

## 2021-04-01 DIAGNOSIS — M6281 Muscle weakness (generalized): Secondary | ICD-10-CM | POA: Diagnosis not present

## 2021-04-01 DIAGNOSIS — R252 Cramp and spasm: Secondary | ICD-10-CM

## 2021-04-01 DIAGNOSIS — M5416 Radiculopathy, lumbar region: Secondary | ICD-10-CM | POA: Diagnosis not present

## 2021-04-01 DIAGNOSIS — M545 Low back pain, unspecified: Secondary | ICD-10-CM | POA: Diagnosis not present

## 2021-04-01 DIAGNOSIS — R262 Difficulty in walking, not elsewhere classified: Secondary | ICD-10-CM | POA: Diagnosis not present
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
## 2021-04-06 ENCOUNTER — Other Ambulatory Visit: Payer: Self-pay

## 2021-04-06 ENCOUNTER — Ambulatory Visit: Payer: Medicare PPO | Attending: Orthopaedic Surgery | Admitting: Physical Therapy

## 2021-04-06 ENCOUNTER — Encounter: Payer: Self-pay | Admitting: Physical Therapy

## 2021-04-06 DIAGNOSIS — M545 Low back pain, unspecified: Secondary | ICD-10-CM | POA: Diagnosis not present

## 2021-04-06 DIAGNOSIS — R252 Cramp and spasm: Secondary | ICD-10-CM | POA: Insufficient documentation

## 2021-04-06 DIAGNOSIS — G8929 Other chronic pain: Secondary | ICD-10-CM | POA: Diagnosis not present

## 2021-04-06 DIAGNOSIS — R262 Difficulty in walking, not elsewhere classified: Secondary | ICD-10-CM | POA: Diagnosis not present

## 2021-04-06 DIAGNOSIS — M6281 Muscle weakness (generalized): Secondary | ICD-10-CM | POA: Insufficient documentation

## 2021-04-06 DIAGNOSIS — M5416 Radiculopathy, lumbar region: Secondary | ICD-10-CM | POA: Diagnosis not present

## 2021-04-06 NOTE — Progress Notes (Signed)
[name] - 65 y.o. male MRN [id number]  Date of birth: [birth year]  Office Visit Note: Visit Date: 03/25/2021 PCP: Shirline Frees, NP Referred by: Shirline Frees, NP  Subjective: Chief Complaint  Patient presents with   Lower Back - Pain   Right Hip - Pain   Left Hip - Pain   Left Thigh - Pain   Right Thigh - Pain   HPI:  [name] is a 65 y.o. male who comes in today at the request of Ellin Goodie, FNP for planned Bilateral L2-3 Lumbar Transforaminal epidural steroid injection with fluoroscopic guidance.  The patient has failed conservative care including home exercise, medications, time and activity modification.  This injection will be diagnostic and hopefully therapeutic.  Please see requesting physician notes for further details and justification.   ROS Otherwise per HPI.  Assessment & Plan: Visit Diagnoses:    ICD-10-CM   1. Lumbar radiculopathy  M54.16 XR C-ARM NO REPORT    Epidural Steroid injection    dexamethasone (DECADRON) injection 15 mg      Plan: No additional findings.   Meds & Orders:  Meds ordered this encounter  Medications   dexamethasone (DECADRON) injection 15 mg    Orders Placed This Encounter  Procedures   XR C-ARM NO REPORT   Epidural Steroid injection    Follow-up: Return if symptoms worsen or fail to improve.   Procedures: No procedures performed  Lumbosacral Transforaminal Epidural Steroid Injection - Sub-Pedicular Approach with Fluoroscopic Guidance  Patient: [name]      Date of Birth: Feb 11, 1956 MRN: [id number] PCP: Shirline Frees, NP      Visit Date: 03/25/2021   Universal Protocol:    Date/Time: 03/25/2021  Consent Given By: the patient  Position: PRONE  Additional Comments: Vital signs were monitored before and after the procedure. Patient was prepped and draped in the usual sterile fashion. The correct patient, procedure, and site was verified.   Injection Procedure Details:    Procedure diagnoses: Lumbar radiculopathy [M54.16]    Meds Administered:  Meds ordered this encounter  Medications   dexamethasone (DECADRON) injection 15 mg    Laterality: Bilateral  Location/Site: L2  Needle:5.0 in., 22 ga.  Short bevel or Quincke spinal needle  Needle Placement: Transforaminal  Findings:    -Comments: Excellent flow of contrast along the nerve, nerve root and into the epidural space.  Procedure Details: After squaring off the end-plates to get a true AP view, the C-arm was positioned so that an oblique view of the foramen as noted above was visualized. The target area is just inferior to the "nose of the scotty dog" or sub pedicular. The soft tissues overlying this structure were infiltrated with 2-3 ml. of 1% Lidocaine without Epinephrine.  The spinal needle was inserted toward the target using a "trajectory" view along the fluoroscope beam.  Under AP and lateral visualization, the needle was advanced so it did not puncture dura and was located close the 6 O'Clock position of the pedical in AP tracterory. Biplanar projections were used to confirm position. Aspiration was confirmed to be negative for CSF and/or blood. A 1-2 ml. volume of Isovue-250 was injected and flow of contrast was noted at each level. Radiographs were obtained for documentation purposes.   After attaining the desired flow of contrast documented above, a 0.5 to 1.0 ml test dose of 0.25% Marcaine was injected into each respective transforaminal space.  The patient was observed for 90 seconds post injection.  After  no sensory deficits were reported, and normal lower extremity motor function was noted,   the above injectate was administered so that equal amounts of the injectate were placed at each foramen (level) into the transforaminal epidural space.   Additional Comments:  The patient tolerated the procedure well Dressing: 2 x 2 sterile gauze and Band-Aid    Post-procedure  details: Patient was observed during the procedure. Post-procedure instructions were reviewed.  Patient left the clinic in stable condition.     Clinical History: MRI LUMBAR SPINE WITHOUT CONTRAST   TECHNIQUE: Multiplanar, multisequence MR imaging of the lumbar spine was performed. No intravenous contrast was administered.   COMPARISON:  CT lumbar spine 12/20/2019   FINDINGS: Segmentation: 5 non rib-bearing lumbar type vertebral bodies are present. The lowest fully formed vertebral body is L5.   Alignment:  Slight retrolisthesis L2-3 and L4-5 is stable.   Vertebrae: Schmorl's nodes present T11-12 T12-L1 L1-2 and L2-3. Marrow signal and vertebral body heights are otherwise normal.   Conus medullaris and cauda equina: Conus extends to the L1 level. Conus and cauda equina appear normal.   Paraspinal and other soft tissues: Limited imaging the abdomen is unremarkable. There is no significant adenopathy. No solid organ lesions are present.   Disc levels:   T12-L1: Negative.   L1-2: Mild broad-based disc protrusion is present. Consult for short pedicles and mild facet hypertrophy there is mild foraminal narrowing, right greater than left.   L2-3: A broad-based disc protrusion is present. Moderate to severe central canal stenosis is present. Moderate foraminal narrowing is noted bilaterally. Short pedicles contribute.   L3-4: A broad-based disc protrusion is asymmetric the right. Moderate central canal stenosis is present. Severe right subarticular narrowing noted. Moderate foraminal stenosis is worse left than right   L4-5: A broad-based disc protrusion is present. Moderate facet hypertrophy noted bilaterally. Short pedicles and facet hypertrophy contribute to moderate foraminal narrowing bilaterally. Moderate subarticular narrowing present bilaterally.   L5-S1: Mild disc bulging is present. Moderate facet hypertrophy noted bilaterally. The central canal is patent.  Moderate right and mild left foraminal stenosis is present.   IMPRESSION: 1. Multilevel spondylosis of the lumbar spine secondary to multilevel disc disease, facet hypertrophy and congenitally short pedicles. 2. Moderate to severe central canal stenosis at L2-3 with moderate foraminal narrowing bilaterally. 3. Moderate central canal stenosis with severe right subarticular narrowing at L3-4. 4. Moderate foraminal narrowing bilaterally at L3-4 is worse left than right. 5. Moderate foraminal narrowing bilaterally at L4-5 with moderate subarticular narrowing bilaterally. 6. Moderate right and mild left foraminal stenosis at L5-S1.     Electronically Signed   By: Marin Roberts M.D.   On: 02/19/2021 16:25     Objective:  VS:  HT:    WT:   BMI:     BP:137/85  HR:86bpm  TEMP: ( )  RESP:  Physical Exam Vitals and nursing note reviewed.  Constitutional:      General: He is not in acute distress.    Appearance: Normal appearance. He is not ill-appearing.  HENT:     Head: Normocephalic and atraumatic.     Right Ear: External ear normal.     Left Ear: External ear normal.     Nose: No congestion.  Eyes:     Extraocular Movements: Extraocular movements intact.  Cardiovascular:     Rate and Rhythm: Normal rate.     Pulses: Normal pulses.  Pulmonary:     Effort: Pulmonary effort is normal. No respiratory distress.  Abdominal:     General: There is no distension.     Palpations: Abdomen is soft.  Musculoskeletal:        General: No tenderness or signs of injury.     Cervical back: Neck supple.     Right lower leg: No edema.     Left lower leg: No edema.     Comments: Patient has good distal strength without clonus.  Skin:    Findings: No erythema or rash.  Neurological:     General: No focal deficit present.     Mental Status: He is alert and oriented to person, place, and time.     Sensory: No sensory deficit.     Motor: No weakness or abnormal muscle tone.      Coordination: Coordination normal.  Psychiatric:        Mood and Affect: Mood normal.        Behavior: Behavior normal.     Imaging: No results found.

## 2021-04-06 NOTE — Procedures (Signed)
Lumbosacral Transforaminal Epidural Steroid Injection - Sub-Pedicular Approach with Fluoroscopic Guidance  Patient: [name]      Date of Birth: 05-03-1956 MRN: [id number] PCP: Shirline Frees, NP      Visit Date: 03/25/2021   Universal Protocol:    Date/Time: 03/25/2021  Consent Given By: the patient  Position: PRONE  Additional Comments: Vital signs were monitored before and after the procedure. Patient was prepped and draped in the usual sterile fashion. The correct patient, procedure, and site was verified.   Injection Procedure Details:   Procedure diagnoses: Lumbar radiculopathy [M54.16]    Meds Administered:  Meds ordered this encounter  Medications   dexamethasone (DECADRON) injection 15 mg    Laterality: Bilateral  Location/Site: L2  Needle:5.0 in., 22 ga.  Short bevel or Quincke spinal needle  Needle Placement: Transforaminal  Findings:    -Comments: Excellent flow of contrast along the nerve, nerve root and into the epidural space.  Procedure Details: After squaring off the end-plates to get a true AP view, the C-arm was positioned so that an oblique view of the foramen as noted above was visualized. The target area is just inferior to the "nose of the scotty dog" or sub pedicular. The soft tissues overlying this structure were infiltrated with 2-3 ml. of 1% Lidocaine without Epinephrine.  The spinal needle was inserted toward the target using a "trajectory" view along the fluoroscope beam.  Under AP and lateral visualization, the needle was advanced so it did not puncture dura and was located close the 6 O'Clock position of the pedical in AP tracterory. Biplanar projections were used to confirm position. Aspiration was confirmed to be negative for CSF and/or blood. A 1-2 ml. volume of Isovue-250 was injected and flow of contrast was noted at each level. Radiographs were obtained for documentation purposes.   After attaining the desired flow of  contrast documented above, a 0.5 to 1.0 ml test dose of 0.25% Marcaine was injected into each respective transforaminal space.  The patient was observed for 90 seconds post injection.  After no sensory deficits were reported, and normal lower extremity motor function was noted,   the above injectate was administered so that equal amounts of the injectate were placed at each foramen (level) into the transforaminal epidural space.   Additional Comments:  The patient tolerated the procedure well Dressing: 2 x 2 sterile gauze and Band-Aid    Post-procedure details: Patient was observed during the procedure. Post-procedure instructions were reviewed.  Patient left the clinic in stable condition.
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
## 2021-04-06 NOTE — Patient Instructions (Signed)
  Access Code: [email or web address] URL: [email or web address] Date: 04/06/2021 Prepared by: Glenetta Hew  Exercises Supine Quadriceps Stretch with Strap on Table - 2-3 x daily - 7 x weekly - 3 reps - 30 sec hold Hooklying Hamstring Stretch with Strap - 2-3 x daily - 7 x weekly - 3 reps - 30 sec hold Supine ITB Stretch with Strap - 2-3 x daily - 7 x weekly - 3 reps - 30 sec hold Supine Piriformis Stretch with Foot on Ground - 2-3 x daily - 7 x weekly - 3 reps - 30 sec hold Supine Lower Trunk Rotation - 2-3 x daily - 7 x weekly - 5 reps - 10 sec hold Supine Hip Adduction Isometric with Ball - 2 x daily - 7 x weekly - 2 sets - 10 reps - 5 sec hold Hooklying Single Leg Bent Knee Fallouts with Resistance - 1 x daily - 7 x weekly - 2 sets - 10 reps - 3 sec hold Supine March with Resistance Band - 1 x daily - 7 x weekly - 2 sets - 10 reps - 3 sec hold hold Supine Bridge with Resistance Band - 1 x daily - 7 x weekly - 2 sets - 10 reps - 5 sec hold

## 2021-04-07 ENCOUNTER — Ambulatory Visit: Payer: Medicare PPO | Admitting: Adult Health

## 2021-04-07 ENCOUNTER — Other Ambulatory Visit: Payer: Self-pay | Admitting: Adult Health

## 2021-04-07 ENCOUNTER — Encounter: Payer: Self-pay | Admitting: Adult Health

## 2021-04-07 VITALS — BP 100/70 | HR 83 | Temp 98.0°F | Ht 68.75 in | Wt 208.0 lb

## 2021-04-07 DIAGNOSIS — I10 Essential (primary) hypertension: Secondary | ICD-10-CM

## 2021-04-07 DIAGNOSIS — E119 Type 2 diabetes mellitus without complications: Secondary | ICD-10-CM

## 2021-04-07 LAB — POCT GLYCOSYLATED HEMOGLOBIN (HGB A1C): Hemoglobin A1C: 6.7 % — AB (ref 4.0–5.6)
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
## 2021-04-09 ENCOUNTER — Encounter: Payer: Self-pay | Admitting: Physical Medicine and Rehabilitation

## 2021-04-09 DIAGNOSIS — M5442 Lumbago with sciatica, left side: Secondary | ICD-10-CM

## 2021-04-09 DIAGNOSIS — M5416 Radiculopathy, lumbar region: Secondary | ICD-10-CM

## 2021-04-09 DIAGNOSIS — G8929 Other chronic pain: Secondary | ICD-10-CM

## 2021-04-09 DIAGNOSIS — M48062 Spinal stenosis, lumbar region with neurogenic claudication: Secondary | ICD-10-CM

## 2021-04-12 ENCOUNTER — Ambulatory Visit: Payer: Medicare PPO | Admitting: Physical Therapy

## 2021-04-13 ENCOUNTER — Telehealth: Payer: Self-pay | Admitting: Adult Health

## 2021-04-13 ENCOUNTER — Other Ambulatory Visit: Payer: Self-pay

## 2021-04-13 MED ORDER — GLIPIZIDE ER 5 MG PO TB24: 5.0000 mg | ORAL_TABLET | Freq: Every day | ORAL | 0 refills | Status: DC

## 2021-04-13 NOTE — Telephone Encounter (Signed)
Spoke to representative from [employer] who stated that the pt Glipizide Rx had expired. New Rx sent to pharmacy electronically.

## 2021-04-13 NOTE — Telephone Encounter (Signed)
Taniya from [facility] called to check on the status of a request they sent over via fax on November 1st. While waiting for CMA to finish up with a patient, Taniya hung up before receiving any information on any requests for the patient.

## 2021-04-14 ENCOUNTER — Ambulatory Visit: Payer: Medicare PPO

## 2021-04-14 ENCOUNTER — Other Ambulatory Visit: Payer: Self-pay

## 2021-04-14 DIAGNOSIS — M5416 Radiculopathy, lumbar region: Secondary | ICD-10-CM

## 2021-04-14 DIAGNOSIS — G8929 Other chronic pain: Secondary | ICD-10-CM | POA: Diagnosis not present

## 2021-04-14 DIAGNOSIS — M545 Low back pain, unspecified: Secondary | ICD-10-CM

## 2021-04-14 DIAGNOSIS — M6281 Muscle weakness (generalized): Secondary | ICD-10-CM | POA: Diagnosis not present

## 2021-04-14 DIAGNOSIS — R262 Difficulty in walking, not elsewhere classified: Secondary | ICD-10-CM

## 2021-04-14 DIAGNOSIS — R252 Cramp and spasm: Secondary | ICD-10-CM
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
## 2021-04-19 ENCOUNTER — Other Ambulatory Visit: Payer: Self-pay

## 2021-04-19 ENCOUNTER — Ambulatory Visit: Payer: Medicare PPO

## 2021-04-19 DIAGNOSIS — M545 Low back pain, unspecified: Secondary | ICD-10-CM | POA: Diagnosis not present

## 2021-04-19 DIAGNOSIS — M6281 Muscle weakness (generalized): Secondary | ICD-10-CM | POA: Diagnosis not present

## 2021-04-19 DIAGNOSIS — M5416 Radiculopathy, lumbar region: Secondary | ICD-10-CM | POA: Diagnosis not present

## 2021-04-19 DIAGNOSIS — R262 Difficulty in walking, not elsewhere classified: Secondary | ICD-10-CM

## 2021-04-19 DIAGNOSIS — G8929 Other chronic pain: Secondary | ICD-10-CM | POA: Diagnosis not present

## 2021-04-19 DIAGNOSIS — R252 Cramp and spasm: Secondary | ICD-10-CM | POA: Diagnosis not present
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
## 2021-04-21 ENCOUNTER — Other Ambulatory Visit: Payer: Self-pay

## 2021-04-21 ENCOUNTER — Ambulatory Visit: Payer: Medicare PPO

## 2021-04-21 DIAGNOSIS — M545 Low back pain, unspecified: Secondary | ICD-10-CM | POA: Diagnosis not present

## 2021-04-21 DIAGNOSIS — R252 Cramp and spasm: Secondary | ICD-10-CM

## 2021-04-21 DIAGNOSIS — M5416 Radiculopathy, lumbar region: Secondary | ICD-10-CM | POA: Diagnosis not present

## 2021-04-21 DIAGNOSIS — R262 Difficulty in walking, not elsewhere classified: Secondary | ICD-10-CM | POA: Diagnosis not present

## 2021-04-21 DIAGNOSIS — I480 Paroxysmal atrial fibrillation: Secondary | ICD-10-CM | POA: Diagnosis not present

## 2021-04-21 DIAGNOSIS — G8929 Other chronic pain: Secondary | ICD-10-CM

## 2021-04-21 DIAGNOSIS — M6281 Muscle weakness (generalized): Secondary | ICD-10-CM
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
## 2021-04-26 DIAGNOSIS — M48062 Spinal stenosis, lumbar region with neurogenic claudication: Secondary | ICD-10-CM | POA: Diagnosis not present

## 2021-04-26 DIAGNOSIS — I471 Supraventricular tachycardia: Secondary | ICD-10-CM | POA: Diagnosis not present

## 2021-04-28 ENCOUNTER — Ambulatory Visit: Payer: Medicare PPO | Admitting: Physical Therapy

## 2021-05-04 ENCOUNTER — Ambulatory Visit: Payer: Medicare PPO

## 2021-05-04 ENCOUNTER — Other Ambulatory Visit: Payer: Self-pay

## 2021-05-04 DIAGNOSIS — M545 Low back pain, unspecified: Secondary | ICD-10-CM | POA: Diagnosis not present

## 2021-05-04 DIAGNOSIS — G8929 Other chronic pain: Secondary | ICD-10-CM

## 2021-05-04 DIAGNOSIS — R252 Cramp and spasm: Secondary | ICD-10-CM | POA: Diagnosis not present

## 2021-05-04 DIAGNOSIS — R262 Difficulty in walking, not elsewhere classified: Secondary | ICD-10-CM

## 2021-05-04 DIAGNOSIS — M5416 Radiculopathy, lumbar region: Secondary | ICD-10-CM

## 2021-05-04 DIAGNOSIS — M6281 Muscle weakness (generalized): Secondary | ICD-10-CM | POA: Diagnosis not present

## 2021-05-04 NOTE — Patient Instructions (Signed)
Access Code: [email or web address] URL: [email or web address] Date: 05/04/2021 Prepared by: Verta Ellen  Exercises Seated Flexion Stretch with Swiss Ball - 1 x daily - 7 x weekly - 3 sets - 10 reps Seated Thoracic Flexion and Rotation with Swiss Ball - 1 x daily - 7 x weekly - 3 sets - 10 reps
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
## 2021-05-06 ENCOUNTER — Other Ambulatory Visit: Payer: Self-pay

## 2021-05-06 ENCOUNTER — Ambulatory Visit: Payer: Medicare PPO | Attending: Orthopaedic Surgery | Admitting: Physical Therapy

## 2021-05-06 ENCOUNTER — Encounter: Payer: Self-pay | Admitting: Physical Therapy

## 2021-05-06 DIAGNOSIS — M6281 Muscle weakness (generalized): Secondary | ICD-10-CM | POA: Insufficient documentation

## 2021-05-06 DIAGNOSIS — M545 Low back pain, unspecified: Secondary | ICD-10-CM | POA: Diagnosis not present

## 2021-05-06 DIAGNOSIS — R262 Difficulty in walking, not elsewhere classified: Secondary | ICD-10-CM | POA: Diagnosis not present

## 2021-05-06 DIAGNOSIS — G8929 Other chronic pain: Secondary | ICD-10-CM | POA: Diagnosis not present

## 2021-05-06 DIAGNOSIS — M5416 Radiculopathy, lumbar region: Secondary | ICD-10-CM | POA: Insufficient documentation

## 2021-05-06 DIAGNOSIS — R252 Cramp and spasm: Secondary | ICD-10-CM | POA: Insufficient documentation
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
## 2021-05-20 LAB — HM DIABETES EYE EXAM

## 2021-05-22 ENCOUNTER — Other Ambulatory Visit: Payer: Self-pay | Admitting: Adult Health

## 2021-06-10 DIAGNOSIS — J849 Interstitial pulmonary disease, unspecified: Secondary | ICD-10-CM | POA: Diagnosis not present

## 2021-06-10 DIAGNOSIS — Z87891 Personal history of nicotine dependence: Secondary | ICD-10-CM | POA: Diagnosis not present

## 2021-06-10 DIAGNOSIS — J431 Panlobular emphysema: Secondary | ICD-10-CM | POA: Diagnosis not present

## 2021-06-10 DIAGNOSIS — I480 Paroxysmal atrial fibrillation: Secondary | ICD-10-CM | POA: Diagnosis not present

## 2021-06-10 DIAGNOSIS — G4733 Obstructive sleep apnea (adult) (pediatric): Secondary | ICD-10-CM | POA: Diagnosis not present

## 2021-06-15 ENCOUNTER — Encounter: Payer: Self-pay | Admitting: Adult Health

## 2021-06-15 DIAGNOSIS — Z0279 Encounter for issue of other medical certificate: Secondary | ICD-10-CM

## 2021-06-19 ENCOUNTER — Other Ambulatory Visit: Payer: Self-pay | Admitting: Adult Health

## 2021-06-19 DIAGNOSIS — E119 Type 2 diabetes mellitus without complications: Secondary | ICD-10-CM

## 2021-06-24 ENCOUNTER — Encounter: Payer: Self-pay | Admitting: Physical Medicine and Rehabilitation

## 2021-06-24 ENCOUNTER — Ambulatory Visit: Payer: Medicare PPO | Admitting: Physical Medicine and Rehabilitation

## 2021-06-24 ENCOUNTER — Other Ambulatory Visit: Payer: Self-pay

## 2021-06-24 VITALS — BP 124/71 | HR 75

## 2021-06-24 DIAGNOSIS — M5442 Lumbago with sciatica, left side: Secondary | ICD-10-CM | POA: Diagnosis not present

## 2021-06-24 DIAGNOSIS — M47816 Spondylosis without myelopathy or radiculopathy, lumbar region: Secondary | ICD-10-CM

## 2021-06-24 DIAGNOSIS — M5441 Lumbago with sciatica, right side: Secondary | ICD-10-CM | POA: Diagnosis not present

## 2021-06-24 DIAGNOSIS — M48062 Spinal stenosis, lumbar region with neurogenic claudication: Secondary | ICD-10-CM | POA: Diagnosis not present

## 2021-06-24 DIAGNOSIS — M5416 Radiculopathy, lumbar region: Secondary | ICD-10-CM | POA: Diagnosis not present

## 2021-06-24 DIAGNOSIS — Z87898 Personal history of other specified conditions: Secondary | ICD-10-CM

## 2021-06-24 DIAGNOSIS — G8929 Other chronic pain: Secondary | ICD-10-CM | POA: Diagnosis not present
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
## 2021-06-24 NOTE — Progress Notes (Signed)
Pt state he only had an episode a few years ago.

## 2021-07-08 ENCOUNTER — Ambulatory Visit: Payer: Medicare PPO | Admitting: Adult Health

## 2021-07-18 ENCOUNTER — Other Ambulatory Visit: Payer: Self-pay | Admitting: Adult Health

## 2021-07-22 ENCOUNTER — Encounter: Payer: Self-pay | Admitting: Adult Health

## 2021-07-22 ENCOUNTER — Ambulatory Visit: Payer: Medicare PPO | Admitting: Adult Health

## 2021-07-22 VITALS — BP 100/72 | HR 103 | Temp 98.0°F | Ht 68.75 in | Wt 212.0 lb

## 2021-07-22 DIAGNOSIS — E1165 Type 2 diabetes mellitus with hyperglycemia: Secondary | ICD-10-CM | POA: Diagnosis not present

## 2021-07-22 DIAGNOSIS — I10 Essential (primary) hypertension: Secondary | ICD-10-CM | POA: Diagnosis not present

## 2021-07-22 LAB — POCT GLYCOSYLATED HEMOGLOBIN (HGB A1C): Hemoglobin A1C: 7.4 % — AB (ref 4.0–5.6)
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
## 2021-07-29 DIAGNOSIS — M5116 Intervertebral disc disorders with radiculopathy, lumbar region: Secondary | ICD-10-CM | POA: Diagnosis not present

## 2021-07-29 DIAGNOSIS — M5416 Radiculopathy, lumbar region: Secondary | ICD-10-CM | POA: Diagnosis not present

## 2021-08-02 ENCOUNTER — Ambulatory Visit (INDEPENDENT_AMBULATORY_CARE_PROVIDER_SITE_OTHER): Payer: Medicare PPO

## 2021-08-02 VITALS — Ht 68.0 in | Wt 205.0 lb

## 2021-08-02 DIAGNOSIS — Z0000 Encounter for general adult medical examination without abnormal findings: Secondary | ICD-10-CM | POA: Diagnosis not present

## 2021-08-02 NOTE — Patient Instructions (Signed)
[name] , Thank you for taking time to come for your Medicare Wellness Visit. I appreciate your ongoing commitment to your health goals. Please review the following plan we discussed and let me know if I can assist you in the future.   Screening recommendations/referrals: Colonoscopy: completed 06/06/2016, due 06/06/2026 Recommended yearly ophthalmology/optometry visit for glaucoma screening and checkup Recommended yearly dental visit for hygiene and checkup  Vaccinations: Influenza vaccine: completed 03/13/2021, due next flu season Pneumococcal vaccine: completed 03/27/2020 Tdap vaccine: completed 08/19/2013, due 08/20/2023 Shingles vaccine: completed   Covid-19:  03/06/2021, 12/05/2020, 04/12/2020, 08/18/2019  Advanced directives: copy in chart  Conditions/risks identified: none  Next appointment: Follow up in one year for your annual wellness visit.   Preventive Care 64 Years and Older, Male Preventive care refers to lifestyle choices and visits with your health care provider that can promote health and wellness. What does preventive care include? A yearly physical exam. This is also called an annual well check. Dental exams once or twice a year. Routine eye exams. Ask your health care provider how often you should have your eyes checked. Personal lifestyle choices, including: Daily care of your teeth and gums. Regular physical activity. Eating a healthy diet. Avoiding tobacco and drug use. Limiting alcohol use. Practicing safe sex. Taking low doses of aspirin every day. Taking vitamin and mineral supplements as recommended by your health care provider. What happens during an annual well check? The services and screenings done by your health care provider during your annual well check will depend on your age, overall health, lifestyle risk factors, and family history of disease. Counseling  Your health care provider may ask you questions about your: Alcohol use. Tobacco use. Drug  use. Emotional well-being. Home and relationship well-being. Sexual activity. Eating habits. History of falls. Memory and ability to understand (cognition). Work and work Astronomer. Screening  You may have the following tests or measurements: Height, weight, and BMI. Blood pressure. Lipid and cholesterol levels. These may be checked every 5 years, or more frequently if you are over 68 years old. Skin check. Lung cancer screening. You may have this screening every year starting at age 24 if you have a 30-pack-year history of smoking and currently smoke or have quit within the past 15 years. Fecal occult blood test (FOBT) of the stool. You may have this test every year starting at age 43. Flexible sigmoidoscopy or colonoscopy. You may have a sigmoidoscopy every 5 years or a colonoscopy every 10 years starting at age 33. Prostate cancer screening. Recommendations will vary depending on your family history and other risks. Hepatitis C blood test. Hepatitis B blood test. Sexually transmitted disease (STD) testing. Diabetes screening. This is done by checking your blood sugar (glucose) after you have not eaten for a while (fasting). You may have this done every 1-3 years. Abdominal aortic aneurysm (AAA) screening. You may need this if you are a current or former smoker. Osteoporosis. You may be screened starting at age 63 if you are at high risk. Talk with your health care provider about your test results, treatment options, and if necessary, the need for more tests. Vaccines  Your health care provider may recommend certain vaccines, such as: Influenza vaccine. This is recommended every year. Tetanus, diphtheria, and acellular pertussis (Tdap, Td) vaccine. You may need a Td booster every 10 years. Zoster vaccine. You may need this after age 68. Pneumococcal 13-valent conjugate (PCV13) vaccine. One dose is recommended after age 66. Pneumococcal polysaccharide (PPSV23) vaccine. One  dose is  recommended after age 62. Talk to your health care provider about which screenings and vaccines you need and how often you need them. This information is not intended to replace advice given to you by your health care provider. Make sure you discuss any questions you have with your health care provider. Document Released: 06/19/2015 Document Revised: 02/10/2016 Document Reviewed: 03/24/2015 Elsevier Interactive Patient Education  2017 [employer] Prevention in the Home Falls can cause injuries. They can happen to people of all ages. There are many things you can do to make your home safe and to help prevent falls. What can I do on the outside of my home? Regularly fix the edges of walkways and driveways and fix any cracks. Remove anything that might make you trip as you walk through a door, such as a raised step or threshold. Trim any bushes or trees on the path to your home. Use bright outdoor lighting. Clear any walking paths of anything that might make someone trip, such as rocks or tools. Regularly check to see if handrails are loose or broken. Make sure that both sides of any steps have handrails. Any raised decks and porches should have guardrails on the edges. Have any leaves, snow, or ice cleared regularly. Use sand or salt on walking paths during winter. Clean up any spills in your garage right away. This includes oil or grease spills. What can I do in the bathroom? Use night lights. Install grab bars by the toilet and in the tub and shower. Do not use towel bars as grab bars. Use non-skid mats or decals in the tub or shower. If you need to sit down in the shower, use a plastic, non-slip stool. Keep the floor dry. Clean up any water that spills on the floor as soon as it happens. Remove soap buildup in the tub or shower regularly. Attach bath mats securely with double-sided non-slip rug tape. Do not have throw rugs and other things on the floor that can make you  trip. What can I do in the bedroom? Use night lights. Make sure that you have a light by your bed that is easy to reach. Do not use any sheets or blankets that are too big for your bed. They should not hang down onto the floor. Have a firm chair that has side arms. You can use this for support while you get dressed. Do not have throw rugs and other things on the floor that can make you trip. What can I do in the kitchen? Clean up any spills right away. Avoid walking on wet floors. Keep items that you use a lot in easy-to-reach places. If you need to reach something above you, use a strong step stool that has a grab bar. Keep electrical cords out of the way. Do not use floor polish or wax that makes floors slippery. If you must use wax, use non-skid floor wax. Do not have throw rugs and other things on the floor that can make you trip. What can I do with my stairs? Do not leave any items on the stairs. Make sure that there are handrails on both sides of the stairs and use them. Fix handrails that are broken or loose. Make sure that handrails are as long as the stairways. Check any carpeting to make sure that it is firmly attached to the stairs. Fix any carpet that is loose or worn. Avoid having throw rugs at the top or bottom of the  stairs. If you do have throw rugs, attach them to the floor with carpet tape. Make sure that you have a light switch at the top of the stairs and the bottom of the stairs. If you do not have them, ask someone to add them for you. What else can I do to help prevent falls? Wear shoes that: Do not have high heels. Have rubber bottoms. Are comfortable and fit you well. Are closed at the toe. Do not wear sandals. If you use a stepladder: Make sure that it is fully opened. Do not climb a closed stepladder. Make sure that both sides of the stepladder are locked into place. Ask someone to hold it for you, if possible. Clearly Solomon and make sure that you can  see: Any grab bars or handrails. First and last steps. Where the edge of each step is. Use tools that help you move around (mobility aids) if they are needed. These include: Canes. Walkers. Scooters. Crutches. Turn on the lights when you go into a dark area. Replace any light bulbs as soon as they burn out. Set up your furniture so you have a clear path. Avoid moving your furniture around. If any of your floors are uneven, fix them. If there are any pets around you, be aware of where they are. Review your medicines with your doctor. Some medicines can make you feel dizzy. This can increase your chance of falling. Ask your doctor what other things that you can do to help prevent falls. This information is not intended to replace advice given to you by your health care provider. Make sure you discuss any questions you have with your health care provider. Document Released: 03/19/2009 Document Revised: 10/29/2015 Document Reviewed: 06/27/2014 Elsevier Interactive Patient Education  2017 [employer].
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
## 2021-08-26 DIAGNOSIS — R58 Hemorrhage, not elsewhere classified: Secondary | ICD-10-CM | POA: Diagnosis not present

## 2021-08-26 DIAGNOSIS — S0101XA Laceration without foreign body of scalp, initial encounter: Secondary | ICD-10-CM | POA: Diagnosis not present

## 2021-08-26 DIAGNOSIS — S199XXA Unspecified injury of neck, initial encounter: Secondary | ICD-10-CM | POA: Diagnosis not present

## 2021-08-26 DIAGNOSIS — T1490XA Injury, unspecified, initial encounter: Secondary | ICD-10-CM | POA: Diagnosis not present

## 2021-08-26 DIAGNOSIS — Z7901 Long term (current) use of anticoagulants: Secondary | ICD-10-CM | POA: Diagnosis not present

## 2021-08-26 DIAGNOSIS — F17210 Nicotine dependence, cigarettes, uncomplicated: Secondary | ICD-10-CM | POA: Diagnosis not present

## 2021-08-26 DIAGNOSIS — J449 Chronic obstructive pulmonary disease, unspecified: Secondary | ICD-10-CM | POA: Diagnosis not present

## 2021-08-26 DIAGNOSIS — I4891 Unspecified atrial fibrillation: Secondary | ICD-10-CM | POA: Diagnosis not present

## 2021-08-26 DIAGNOSIS — Z79899 Other long term (current) drug therapy: Secondary | ICD-10-CM | POA: Diagnosis not present

## 2021-08-26 DIAGNOSIS — S0990XA Unspecified injury of head, initial encounter: Secondary | ICD-10-CM | POA: Diagnosis not present

## 2021-08-26 DIAGNOSIS — Z7984 Long term (current) use of oral hypoglycemic drugs: Secondary | ICD-10-CM | POA: Diagnosis not present

## 2021-08-26 DIAGNOSIS — W19XXXA Unspecified fall, initial encounter: Secondary | ICD-10-CM | POA: Diagnosis not present

## 2021-08-31 DIAGNOSIS — G4733 Obstructive sleep apnea (adult) (pediatric): Secondary | ICD-10-CM | POA: Diagnosis not present

## 2021-08-31 DIAGNOSIS — J849 Interstitial pulmonary disease, unspecified: Secondary | ICD-10-CM | POA: Diagnosis not present

## 2021-08-31 DIAGNOSIS — R0602 Shortness of breath: Secondary | ICD-10-CM | POA: Diagnosis not present

## 2021-08-31 DIAGNOSIS — R059 Cough, unspecified: Secondary | ICD-10-CM | POA: Diagnosis not present

## 2021-08-31 DIAGNOSIS — J449 Chronic obstructive pulmonary disease, unspecified: Secondary | ICD-10-CM | POA: Diagnosis not present

## 2021-08-31 DIAGNOSIS — I480 Paroxysmal atrial fibrillation: Secondary | ICD-10-CM | POA: Diagnosis not present

## 2021-09-02 ENCOUNTER — Encounter: Payer: Self-pay | Admitting: Adult Health

## 2021-09-02 ENCOUNTER — Ambulatory Visit: Payer: Medicare PPO | Admitting: Adult Health

## 2021-09-02 VITALS — BP 110/68 | HR 94 | Temp 97.5°F | Wt 196.0 lb

## 2021-09-02 DIAGNOSIS — Z4802 Encounter for removal of sutures: Secondary | ICD-10-CM | POA: Diagnosis not present

## 2021-09-02 MED ORDER — ROSUVASTATIN CALCIUM 10 MG PO TABS: 10.0000 mg | ORAL_TABLET | Freq: Every day | ORAL | 1 refills | Status: DC
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
## 2021-09-07 ENCOUNTER — Ambulatory Visit (INDEPENDENT_AMBULATORY_CARE_PROVIDER_SITE_OTHER): Payer: Medicare PPO | Admitting: Psychology

## 2021-09-07 DIAGNOSIS — F4323 Adjustment disorder with mixed anxiety and depressed mood: Secondary | ICD-10-CM
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
## 2021-09-14 ENCOUNTER — Ambulatory Visit: Payer: Medicare PPO | Admitting: Psychology

## 2021-09-15 DIAGNOSIS — I4729 Other ventricular tachycardia: Secondary | ICD-10-CM | POA: Diagnosis not present

## 2021-09-15 DIAGNOSIS — I471 Supraventricular tachycardia: Secondary | ICD-10-CM | POA: Diagnosis not present

## 2021-09-22 DIAGNOSIS — J439 Emphysema, unspecified: Secondary | ICD-10-CM | POA: Diagnosis not present

## 2021-09-22 DIAGNOSIS — Z87891 Personal history of nicotine dependence: Secondary | ICD-10-CM | POA: Diagnosis not present

## 2021-09-22 DIAGNOSIS — J849 Interstitial pulmonary disease, unspecified: Secondary | ICD-10-CM | POA: Diagnosis not present

## 2021-09-22 DIAGNOSIS — R911 Solitary pulmonary nodule: Secondary | ICD-10-CM | POA: Diagnosis not present

## 2021-09-22 DIAGNOSIS — R0600 Dyspnea, unspecified: Secondary | ICD-10-CM | POA: Diagnosis not present

## 2021-09-22 DIAGNOSIS — I700 Atherosclerosis of aorta: Secondary | ICD-10-CM | POA: Diagnosis not present

## 2021-09-27 DIAGNOSIS — I480 Paroxysmal atrial fibrillation: Secondary | ICD-10-CM | POA: Diagnosis not present

## 2021-09-29 DIAGNOSIS — G4733 Obstructive sleep apnea (adult) (pediatric): Secondary | ICD-10-CM | POA: Diagnosis not present

## 2021-09-29 DIAGNOSIS — I480 Paroxysmal atrial fibrillation: Secondary | ICD-10-CM | POA: Diagnosis not present

## 2021-09-29 DIAGNOSIS — H6982 Other specified disorders of Eustachian tube, left ear: Secondary | ICD-10-CM | POA: Diagnosis not present

## 2021-09-29 DIAGNOSIS — J849 Interstitial pulmonary disease, unspecified: Secondary | ICD-10-CM | POA: Diagnosis not present

## 2021-10-04 DIAGNOSIS — Z9889 Other specified postprocedural states: Secondary | ICD-10-CM | POA: Diagnosis not present

## 2021-10-04 DIAGNOSIS — J849 Interstitial pulmonary disease, unspecified: Secondary | ICD-10-CM | POA: Diagnosis not present

## 2021-10-04 DIAGNOSIS — I10 Essential (primary) hypertension: Secondary | ICD-10-CM | POA: Diagnosis not present

## 2021-10-04 DIAGNOSIS — I480 Paroxysmal atrial fibrillation: Secondary | ICD-10-CM | POA: Diagnosis not present

## 2021-10-04 DIAGNOSIS — J430 Unilateral pulmonary emphysema [MacLeod's syndrome]: Secondary | ICD-10-CM | POA: Diagnosis not present

## 2021-10-04 DIAGNOSIS — I25118 Atherosclerotic heart disease of native coronary artery with other forms of angina pectoris: Secondary | ICD-10-CM | POA: Diagnosis not present

## 2021-10-04 DIAGNOSIS — E039 Hypothyroidism, unspecified: Secondary | ICD-10-CM | POA: Diagnosis not present

## 2021-10-04 DIAGNOSIS — G4733 Obstructive sleep apnea (adult) (pediatric): Secondary | ICD-10-CM | POA: Diagnosis not present

## 2021-10-04 DIAGNOSIS — Z8679 Personal history of other diseases of the circulatory system: Secondary | ICD-10-CM | POA: Diagnosis not present

## 2021-10-11 ENCOUNTER — Ambulatory Visit (INDEPENDENT_AMBULATORY_CARE_PROVIDER_SITE_OTHER): Payer: Medicare PPO | Admitting: Psychology

## 2021-10-11 DIAGNOSIS — F4323 Adjustment disorder with mixed anxiety and depressed mood: Secondary | ICD-10-CM | POA: Diagnosis not present

## 2021-10-11 NOTE — Progress Notes (Signed)
[facility] Counselor/Therapist Progress Note ? ?Patient ID: [name], MRN: [id number]   ? ?Date: 10/11/21 ? ?Time Spent: 8:07  am - 8:01 am : 54 Minutes ? ?Treatment Type: Individual Therapy. ? ?Reported Symptoms: depression and anxiety.  ? ?Mental Status Exam: ?Appearance:  Casual and Well Groomed     ?Behavior: Appropriate  ?Motor: Normal  ?Speech/Language:  Clear and Coherent  ?Affect: Appropriate  ?Mood: normal  ?Thought process: normal  ?Thought content:   WNL  ?Sensory/Perceptual disturbances:   WNL  ?Orientation: oriented to person, place, time/date, and situation  ?Attention: Good  ?Concentration: Good  ?Memory: WNL  ?Fund of knowledge:  Good  ?Insight:   Good  ?Judgment:  Good  ?Impulse Control: Good  ? ?Risk Assessment: ?Danger to Self:  No ?Self-injurious Behavior: No ?Danger to Others: No ?Duty to Warn:no ?Physical Aggression / Violence:No  ?Access to Firearms a concern: No  ?Gang Involvement:No  ? ?Subjective:  ? ?Idelia Salm Chavis participated in the session, in person in the office with the therapist, and consented to treatment Kwali reviewed the events of the past week. Herndon noted his wife liking to be in control and make the decisions. Curties noted his wife's inability to accept any negative feedback or criticism. "My dad was very disapproving of me". Father was "highly critical". He noted that his father was disappointed that he did not attend [address]. We reviewed numerous treatment approaches including CBT, BA, Problem Solving, and Solution focused therapy. Psych-education regarding the Abed's diagnosis of Adjustment disorder with mixed anxiety and depressed mood was provided during the session. We discussed Anderson Middlebrooks Grivas's goals treatment goals which include wife, improve self-confidence/self-image, processing past events (high need for approval), managing perspective, & process relationship with father. [name] provided verbal approval of the  treatment plan.  ? ? ?Interventions: Psycho-education & Goal Setting.  ? ?Diagnosis:   ?Adjustment disorder with mixed anxiety and depressed mood ? ? ?Treatment Plan: ? ?Client Abilities/Strengths ?Zarif is intelligent, forthcoming, and motivated for change.  ? ?Support System: ?Family ? ?Client Treatment Preferences ?Outpatient therapy.  ? ?Client Statement of Needs ?Bird would like to improving relationship with wife, improve self-confidence/self-image, processing past events (high need for approval), managing perspective, & process relationship with father.  ? ?Treatment Level ?Weekly ? ?Symptoms ? ?GAD: feeling nervous, worrying about different things, difficulty managing worry, irriability, feeling afraid something awufl might happen.    (Status: maintained) ?Depression:  loss of interest, feeling down, disturbed sleep, poor appetite, feeling bad about self, poor self-esteem, difficulty concentrating,   (Status: maintained) ? ?Goals:  ? ?Claiborne experiences symptoms of depression and anxiety.  ? ? ?Target Date: 10/12/22 Frequency: Weekly  ?Progress: 0 Modality: individual  ? ? ?Therapist will provide referrals for additional resources as appropriate.  ?Therapist will provide psycho-education regarding Jhonatan's diagnosis and corresponding treatment approaches and interventions. ?Licensed Clinical Social Worker, Delight Ovens, LCSW will support the patient's ability to achieve the goals identified. will employ CBT, BA, Problem-solving, Solution Focused, Mindfulness,  coping skills, & other evidenced-based practices will be used to promote progress towards healthy functioning to help manage decrease symptoms associated with his diagnosis.  ? Reduce overall level, frequency, and intensity of the feelings of depression and anxiety evidenced by decreased overall symptoms from 6 to 7 days/week to 0 to 1 days/week per client report for at least 3 consecutive months. ?Verbally express understanding of the relationship between  feelings of depression, anxiety and their impact on thinking  patterns and behaviors. ?Verbalize an understanding of the role that distorted thinking plays in creating fears, excessive worry, and ruminations. ? ?(Stanislav participated in the creation of the treatment plan) ? ? ? ?Delight Ovens, LCSW ? ? ?   ?

## 2021-10-19 ENCOUNTER — Encounter: Payer: Self-pay | Admitting: Adult Health

## 2021-10-19 ENCOUNTER — Ambulatory Visit: Payer: Medicare PPO | Admitting: Adult Health

## 2021-10-19 VITALS — BP 112/74 | HR 98 | Temp 97.8°F | Ht 68.0 in | Wt 200.0 lb

## 2021-10-19 DIAGNOSIS — E1165 Type 2 diabetes mellitus with hyperglycemia: Secondary | ICD-10-CM

## 2021-10-19 DIAGNOSIS — I10 Essential (primary) hypertension: Secondary | ICD-10-CM | POA: Diagnosis not present

## 2021-10-19 DIAGNOSIS — N401 Enlarged prostate with lower urinary tract symptoms: Secondary | ICD-10-CM

## 2021-10-19 DIAGNOSIS — R3914 Feeling of incomplete bladder emptying: Secondary | ICD-10-CM

## 2021-10-19 LAB — POCT GLYCOSYLATED HEMOGLOBIN (HGB A1C): HbA1c, POC (controlled diabetic range): 6.4 % (ref 0.0–7.0)

## 2021-10-19 MED ORDER — TADALAFIL 5 MG PO TABS: 5.0000 mg | ORAL_TABLET | Freq: Every day | ORAL | 3 refills | Status: DC

## 2021-10-19 NOTE — Patient Instructions (Signed)
Health Maintenance Due  ?Topic Date Due  ? Pneumonia Vaccine 54+ Years old (3 - PPSV23 if available, else PCV20) 03/27/2021  ? ? ? ? Row Labels 08/02/2021  ?  9:10 AM 07/22/2021  ?  4:04 PM 10/26/2019  ? 10:28 PM  ?Depression screen PHQ 2/9   Section Header. No data exists in this row.     ?Decreased Interest   0 0 0  ?Down, Depressed, Hopeless   0 1 0  ?PHQ - 2 Score   0 1 0  ?Altered sleeping    0   ?Tired, decreased energy    0   ?Change in appetite    0   ?Feeling bad or failure about yourself     0   ?Trouble concentrating    0   ?Moving slowly or fidgety/restless    0   ?Suicidal thoughts    0   ?PHQ-9 Score    1   ?Difficult doing work/chores    Not difficult at all   ? ? ?
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
## 2021-10-22 ENCOUNTER — Encounter: Payer: Self-pay | Admitting: Orthopaedic Surgery

## 2021-10-22 ENCOUNTER — Other Ambulatory Visit: Payer: Self-pay | Admitting: Physician Assistant

## 2021-10-22 MED ORDER — CEPHALEXIN 500 MG PO TABS: 500.0000 mg | ORAL_TABLET | Freq: Once | ORAL | 3 refills | Status: DC | PRN

## 2021-10-25 ENCOUNTER — Ambulatory Visit: Payer: Medicare PPO | Admitting: Psychology

## 2021-10-26 ENCOUNTER — Ambulatory Visit (INDEPENDENT_AMBULATORY_CARE_PROVIDER_SITE_OTHER): Payer: Medicare PPO | Admitting: Psychology

## 2021-10-26 DIAGNOSIS — F4323 Adjustment disorder with mixed anxiety and depressed mood: Secondary | ICD-10-CM | POA: Diagnosis not present

## 2021-10-26 NOTE — Progress Notes (Signed)
[facility] Counselor/Therapist Progress Note  Patient ID: [name], MRN: [id number]    Date: 10/26/21  Time Spent: 2:04  pm - 3:02 pm : 58 Minutes  Treatment Type: Individual Therapy.  Reported Symptoms: depression and anxiety.   Mental Status Exam: Appearance:  Casual and Well Groomed     Behavior: Appropriate  Motor: Normal  Speech/Language:  Clear and Coherent  Affect: Appropriate  Mood: normal  Thought process: normal  Thought content:   WNL  Sensory/Perceptual disturbances:   WNL  Orientation: oriented to person, place, time/date, and situation  Attention: Good  Concentration: Good  Memory: WNL  Fund of knowledge:  Good  Insight:   Good  Judgment:  Good  Impulse Control: Good   Risk Assessment: Danger to Self:  No Self-injurious Behavior: No Danger to Others: No Duty to Warn:no Physical Aggression / Violence:No  Access to Firearms a concern: No  Gang Involvement:No   Subjective:   Aceton Kinnear Hulce participated in the session, in person in the office with the therapist, and consented to treatment Shuan reviewed the events of the past week. Josemiguel noted improvement in his health due to effort managing his diet and general improvement in his medical health. He noted some improvement in his relationship with his wife but noted an additional need for progress. He noted his wife reacting poorly to a minor issue with the laundry. He noted a trend in his relationship in regards to frustration tolerance, general argument trends, and poor communication. We explored this during the session. He noted a need to addressed his own stressors proactively. He noted being avoidant due to his wife's possible reactions when stressed. We explored these stressors and the effect on Luian and his relationship, as a whole. Therapist reviewed positive boundary setting, assertiveness, and reviewed healthy communication and conflict resolution. Therapist modeled this  during the session. Bunny will work on identifying changes he would like to make with their interactions and to differentiate between varying levels of stressors in interactions. Additionally, Jaquann noted his intent to communicate ways to communicate needs, take breaks from disagreements, and set expectations, as  a couple, proactively. Therapist praised Shavon for his effort and energy. Donnelle noted his interest in working on his need for approval during our next session. Therapist provided supportive therapy.    Interventions: interpersonal.   Diagnosis:   Adjustment disorder with mixed anxiety and depressed mood   Treatment Plan:  Client Abilities/Strengths Ruffin is intelligent, forthcoming, and motivated for change.   Support System: Family  Client Treatment Preferences Outpatient therapy.   Client Statement of Needs Brylan would like to improving relationship with wife, improve self-confidence/self-image, processing past events (high need for approval), managing perspective, & process relationship with father.   Treatment Level Weekly  Symptoms  GAD: feeling nervous, worrying about different things, difficulty managing worry, irriability, feeling afraid something awufl might happen.    (Status: maintained) Depression:  loss of interest, feeling down, disturbed sleep, poor appetite, feeling bad about self, poor self-esteem, difficulty concentrating,   (Status: maintained)  Goals:   Kory experiences symptoms of depression and anxiety.    Target Date: 10/12/22 Frequency: Weekly  Progress: 0 Modality: individual    Therapist will provide referrals for additional resources as appropriate.  Therapist will provide psycho-education regarding Everrett's diagnosis and corresponding treatment approaches and interventions. Licensed Clinical Social Worker, Ramos, LCSW will support the patient's ability to achieve the goals identified. will employ CBT, BA, Problem-solving, Solution Focused,  Mindfulness,  coping skills, & other evidenced-based practices will be used to promote progress towards healthy functioning to help manage decrease symptoms associated with his diagnosis.   Reduce overall level, frequency, and intensity of the feelings of depression and anxiety evidenced by decreased overall symptoms from 6 to 7 days/week to 0 to 1 days/week per client report for at least 3 consecutive months. Verbally express understanding of the relationship between feelings of depression, anxiety and their impact on thinking patterns and behaviors. Verbalize an understanding of the role that distorted thinking plays in creating fears, excessive worry, and ruminations.  [name] participated in the creation of the treatment plan)    Delight Ovens, LCSW

## 2021-10-27 ENCOUNTER — Telehealth: Payer: Medicare PPO | Admitting: *Deleted

## 2021-10-27 NOTE — Telephone Encounter (Signed)
Prior [name] has been started for: Tadalafil  5 mg Key: Alphonzo Lemmings

## 2021-11-03 NOTE — Telephone Encounter (Signed)
An appeal has been filed ?

## 2021-11-03 NOTE — Telephone Encounter (Signed)
[name] - PA Case ID: [id number] - Rx #: [id number] Need help? Call us at [phone] Outcome Deniedon May 24 The Medicare rule in the Prescription Drug Benefit Manual (Chapter 6, Section 20.1) says drugs used for the treatment of erectile dysfunction (ED) are excluded from Part D coverage. Your drug is prescribed for ED. Per Medicare rules, it is not covered. Drug Tadalafil 5MG  tablets Form Electronic PA Form

## 2021-11-08 ENCOUNTER — Ambulatory Visit: Payer: Medicare PPO | Admitting: Psychology

## 2021-11-10 DIAGNOSIS — R3912 Poor urinary stream: Secondary | ICD-10-CM | POA: Diagnosis not present

## 2021-11-10 DIAGNOSIS — R350 Frequency of micturition: Secondary | ICD-10-CM | POA: Diagnosis not present

## 2021-11-10 DIAGNOSIS — R3915 Urgency of urination: Secondary | ICD-10-CM | POA: Diagnosis not present

## 2021-11-10 DIAGNOSIS — N401 Enlarged prostate with lower urinary tract symptoms: Secondary | ICD-10-CM | POA: Diagnosis not present

## 2021-11-10 DIAGNOSIS — R351 Nocturia: Secondary | ICD-10-CM | POA: Diagnosis not present

## 2021-12-01 ENCOUNTER — Other Ambulatory Visit: Payer: Self-pay

## 2021-12-01 DIAGNOSIS — N401 Enlarged prostate with lower urinary tract symptoms: Secondary | ICD-10-CM

## 2021-12-01 MED ORDER — TADALAFIL 5 MG PO TABS: 5.0000 mg | ORAL_TABLET | Freq: Every day | ORAL | 3 refills | Status: DC

## 2021-12-01 NOTE — Telephone Encounter (Signed)
Just received an [employer] letter and they stated that I need to contact [employer]. Will call [employer].

## 2021-12-06 ENCOUNTER — Ambulatory Visit: Payer: Medicare PPO | Admitting: Psychology

## 2021-12-06 NOTE — Progress Notes (Unsigned)
° ° ° ° ° ° ° ° ° ° ° ° ° ° °  John Herzberg, LCSW °

## 2021-12-16 NOTE — Telephone Encounter (Signed)
Called pt to see if he received his medication but no answer or vm set up.

## 2021-12-22 DIAGNOSIS — R3911 Hesitancy of micturition: Secondary | ICD-10-CM | POA: Diagnosis not present

## 2021-12-22 DIAGNOSIS — R3915 Urgency of urination: Secondary | ICD-10-CM | POA: Diagnosis not present

## 2021-12-22 DIAGNOSIS — Z125 Encounter for screening for malignant neoplasm of prostate: Secondary | ICD-10-CM | POA: Diagnosis not present

## 2021-12-22 DIAGNOSIS — N401 Enlarged prostate with lower urinary tract symptoms: Secondary | ICD-10-CM | POA: Diagnosis not present

## 2021-12-22 LAB — PSA: PSA: 0.99

## 2021-12-22 NOTE — Telephone Encounter (Signed)
Left detailed message informing  of update. Called [employer] to check on the PA appeal status that was sent in via fax. [employer] representative stated that I would need to call [employer] to do a 2nd appeal. Number provided [phone] and [phone]. Called number and lm for a return call.

## 2021-12-24 NOTE — Telephone Encounter (Signed)
Pt spouse stated that pt has received the Tadalafil. Pt is paying out of pocket for the medication and taking flomax no PA is needed at this time.

## 2022-01-06 DIAGNOSIS — K640 First degree hemorrhoids: Secondary | ICD-10-CM | POA: Diagnosis not present

## 2022-01-06 DIAGNOSIS — Z1211 Encounter for screening for malignant neoplasm of colon: Secondary | ICD-10-CM | POA: Diagnosis not present

## 2022-01-06 DIAGNOSIS — D122 Benign neoplasm of ascending colon: Secondary | ICD-10-CM | POA: Diagnosis not present

## 2022-01-06 DIAGNOSIS — D124 Benign neoplasm of descending colon: Secondary | ICD-10-CM | POA: Diagnosis not present

## 2022-01-06 DIAGNOSIS — K635 Polyp of colon: Secondary | ICD-10-CM | POA: Diagnosis not present

## 2022-01-06 DIAGNOSIS — K5730 Diverticulosis of large intestine without perforation or abscess without bleeding: Secondary | ICD-10-CM | POA: Diagnosis not present

## 2022-01-06 DIAGNOSIS — K648 Other hemorrhoids: Secondary | ICD-10-CM | POA: Diagnosis not present

## 2022-01-06 LAB — HM COLONOSCOPY

## 2022-01-07 ENCOUNTER — Encounter: Payer: Self-pay | Admitting: Adult Health

## 2022-01-10 ENCOUNTER — Ambulatory Visit (INDEPENDENT_AMBULATORY_CARE_PROVIDER_SITE_OTHER): Payer: Medicare PPO | Admitting: Psychology

## 2022-01-10 DIAGNOSIS — F4323 Adjustment disorder with mixed anxiety and depressed mood: Secondary | ICD-10-CM | POA: Diagnosis not present

## 2022-01-10 NOTE — Progress Notes (Signed)
[facility] Counselor/Therapist Progress Note  Patient ID: [name], MRN: [id number]    Date: 01/10/22  Time Spent: 3:35  pm -  4:36 pm : 61 Minutes  Treatment Type: Individual Therapy.  Reported Symptoms: depression and anxiety.   Mental Status Exam: Appearance:  Casual and Well Groomed     Behavior: Appropriate  Motor: Normal  Speech/Language:  Clear and Coherent  Affect: Appropriate  Mood: normal  Thought process: normal  Thought content:   WNL  Sensory/Perceptual disturbances:   WNL  Orientation: oriented to person, place, time/date, and situation  Attention: Good  Concentration: Good  Memory: WNL  Fund of knowledge:  Good  Insight:   Good  Judgment:  Good  Impulse Control: Good   Risk Assessment: Danger to Self:  No Self-injurious Behavior: No Danger to Others: No Duty to Warn:no Physical Aggression / Violence:No  Access to Firearms a concern: No  Gang Involvement:No   Subjective:   [name] participated in the session, in person in the office with the therapist, and consented to treatment [name] reviewed the events of the past week. [name] noted improvement in marital interactions and noted this primarily due to his wife attending therapy consistently. He discussed his need for approval and often "humble bragging". He feels guilty if he solicits compliments, recognition, or feedback. He noted this need in the 5 -10 years. He noted feeling disconnected from close friend due the downturn of their engagement in group activities. [name] disclosed his father being overly critical in regards to his looks (hair and weight), schooling choices, etc. Father only supported [name] in sports. Father was "pissed off" regarding [name]'s school selection and did not provide financial support. Father wanted [name] to attend [employer]. He noted his mother being "neutral". We will continue to process this, going forward and ways to find his own  approval from within. He noted creating a list of pros and cons of his personality as an inventory, which we will process and review. Therapist provided supportive therapy.   Interventions: CBT  Diagnosis:   Adjustment disorder with mixed anxiety and depressed mood   Treatment Plan:  Client Abilities/Strengths [name] is intelligent, forthcoming, and motivated for change.   Support System: Family  Client Treatment Preferences Outpatient therapy.   Client Statement of Needs [name] would like to improving relationship with wife, improve self-confidence/self-image, processing past events (high need for approval), managing perspective, & process relationship with father.   Treatment Level Weekly  Symptoms  GAD: feeling nervous, worrying about different things, difficulty managing worry, irriability, feeling afraid something awufl might happen.    (Status: maintained) Depression:  loss of interest, feeling down, disturbed sleep, poor appetite, feeling bad about self, poor self-esteem, difficulty concentrating,   (Status: maintained)  Goals:   [name] experiences symptoms of depression and anxiety.    Target Date: 10/12/22 Frequency: Weekly  Progress: 0 Modality: individual    Therapist will provide referrals for additional resources as appropriate.  Therapist will provide psycho-education regarding [name]'s diagnosis and corresponding treatment approaches and interventions. Licensed Clinical Social Worker, [place], LCSW will support the patient's ability to achieve the goals identified. will employ CBT, BA, Problem-solving, Solution Focused, Mindfulness,  coping skills, & other evidenced-based practices will be used to promote progress towards healthy functioning to help manage decrease symptoms associated with his diagnosis.   Reduce overall level, frequency, and intensity of the feelings of depression and anxiety evidenced by decreased overall symptoms from 6 to 7 days/week  to 0 to 1  days/week per client report for at least 3 consecutive months. Verbally express understanding of the relationship between feelings of depression, anxiety and their impact on thinking patterns and behaviors. Verbalize an understanding of the role that distorted thinking plays in creating fears, excessive worry, and ruminations.  [name] participated in the creation of the treatment plan)    Delight Ovens, LCSW

## 2022-01-12 DIAGNOSIS — H903 Sensorineural hearing loss, bilateral: Secondary | ICD-10-CM | POA: Diagnosis not present

## 2022-01-19 ENCOUNTER — Ambulatory Visit (INDEPENDENT_AMBULATORY_CARE_PROVIDER_SITE_OTHER): Payer: Medicare PPO | Admitting: Adult Health

## 2022-01-19 ENCOUNTER — Encounter: Payer: Self-pay | Admitting: Adult Health

## 2022-01-19 ENCOUNTER — Ambulatory Visit: Payer: Medicare PPO | Admitting: Adult Health

## 2022-01-19 VITALS — BP 110/70 | HR 95 | Temp 98.3°F | Ht 67.0 in | Wt 202.0 lb

## 2022-01-19 DIAGNOSIS — G4733 Obstructive sleep apnea (adult) (pediatric): Secondary | ICD-10-CM | POA: Diagnosis not present

## 2022-01-19 DIAGNOSIS — I2510 Atherosclerotic heart disease of native coronary artery without angina pectoris: Secondary | ICD-10-CM | POA: Diagnosis not present

## 2022-01-19 DIAGNOSIS — I480 Paroxysmal atrial fibrillation: Secondary | ICD-10-CM

## 2022-01-19 DIAGNOSIS — I10 Essential (primary) hypertension: Secondary | ICD-10-CM

## 2022-01-19 DIAGNOSIS — N401 Enlarged prostate with lower urinary tract symptoms: Secondary | ICD-10-CM | POA: Diagnosis not present

## 2022-01-19 DIAGNOSIS — E1165 Type 2 diabetes mellitus with hyperglycemia: Secondary | ICD-10-CM

## 2022-01-19 DIAGNOSIS — Z0000 Encounter for general adult medical examination without abnormal findings: Secondary | ICD-10-CM

## 2022-01-19 DIAGNOSIS — K219 Gastro-esophageal reflux disease without esophagitis: Secondary | ICD-10-CM | POA: Diagnosis not present

## 2022-01-19 DIAGNOSIS — E039 Hypothyroidism, unspecified: Secondary | ICD-10-CM

## 2022-01-19 DIAGNOSIS — R3914 Feeling of incomplete bladder emptying: Secondary | ICD-10-CM

## 2022-01-19 LAB — MICROALBUMIN / CREATININE URINE RATIO
Creatinine,U: 112.1 mg/dL
Microalb Creat Ratio: 1.1 mg/g (ref 0.0–30.0)
Microalb, Ur: 1.2 mg/dL (ref 0.0–1.9)

## 2022-01-19 LAB — LIPID PANEL
Cholesterol: 128 mg/dL (ref 0–200)
HDL: 47.6 mg/dL (ref >39.00)
LDL Cholesterol: 49 mg/dL (ref 0–99)
NonHDL: 80
Total CHOL/HDL Ratio: 3
Triglycerides: 154 mg/dL — ABNORMAL HIGH (ref 0.0–149.0)
VLDL: 30.8 mg/dL (ref 0.0–40.0)

## 2022-01-19 LAB — CBC WITH DIFFERENTIAL/PLATELET
Basophils Absolute: 0.1 10*3/uL (ref 0.0–0.1)
Basophils Relative: 0.7 % (ref 0.0–3.0)
Eosinophils Absolute: 0.2 10*3/uL (ref 0.0–0.7)
Eosinophils Relative: 2.3 % (ref 0.0–5.0)
HCT: 46.9 % (ref 39.0–52.0)
Hemoglobin: 15.2 g/dL (ref 13.0–17.0)
Lymphocytes Relative: 17.3 % (ref 12.0–46.0)
Lymphs Abs: 1.8 10*3/uL (ref 0.7–4.0)
MCHC: 32.4 g/dL (ref 30.0–36.0)
MCV: 82.8 fl (ref 78.0–100.0)
Monocytes Absolute: 0.9 10*3/uL (ref 0.1–1.0)
Monocytes Relative: 8.8 % (ref 3.0–12.0)
Neutro Abs: 7.6 10*3/uL (ref 1.4–7.7)
Neutrophils Relative %: 70.9 % (ref 43.0–77.0)
Platelets: 252 10*3/uL (ref 150.0–400.0)
RBC: 5.66 Mil/uL (ref 4.22–5.81)
RDW: 15.1 % (ref 11.5–15.5)
WBC: 10.7 10*3/uL — ABNORMAL HIGH (ref 4.0–10.5)

## 2022-01-19 LAB — COMPREHENSIVE METABOLIC PANEL
ALT: 19 U/L (ref 0–53)
AST: 25 U/L (ref 0–37)
Albumin: 4.6 g/dL (ref 3.5–5.2)
Alkaline Phosphatase: 65 U/L (ref 39–117)
BUN: 16 mg/dL (ref 6–23)
CO2: 21 mEq/L (ref 19–32)
Calcium: 9.8 mg/dL (ref 8.4–10.5)
Chloride: 103 mEq/L (ref 96–112)
Creatinine, Ser: 0.89 mg/dL (ref 0.40–1.50)
GFR: 88.57 mL/min (ref >60.00)
Glucose, Bld: 108 mg/dL — ABNORMAL HIGH (ref 70–99)
Potassium: 4.1 mEq/L (ref 3.5–5.1)
Sodium: 134 mEq/L — ABNORMAL LOW (ref 135–145)
Total Bilirubin: 1.3 mg/dL — ABNORMAL HIGH (ref 0.2–1.2)
Total Protein: 8 g/dL (ref 6.0–8.3)

## 2022-01-19 LAB — TSH: TSH: 3.07 u[IU]/mL (ref 0.35–5.50)
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
## 2022-01-19 NOTE — Patient Instructions (Signed)
It was great seeing you today   We will follow up with you regarding your lab work   Please let me know if you need anything   

## 2022-01-20 ENCOUNTER — Other Ambulatory Visit: Payer: Self-pay | Admitting: Adult Health

## 2022-01-20 ENCOUNTER — Encounter: Payer: Self-pay | Admitting: Adult Health

## 2022-01-20 DIAGNOSIS — E1165 Type 2 diabetes mellitus with hyperglycemia: Secondary | ICD-10-CM

## 2022-01-20 DIAGNOSIS — E119 Type 2 diabetes mellitus without complications: Secondary | ICD-10-CM

## 2022-01-20 LAB — HEMOGLOBIN A1C: Hgb A1c MFr Bld: 7.1 % — ABNORMAL HIGH (ref 4.6–6.5)

## 2022-01-20 MED ORDER — TRULICITY 0.75 MG/0.5ML ~~LOC~~ SOAJ: 0.7500 mg | SUBCUTANEOUS | 2 refills | Status: DC

## 2022-01-20 NOTE — Telephone Encounter (Signed)
Please advise 

## 2022-01-21 ENCOUNTER — Other Ambulatory Visit: Payer: Self-pay

## 2022-01-25 MED ORDER — ACCU-CHEK AVIVA VI SOLN: 3 refills | Status: AC

## 2022-01-25 MED ORDER — ACCU-CHEK AVIVA PLUS VI STRP: ORAL_STRIP | 6 refills | Status: DC

## 2022-01-25 MED ORDER — LEVOTHYROXINE SODIUM 75 MCG PO TABS
ORAL_TABLET | ORAL | 3 refills | Status: DC
Start: 2022-01-25 — End: 2023-01-25

## 2022-01-25 MED ORDER — ACCU-CHEK SOFTCLIX LANCETS MISC: 12 refills | Status: DC

## 2022-01-25 MED ORDER — LISINOPRIL 10 MG PO TABS: 10.0000 mg | ORAL_TABLET | Freq: Every day | ORAL | 3 refills | Status: DC

## 2022-01-25 MED ORDER — METFORMIN HCL 1000 MG PO TABS: 1000.0000 mg | ORAL_TABLET | Freq: Two times a day (BID) | ORAL | 1 refills | Status: DC

## 2022-01-25 MED ORDER — OMEPRAZOLE 40 MG PO CPDR
DELAYED_RELEASE_CAPSULE | ORAL | 3 refills | Status: DC
Start: 2022-01-25 — End: 2023-02-01

## 2022-01-25 MED ORDER — ROSUVASTATIN CALCIUM 10 MG PO TABS
10.0000 mg | ORAL_TABLET | Freq: Every day | ORAL | 3 refills | Status: DC
Start: 2022-01-25 — End: 2023-02-17

## 2022-01-25 NOTE — Telephone Encounter (Signed)
Please advise on labs.

## 2022-01-26 ENCOUNTER — Other Ambulatory Visit: Payer: Self-pay | Admitting: Adult Health

## 2022-02-03 ENCOUNTER — Encounter: Payer: Self-pay | Admitting: Adult Health

## 2022-02-08 DIAGNOSIS — M5416 Radiculopathy, lumbar region: Secondary | ICD-10-CM | POA: Diagnosis not present

## 2022-02-08 DIAGNOSIS — M5116 Intervertebral disc disorders with radiculopathy, lumbar region: Secondary | ICD-10-CM | POA: Diagnosis not present

## 2022-02-08 DIAGNOSIS — M48061 Spinal stenosis, lumbar region without neurogenic claudication: Secondary | ICD-10-CM | POA: Diagnosis not present

## 2022-02-14 ENCOUNTER — Encounter: Payer: Self-pay | Admitting: Adult Health

## 2022-02-15 ENCOUNTER — Ambulatory Visit (INDEPENDENT_AMBULATORY_CARE_PROVIDER_SITE_OTHER): Payer: Medicare PPO | Admitting: Psychology

## 2022-02-15 DIAGNOSIS — F4323 Adjustment disorder with mixed anxiety and depressed mood: Secondary | ICD-10-CM | POA: Diagnosis not present
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
## 2022-02-16 NOTE — Telephone Encounter (Signed)
Please advise 

## 2022-02-17 ENCOUNTER — Encounter: Payer: Self-pay | Admitting: Adult Health

## 2022-02-28 ENCOUNTER — Encounter: Payer: Self-pay | Admitting: Adult Health

## 2022-03-21 ENCOUNTER — Ambulatory Visit (INDEPENDENT_AMBULATORY_CARE_PROVIDER_SITE_OTHER): Payer: Medicare PPO | Admitting: Psychology

## 2022-03-21 DIAGNOSIS — F4323 Adjustment disorder with mixed anxiety and depressed mood: Secondary | ICD-10-CM

## 2022-03-21 NOTE — Progress Notes (Signed)
[facility] Counselor/Therapist Progress Note  Patient ID: [name], MRN: [id number]    Date: 03/21/22  Time Spent:  3:33 pm -  4:31 pm :  58 Minutes  Treatment Type: Individual Therapy.  Reported Symptoms: depression and anxiety.   Mental Status Exam: Appearance:  Casual and Well Groomed     Behavior: Appropriate  Motor: Normal  Speech/Language:  Clear and Coherent  Affect: Appropriate  Mood: normal  Thought process: normal  Thought content:   WNL  Sensory/Perceptual disturbances:   WNL  Orientation: oriented to person, place, time/date, and situation  Attention: Good  Concentration: Good  Memory: WNL  Fund of knowledge:  Good  Insight:   Good  Judgment:  Good  Impulse Control: Good   Risk Assessment: Danger to Self:  No Self-injurious Behavior: No Danger to Others: No Duty to Warn:no Physical Aggression / Violence:No  Access to Firearms a concern: No  Gang Involvement:No   Subjective:   Sarim Rothman Siebels participated in the session, in person in the office with the therapist, and consented to treatment Geoffrey reviewed the events of the past week. Istvan noted taking a trip with his wife and noted enjoying the trip overall but noted some stressors during the trip. He noted on working on communicating his needs and noted negotiating ways to achieve the goals. He noted some continued struggles with resolving conflict with his wife and noted his attempts to resolve conflict but noted his wife's approach being inconsistent when upset. We continued to review conflict resolution and ways to identify, communicate, and set boundaries. Therapist praised [name] for his effort between sessions to employ tactics and tools. We discussed the importance of continuing couple's counseling.Therapist provided supportive therapy and a follow-up was scheduled for continued treatment.   Interventions: interpersonal  Diagnosis:   Adjustment disorder with mixed anxiety  and depressed mood  Treatment Plan:  Client Abilities/Strengths Kaven is intelligent, forthcoming, and motivated for change.   Support System: Family  Client Treatment Preferences Outpatient therapy.   Client Statement of Needs Sian would like to improving relationship with wife, improve self-confidence/self-image, processing past events (high need for approval), managing perspective, & process relationship with father.   Treatment Level Weekly  Symptoms  GAD: feeling nervous, worrying about different things, difficulty managing worry, irriability, feeling afraid something awufl might happen.    (Status: maintained) Depression:  loss of interest, feeling down, disturbed sleep, poor appetite, feeling bad about self, poor self-esteem, difficulty concentrating,   (Status: maintained)  Goals:   Becky experiences symptoms of depression and anxiety.    Target Date: 10/12/22 Frequency: Weekly  Progress: 0 Modality: individual    Therapist will provide referrals for additional resources as appropriate.  Therapist will provide psycho-education regarding Kaidin's diagnosis and corresponding treatment approaches and interventions. Licensed Clinical Social Worker, [place], LCSW will support the patient's ability to achieve the goals identified. will employ CBT, BA, Problem-solving, Solution Focused, Mindfulness,  coping skills, & other evidenced-based practices will be used to promote progress towards healthy functioning to help manage decrease symptoms associated with his diagnosis.   Reduce overall level, frequency, and intensity of the feelings of depression and anxiety evidenced by decreased overall symptoms from 6 to 7 days/week to 0 to 1 days/week per client report for at least 3 consecutive months. Verbally express understanding of the relationship between feelings of depression, anxiety and their impact on thinking patterns and behaviors. Verbalize an understanding of the role that  distorted thinking plays in creating  fears, excessive worry, and ruminations.  [name] participated in the creation of the treatment plan)    Buena Irish, LCSW

## 2022-03-30 ENCOUNTER — Ambulatory Visit: Payer: Medicare PPO | Admitting: Adult Health

## 2022-03-30 ENCOUNTER — Encounter: Payer: Self-pay | Admitting: Adult Health

## 2022-03-30 VITALS — BP 100/60 | HR 105 | Temp 98.0°F | Ht 67.0 in | Wt 202.0 lb

## 2022-03-30 DIAGNOSIS — R3915 Urgency of urination: Secondary | ICD-10-CM | POA: Diagnosis not present

## 2022-03-30 DIAGNOSIS — R350 Frequency of micturition: Secondary | ICD-10-CM | POA: Diagnosis not present

## 2022-03-30 DIAGNOSIS — E1165 Type 2 diabetes mellitus with hyperglycemia: Secondary | ICD-10-CM

## 2022-03-30 DIAGNOSIS — N401 Enlarged prostate with lower urinary tract symptoms: Secondary | ICD-10-CM | POA: Diagnosis not present

## 2022-03-30 DIAGNOSIS — R3911 Hesitancy of micturition: Secondary | ICD-10-CM | POA: Diagnosis not present

## 2022-03-30 DIAGNOSIS — I10 Essential (primary) hypertension: Secondary | ICD-10-CM | POA: Diagnosis not present

## 2022-03-30 DIAGNOSIS — R351 Nocturia: Secondary | ICD-10-CM | POA: Diagnosis not present

## 2022-03-30 DIAGNOSIS — N5201 Erectile dysfunction due to arterial insufficiency: Secondary | ICD-10-CM | POA: Diagnosis not present

## 2022-03-30 LAB — POCT GLYCOSYLATED HEMOGLOBIN (HGB A1C): Hemoglobin A1C: 8.4 % — AB (ref 4.0–5.6)

## 2022-03-30 LAB — GLUCOSE, POCT (MANUAL RESULT ENTRY): POC Glucose: 144 mg/dl — AB (ref 70–99)

## 2022-03-30 MED ORDER — SEMAGLUTIDE(0.25 OR 0.5MG/DOS) 2 MG/3ML ~~LOC~~ SOPN: 0.2500 mg | PEN_INJECTOR | SUBCUTANEOUS | 0 refills | Status: DC

## 2022-03-30 MED ORDER — METFORMIN HCL 500 MG PO TABS: 1000.0000 mg | ORAL_TABLET | Freq: Two times a day (BID) | ORAL | 0 refills | Status: DC
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
## 2022-03-31 ENCOUNTER — Encounter: Payer: Self-pay | Admitting: Adult Health

## 2022-04-19 ENCOUNTER — Ambulatory Visit (INDEPENDENT_AMBULATORY_CARE_PROVIDER_SITE_OTHER): Payer: Medicare PPO | Admitting: Psychology

## 2022-04-19 DIAGNOSIS — F4323 Adjustment disorder with mixed anxiety and depressed mood: Secondary | ICD-10-CM | POA: Diagnosis not present

## 2022-04-19 NOTE — Progress Notes (Signed)
[facility] Counselor/Therapist Progress Note  Patient ID: [name], MRN: [id number]    Date: 04/19/22  Time Spent:  3:04 pm -  4:05 pm:  61 Minutes  Treatment Type: Individual Therapy.  Reported Symptoms: depression and anxiety.   Mental Status Exam: Appearance:  Casual and Well Groomed     Behavior: Appropriate  Motor: Normal  Speech/Language:  Clear and Coherent  Affect: Appropriate  Mood: normal  Thought process: normal  Thought content:   WNL  Sensory/Perceptual disturbances:   WNL  Orientation: oriented to person, place, time/date, and situation  Attention: Good  Concentration: Good  Memory: WNL  Fund of knowledge:  Good  Insight:   Good  Judgment:  Good  Impulse Control: Good   Risk Assessment: Danger to Self:  No Self-injurious Behavior: No Danger to Others: No Duty to Warn:no Physical Aggression / Violence:No  Access to Firearms a concern: No  Gang Involvement:No   Subjective:   Edris Friedt Fife participated in the session, in person in the office with the therapist, and consented to treatment Dionysios reviewed the events of the past week. Theophile noted continuing to engage in couple's counseling and is working a recent Higher education careers adviser. He noted his wife's response to a lack of control and is often getting angry and, at times, "mean". He noted having a discussion with his wife about how her anger is often displaced towards him and the effect on his mood and their relationship. He noted this conversation leading to some progress. He noted his wife's unilateral decision to move in her son Ramon Dredge) without communication and joint decision-making. We explored this during the session and Keithan's building frustration regarding this situation, the lack of a timeline, and the worry that his wife's reaction would be poor to receiving feedback. We discussed  his concerns and discussed ways to manage his anxiety and communicate needs assertively, positively,  and consistently. Therapist modeled this during the session. He discussed the discomfort of the cold shoulder he receives from his wife in the past. Therapist validated and normalized Ebrahim's feelings and provided supportive therapy.   Interventions: interpersonal  Diagnosis:   Adjustment disorder with mixed anxiety and depressed mood  Treatment Plan:  Client Abilities/Strengths Elmond is intelligent, forthcoming, and motivated for change.   Support System: Family  Client Treatment Preferences Outpatient therapy.   Client Statement of Needs Claborn would like to improving relationship with wife, improve self-confidence/self-image, processing past events (high need for approval), managing perspective, & process relationship with father.   Treatment Level Weekly  Symptoms  GAD: feeling nervous, worrying about different things, difficulty managing worry, irriability, feeling afraid something awufl might happen.    (Status: improved) Depression:  loss of interest, feeling down, disturbed sleep, poor appetite, feeling bad about self, poor self-esteem, difficulty concentrating,   (Status: maintained)  Goals:   Jase experiences symptoms of depression and anxiety.    Target Date: 10/12/22 Frequency: Weekly  Progress: 0 Modality: individual    Therapist will provide referrals for additional resources as appropriate.  Therapist will provide psycho-education regarding Levert's diagnosis and corresponding treatment approaches and interventions. Licensed Clinical Social Worker, [place], LCSW will support the patient's ability to achieve the goals identified. will employ CBT, BA, Problem-solving, Solution Focused, Mindfulness,  coping skills, & other evidenced-based practices will be used to promote progress towards healthy functioning to help manage decrease symptoms associated with his diagnosis.   Reduce overall level, frequency, and intensity of the feelings of depression and anxiety  evidenced by decreased overall symptoms from 6 to 7 days/week to 0 to 1 days/week per client report for at least 3 consecutive months. Verbally express understanding of the relationship between feelings of depression, anxiety and their impact on thinking patterns and behaviors. Verbalize an understanding of the role that distorted thinking plays in creating fears, excessive worry, and ruminations.  [name] participated in the creation of the treatment plan)    Delight Ovens, LCSW

## 2022-04-27 ENCOUNTER — Ambulatory Visit (INDEPENDENT_AMBULATORY_CARE_PROVIDER_SITE_OTHER): Payer: Medicare PPO | Admitting: Adult Health

## 2022-04-27 ENCOUNTER — Ambulatory Visit: Payer: Medicare PPO | Admitting: Adult Health

## 2022-04-27 VITALS — BP 100/70 | HR 80 | Temp 97.8°F | Ht 67.0 in | Wt 196.0 lb

## 2022-04-27 DIAGNOSIS — E1165 Type 2 diabetes mellitus with hyperglycemia: Secondary | ICD-10-CM

## 2022-04-27 MED ORDER — SEMAGLUTIDE(0.25 OR 0.5MG/DOS) 2 MG/3ML ~~LOC~~ SOPN: 0.5000 mg | PEN_INJECTOR | SUBCUTANEOUS | 1 refills | Status: DC
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
## 2022-05-05 DIAGNOSIS — M5116 Intervertebral disc disorders with radiculopathy, lumbar region: Secondary | ICD-10-CM | POA: Diagnosis not present

## 2022-05-05 DIAGNOSIS — M5416 Radiculopathy, lumbar region: Secondary | ICD-10-CM | POA: Diagnosis not present

## 2022-05-11 DIAGNOSIS — N401 Enlarged prostate with lower urinary tract symptoms: Secondary | ICD-10-CM | POA: Diagnosis not present

## 2022-05-11 DIAGNOSIS — R3911 Hesitancy of micturition: Secondary | ICD-10-CM | POA: Diagnosis not present

## 2022-05-11 DIAGNOSIS — N5201 Erectile dysfunction due to arterial insufficiency: Secondary | ICD-10-CM | POA: Diagnosis not present

## 2022-05-11 DIAGNOSIS — R3915 Urgency of urination: Secondary | ICD-10-CM | POA: Diagnosis not present

## 2022-05-12 DIAGNOSIS — I480 Paroxysmal atrial fibrillation: Secondary | ICD-10-CM | POA: Diagnosis not present

## 2022-05-12 DIAGNOSIS — E7800 Pure hypercholesterolemia, unspecified: Secondary | ICD-10-CM | POA: Diagnosis not present

## 2022-05-12 DIAGNOSIS — R809 Proteinuria, unspecified: Secondary | ICD-10-CM | POA: Diagnosis not present

## 2022-05-12 DIAGNOSIS — I25118 Atherosclerotic heart disease of native coronary artery with other forms of angina pectoris: Secondary | ICD-10-CM | POA: Diagnosis not present

## 2022-05-12 DIAGNOSIS — I10 Essential (primary) hypertension: Secondary | ICD-10-CM | POA: Diagnosis not present

## 2022-05-12 DIAGNOSIS — E1129 Type 2 diabetes mellitus with other diabetic kidney complication: Secondary | ICD-10-CM | POA: Diagnosis not present

## 2022-05-12 DIAGNOSIS — I700 Atherosclerosis of aorta: Secondary | ICD-10-CM | POA: Diagnosis not present

## 2022-05-16 ENCOUNTER — Ambulatory Visit (INDEPENDENT_AMBULATORY_CARE_PROVIDER_SITE_OTHER): Payer: Medicare PPO | Admitting: Psychology

## 2022-05-16 DIAGNOSIS — F4323 Adjustment disorder with mixed anxiety and depressed mood: Secondary | ICD-10-CM | POA: Diagnosis not present

## 2022-05-16 NOTE — Progress Notes (Unsigned)
[facility] Counselor/Therapist Progress Note  Patient ID: Catrell Morrone, MRN: [id number]    Date: 05/17/22  Time Spent:  3:10 pm - 4:04 pm:  54 Minutes  Treatment Type: Individual Therapy.  Reported Symptoms: depression and anxiety.   Mental Status Exam: Appearance:  Casual and Well Groomed     Behavior: Appropriate  Motor: Normal  Speech/Language:  Clear and Coherent  Affect: Appropriate  Mood: anxious  Thought process: normal  Thought content:   WNL  Sensory/Perceptual disturbances:   WNL  Orientation: oriented to person, place, time/date, and situation  Attention: Good  Concentration: Good  Memory: WNL  Fund of knowledge:  Good  Insight:   Good  Judgment:  Good  Impulse Control: Good   Risk Assessment: Danger to Self:  No Self-injurious Behavior: No Danger to Others: No Duty to Warn:no Physical Aggression / Violence:No  Access to Firearms a concern: No  Gang Involvement:No   Subjective:   Shmuel Girgis Monterosso participated in the session, in person in the office with the therapist, and consented to treatment Jaydyn reviewed the events of the past week.He provided an update regarding his step-son's Ramon Dredge) stay at their home. He noted walking on eggshells at home due to his worry that his wife, Randa Evens mother, would react poorly to feedback. He noted that his step-son has moved out, back to his home. He noted frustration regarding his wife's unilateral decision to update his home. He noted discomfort with communicating is boundaries to his wife and step-son due to his wife's difficulty to receive feedback. We continued to explore this. He noted a need to focus on managing his irritability. He noted often being irritated by "little things" like losing his keys & being unable to pick-up an object on the first try. He noted his irritation doesn't last very long but noted feeling annoyed by it. He postulated that this could be due to aging physically and  cognitively. We began to process this during the session. He noted feeling depressed, at times, at being more irritable. He noted this part of himself that he doesn't "like". Therapist encouraged Whitaker to identify triggers and possible contributing factors. Therapist provided handouts for Wise-mind and [employer]ical Acceptance to be read between sessions and discussed during our follow-up. Hermenegildo noted noted losing ~20# and feeling physically very good as a result. Therapist praised Colon for his effort.     Interventions: interpersonal  Diagnosis:   Adjustment disorder with mixed anxiety and depressed mood  Treatment Plan:  Client Abilities/Strengths Heron is intelligent, forthcoming, and motivated for change.   Support System: Family  Client Treatment Preferences Outpatient therapy.   Client Statement of Needs Dvontae would like to improving relationship with wife, improve self-confidence/self-image, processing past events (high need for approval), managing perspective, & process relationship with father.   Treatment Level Weekly  Symptoms  GAD: feeling nervous, worrying about different things, difficulty managing worry, irriability, feeling afraid something awufl might happen.    (Status: improved) Depression:  loss of interest, feeling down, disturbed sleep, poor appetite, feeling bad about self, poor self-esteem, difficulty concentrating,   (Status: maintained)  Goals:   Tayshun experiences symptoms of depression and anxiety.    Target Date: 10/12/22 Frequency: Weekly  Progress: 0 Modality: individual    Therapist will provide referrals for additional resources as appropriate.  Therapist will provide psycho-education regarding Serapio's diagnosis and corresponding treatment approaches and interventions. Licensed Clinical Social Worker, [place], LCSW will support the patient's ability to achieve the goals  identified. will employ CBT, BA, Problem-solving, Solution Focused, Mindfulness,   coping skills, & other evidenced-based practices will be used to promote progress towards healthy functioning to help manage decrease symptoms associated with his diagnosis.   Reduce overall level, frequency, and intensity of the feelings of depression and anxiety evidenced by decreased overall symptoms from 6 to 7 days/week to 0 to 1 days/week per client report for at least 3 consecutive months. Verbally express understanding of the relationship between feelings of depression, anxiety and their impact on thinking patterns and behaviors. Verbalize an understanding of the role that distorted thinking plays in creating fears, excessive worry, and ruminations.  [name] participated in the creation of the treatment plan)    Delight Ovens, LCSW

## 2022-05-17 ENCOUNTER — Telehealth: Payer: Self-pay | Admitting: *Deleted

## 2022-05-17 ENCOUNTER — Encounter: Payer: Self-pay | Admitting: *Deleted

## 2022-05-17 NOTE — Patient Outreach (Signed)
  Care Coordination   Initial Visit Note   05/17/2022 Name: [name] MRN: [id number] DOB: 1955-11-26  [name] is a 66 y.o. year old male who sees Nafziger, Kandee Keen, NP for primary care. I spoke with  [name] by phone today.  What matters to the patients health and wellness today?  No needs    Goals Addressed               This Visit's Progress     COMPLETED: No needs (pt-stated)        Care Coordination Interventions: Reviewed medications with patient and discussed adherence with no needed refills Reviewed scheduled/upcoming provider appointments including sufficient transportation source Assessed social determinant of health barriers          SDOH assessments and interventions completed:  Yes  SDOH Interventions Today    Flowsheet Row Most Recent Value  SDOH Interventions   Food Insecurity Interventions Intervention Not Indicated  Housing Interventions Intervention Not Indicated  Transportation Interventions Intervention Not Indicated  Utilities Interventions Intervention Not Indicated        Care Coordination Interventions:  Yes, provided   Follow up plan: No further intervention required.   Encounter Outcome:  Pt. Visit Completed   Elliot Cousin, RN Care Management Coordinator [facility] Main Office [phone]

## 2022-05-17 NOTE — Patient Instructions (Signed)
Visit Information  Thank you for taking time to visit with me today. Please don't hesitate to contact me if I can be of assistance to you.   Following are the goals we discussed today:   Goals Addressed               This Visit's Progress     COMPLETED: No needs (pt-stated)        Care Coordination Interventions: Reviewed medications with patient and discussed adherence with no needed refills Reviewed scheduled/upcoming provider appointments including sufficient transportation source Assessed social determinant of health barriers          Please call the care guide team at [phone] if you need to cancel or reschedule your appointment.   If you are experiencing a Mental Health or Behavioral Health Crisis or need someone to talk to, please call the Suicide and Crisis Lifeline: 988  Patient verbalizes understanding of instructions and care plan provided today and agrees to view in MyChart. Active MyChart status and patient understanding of how to access instructions and care plan via MyChart confirmed with patient.     No further follow up required: No needs  Elliot Cousin, RN Care Management Coordinator [facility] Main Office [phone]

## 2022-05-31 ENCOUNTER — Telehealth: Payer: Self-pay

## 2022-05-31 ENCOUNTER — Ambulatory Visit: Payer: Medicare PPO | Admitting: Family Medicine

## 2022-05-31 ENCOUNTER — Encounter: Payer: Self-pay | Admitting: Family Medicine

## 2022-05-31 VITALS — BP 96/64 | HR 107 | Temp 98.2°F | Wt 182.0 lb

## 2022-05-31 DIAGNOSIS — M79604 Pain in right leg: Secondary | ICD-10-CM

## 2022-05-31 DIAGNOSIS — M79605 Pain in left leg: Secondary | ICD-10-CM

## 2022-05-31 DIAGNOSIS — I10 Essential (primary) hypertension: Secondary | ICD-10-CM | POA: Diagnosis not present

## 2022-05-31 DIAGNOSIS — E119 Type 2 diabetes mellitus without complications: Secondary | ICD-10-CM

## 2022-05-31 DIAGNOSIS — I480 Paroxysmal atrial fibrillation: Secondary | ICD-10-CM | POA: Diagnosis not present

## 2022-05-31 DIAGNOSIS — I2510 Atherosclerotic heart disease of native coronary artery without angina pectoris: Secondary | ICD-10-CM | POA: Diagnosis not present

## 2022-05-31 MED ORDER — GLIPIZIDE 2.5 MG PO TABS: 2.5000 mg | ORAL_TABLET | Freq: Two times a day (BID) | ORAL | 0 refills | Status: DC | PRN

## 2022-05-31 NOTE — Progress Notes (Signed)
   Subjective:    Patient ID: [name], male    DOB: 05-14-1956, 66 y.o.   MRN: [id number]  HPI Here with his wife for several issues. First he has felt very weak all over and has been mildly lightheaded for several weeks. His BP has been quite low at home, usually in the 80's over 60's recently. He takes Metoprolol succinate 25 mg daily and Lisinopril 10 mg daily. His glucoses have also been getting higher over the past few weeks, even though his diet has not changed. He takes Metformin 1000 mg BID and Ozempic, but his glucoses at home have been averaging 160-200 fasting. His last A1c on 03-30-22 saw 8.4%. He had taken Glipizide XL 5 mg once daily in addition to his current medications, but this was stopped earlier this year due to hypoglycemic spells. Lastly he complains of his legs feeling could all the time, and he has had aching pains in the legs after he exercises for the past month. No chest pain or SOB. He has a hx of PAF and CAD.    Review of Systems  Constitutional:  Positive for fatigue. Negative for fever.  Respiratory: Negative.    Cardiovascular: Negative.   Gastrointestinal: Negative.   Genitourinary: Negative.   Musculoskeletal:  Positive for myalgias.  Neurological:  Positive for light-headedness. Negative for dizziness.       Objective:   Physical Exam Constitutional:      Appearance: Normal appearance. He is not ill-appearing.  Cardiovascular:     Rate and Rhythm: Regular rhythm. Tachycardia present.     Heart sounds: Normal heart sounds.     Comments: Pedal pulses are quite weak on both sides. Skin is cool and dry, but pink  Pulmonary:     Effort: Pulmonary effort is normal.     Breath sounds: Normal breath sounds.  Neurological:     Mental Status: He is alert.           Assessment & Plan:  He has been hypotensive, so we will stop the Lisinopril. For the diabetes, we will add Glipizide 2.5 mg BID if his fasting glucoses exceed 180. He will  follow up with [name], his PCP in 2 weeks. We will get arterial dopplers of the legs to rule out PAD.  John Crane, MD

## 2022-05-31 NOTE — Telephone Encounter (Signed)
--  Caller states husband been having leg pain, and difficulty sleeping. He has been weak with high blood sugar around 223 and low blood pressure read 77/62 Sunday, 81/67 yesterday, runs low, pulse 107bpms, drank a lot of water, improved. He has a history of A-Fib. Currently sleeping, won't wake him up. MI, COPD.  05/31/2022 8:20:34 AM See HCP within 4 Hours (or PCP triage) Izora Ribas, RN, Melanie  Comments User: Patria Mane, RN Date/Time Lamount Cohen Time): 05/31/2022 8:15:22 AM CPAP  User: Patria Mane, RN Date/Time Lamount Cohen Time): 05/31/2022 8:17:14 AM Steriod inj in back on 05/05/22  User: Patria Mane, RN Date/Time (Eastern Time): 05/31/2022 8:21:59 AM No answer on backline, unable to leave a message.  Referrals GO TO FACILITY UNDECIDED REFERRED TO PCP OFFICE Warm transfer to backline  Pt has appt with Dr Clent Ridges today.

## 2022-06-02 ENCOUNTER — Encounter (HOSPITAL_BASED_OUTPATIENT_CLINIC_OR_DEPARTMENT_OTHER): Payer: Self-pay | Admitting: Emergency Medicine

## 2022-06-02 ENCOUNTER — Emergency Department (HOSPITAL_BASED_OUTPATIENT_CLINIC_OR_DEPARTMENT_OTHER)
Admission: EM | Admit: 2022-06-02 | Discharge: 2022-06-03 | Disposition: A | Discharge status: Home / Self Care | Payer: Medicare PPO | Attending: Emergency Medicine | Admitting: Emergency Medicine

## 2022-06-02 ENCOUNTER — Other Ambulatory Visit: Payer: Self-pay

## 2022-06-02 DIAGNOSIS — I493 Ventricular premature depolarization: Secondary | ICD-10-CM | POA: Insufficient documentation

## 2022-06-02 DIAGNOSIS — I10 Essential (primary) hypertension: Secondary | ICD-10-CM | POA: Diagnosis not present

## 2022-06-02 DIAGNOSIS — R5383 Other fatigue: Secondary | ICD-10-CM | POA: Diagnosis not present

## 2022-06-02 DIAGNOSIS — E119 Type 2 diabetes mellitus without complications: Secondary | ICD-10-CM | POA: Diagnosis not present

## 2022-06-02 DIAGNOSIS — Z79899 Other long term (current) drug therapy: Secondary | ICD-10-CM | POA: Diagnosis not present

## 2022-06-02 DIAGNOSIS — Z7984 Long term (current) use of oral hypoglycemic drugs: Secondary | ICD-10-CM | POA: Diagnosis not present

## 2022-06-02 DIAGNOSIS — R531 Weakness: Secondary | ICD-10-CM | POA: Diagnosis not present

## 2022-06-02 LAB — TROPONIN I (HIGH SENSITIVITY): Troponin I (High Sensitivity): 7 ng/L (ref <18)

## 2022-06-02 LAB — URINALYSIS, ROUTINE W REFLEX MICROSCOPIC
Bilirubin Urine: NEGATIVE
Glucose, UA: NEGATIVE mg/dL
Hgb urine dipstick: NEGATIVE
Ketones, ur: NEGATIVE mg/dL
Leukocytes,Ua: NEGATIVE
Nitrite: NEGATIVE
Protein, ur: NEGATIVE mg/dL
Specific Gravity, Urine: <=1.005 (ref 1.005–1.030)
pH: 5.5 (ref 5.0–8.0)

## 2022-06-02 LAB — CBC WITH DIFFERENTIAL/PLATELET
Abs Immature Granulocytes: 0.2 10*3/uL — ABNORMAL HIGH (ref 0.00–0.07)
Basophils Absolute: 0.1 10*3/uL (ref 0.0–0.1)
Basophils Relative: 1 %
Eosinophils Absolute: 0.6 10*3/uL — ABNORMAL HIGH (ref 0.0–0.5)
Eosinophils Relative: 7 %
HCT: 41.3 % (ref 39.0–52.0)
Hemoglobin: 14.3 g/dL (ref 13.0–17.0)
Immature Granulocytes: 2 %
Lymphocytes Relative: 14 %
Lymphs Abs: 1.2 10*3/uL (ref 0.7–4.0)
MCH: 28.4 pg (ref 26.0–34.0)
MCHC: 34.6 g/dL (ref 30.0–36.0)
MCV: 82.1 fL (ref 80.0–100.0)
Monocytes Absolute: 0.9 10*3/uL (ref 0.1–1.0)
Monocytes Relative: 10 %
Neutro Abs: 5.9 10*3/uL (ref 1.7–7.7)
Neutrophils Relative %: 66 %
Platelets: 245 10*3/uL (ref 150–400)
RBC: 5.03 MIL/uL (ref 4.22–5.81)
RDW: 13.9 % (ref 11.5–15.5)
WBC: 8.9 10*3/uL (ref 4.0–10.5)
nRBC: 0 % (ref 0.0–0.2)

## 2022-06-02 LAB — COMPREHENSIVE METABOLIC PANEL
ALT: 233 U/L — ABNORMAL HIGH (ref 0–44)
AST: 99 U/L — ABNORMAL HIGH (ref 15–41)
Albumin: 3.5 g/dL (ref 3.5–5.0)
Alkaline Phosphatase: 247 U/L — ABNORMAL HIGH (ref 38–126)
Anion gap: 12 (ref 5–15)
BUN: 18 mg/dL (ref 8–23)
CO2: 22 mmol/L (ref 22–32)
Calcium: 9.9 mg/dL (ref 8.9–10.3)
Chloride: 98 mmol/L (ref 98–111)
Creatinine, Ser: 0.76 mg/dL (ref 0.61–1.24)
GFR, Estimated: >60 mL/min (ref >60)
Glucose, Bld: 117 mg/dL — ABNORMAL HIGH (ref 70–99)
Potassium: 4.1 mmol/L (ref 3.5–5.1)
Sodium: 132 mmol/L — ABNORMAL LOW (ref 135–145)
Total Bilirubin: 1.3 mg/dL — ABNORMAL HIGH (ref 0.3–1.2)
Total Protein: 7.7 g/dL (ref 6.5–8.1)

## 2022-06-02 LAB — PROTIME-INR
INR: 1.2 (ref 0.8–1.2)
Prothrombin Time: 14.9 seconds (ref 11.4–15.2)

## 2022-06-02 MED ORDER — LACTATED RINGERS IV BOLUS
1000.0000 mL | Freq: Once | INTRAVENOUS | Status: AC
  Administered 2022-06-02: 1000 mL via INTRAVENOUS

## 2022-06-02 NOTE — ED Provider Notes (Signed)
[facility] EMERGENCY DEPARTMENT  Provider Note  CSN: [id number] Arrival date & time: 06/02/22 1814  History Chief Complaint  Patient presents with   Fatigue    [name] is a 66 y.o. male with history of HTN and DM was started on Ozempic about 6 weeks ago. He has had significant weightless dating back to October (20lbs) by diet and meds. In the last week he has had generalized weakness, low BP and increased glucose (attributed to a steroid injection he got in his back). His wife has a log of BP readings into the 80s most days. He reports some insomnia and some leg cramping. He saw his PCP 2 days ago and had his Lisinopril stopped and glipizide added. Since then he has also been drinking more fluids because he thought he might be dehydrated. His leg cramping has resolved and BP is improving although still low at times. His wife is specifically worried about him being anemia because he reports his urine is sometimes clear and sometimes dark.    Home Medications Prior to Admission medications   Medication Sig Start Date End Date Taking? Authorizing Provider  Accu-Chek Softclix Lancets lancets USE TO CHECK BLOOD SUGARS 1 TO 2 TIMES DAILY. 01/25/22   Nafziger, Tommi Rumps, NP  Blood Glucose Calibration (ACCU-CHEK AVIVA) SOLN Use with machine. 01/25/22   Nafziger, Tommi Rumps, NP  Cephalexin 500 MG tablet Take 500 mg by mouth 4 (four) times daily. FOR DENTAL PROCEDURES    [provider]  Chelated Magnesium 100 MG TABS in the morning, at noon, and at bedtime. 04/01/20   [provider]  Cholecalciferol (VITAMIN D3) 2000 units capsule Take 2,000 Units by mouth daily.    [provider]  clobetasol ointment (TEMOVATE) 0.05 % Apply topically 2 (two) times daily as needed. 09/25/19   Nafziger, Tommi Rumps, NP  DENTA 5000 PLUS 1.1 % CREA dental cream See admin instructions. 12/30/20   [provider]  ELIQUIS 5 MG TABS tablet Take 5 mg by mouth 2 (two) times daily.  06/20/19   [provider]  EPINEPHrine (EPIPEN 2-PAK) 0.3 mg/0.3 mL IJ SOAJ injection USE AS DIRECTED 09/25/19   Nafziger, Tommi Rumps, NP  fluticasone (FLONASE) 50 MCG/ACT nasal spray Place 2 sprays into both nostrils daily as needed for allergies or rhinitis.    [provider]  GEMTESA 75 MG TABS Take 1 tablet by mouth daily. 04/15/22   [provider]  glipiZIDE 2.5 MG TABS Take 2.5 mg by mouth 2 (two) times daily as needed (for glucoses above 180). 05/31/22   Laurey Morale, MD  glucose blood (ACCU-CHEK AVIVA PLUS) test strip USE TO CHECK BLOOD SUGARS 1 TO 2 TIMES DAILY. 01/25/22   Nafziger, Tommi Rumps, NP  levothyroxine (SYNTHROID) 75 MCG tablet TAKE 1 TABLET EVERY DAY BEFORE BREAKFAST 01/25/22   Nafziger, Tommi Rumps, NP  metFORMIN (GLUCOPHAGE) 500 MG tablet Take 2 tablets (1,000 mg total) by mouth 2 (two) times daily with a meal. 03/30/22   Nafziger, Tommi Rumps, NP  METOPROLOL SUCCINATE ER PO 25 mg. 08/11/20   [provider]  Multiple Vitamin (MULTIVITAMIN) tablet Take 1 tablet by mouth daily.    [provider]  Omega-3 Fatty Acids (FISH OIL) 1200 MG CAPS     [provider]  omeprazole (PRILOSEC) 40 MG capsule TAKE 1 CAPSULE EVERY DAY 01/25/22   Nafziger, Tommi Rumps, NP  rosuvastatin (CRESTOR) 10 MG tablet Take 1 tablet (10 mg total) by mouth daily. 01/25/22   Dorothyann Peng, NP  Semaglutide,0.25 or 0.5MG /DOS, 2 MG/3ML SOPN Inject 0.5 mg into the skin once a week. 04/27/22 07/26/22  Nafziger, Kandee Keen, NP  tadalafil (CIALIS) 5 MG tablet Take 1 tablet (5 mg total) by mouth daily. 12/01/21   Nafziger, Kandee Keen, NP  tamsulosin (FLOMAX) 0.4 MG CAPS capsule Take 0.4 mg by mouth daily after breakfast.    [provider]     Allergies    Bee venom, Flecainide, and Codeine   Review of Systems   Review of Systems Please see HPI for pertinent positives and negatives  Physical Exam BP (!) 116/104   Pulse 91   Temp 97.8 F (36.6 C) (Oral)   Resp (!) 25   Ht 5\' 8"  (1.727  m)   Wt 80.3 kg   SpO2 95%   BMI 26.91 kg/m   Physical Exam Vitals and nursing note reviewed.  Constitutional:      Appearance: Normal appearance.  HENT:     Head: Normocephalic and atraumatic.     Nose: Nose normal.     Mouth/Throat:     Mouth: Mucous membranes are moist.  Eyes:     Extraocular Movements: Extraocular movements intact.     Conjunctiva/sclera: Conjunctivae normal.  Cardiovascular:     Rate and Rhythm: Normal rate.  Pulmonary:     Effort: Pulmonary effort is normal.     Breath sounds: Normal breath sounds.  Abdominal:     General: Abdomen is flat.     Palpations: Abdomen is soft.     Tenderness: There is no abdominal tenderness.  Musculoskeletal:        General: No swelling. Normal range of motion.     Cervical back: Neck supple.  Skin:    General: Skin is warm and dry.  Neurological:     General: No focal deficit present.     Mental Status: He is alert.  Psychiatric:        Mood and Affect: Mood normal.     ED Results / Procedures / Treatments   EKG EKG Interpretation  Date/Time:  Thursday June 02 2022 23:49:19 EST Ventricular Rate:  86 PR Interval:  156 QRS Duration: 103 QT Interval:  346 QTC Calculation: 414 R Axis:   -11 Text Interpretation: Sinus rhythm Ventricular premature complex Anteroseptal infarct, old No significant change since last tracing Confirmed by [id number] [phone]) on 06/02/2022 11:52:27 PM  Procedures Procedures  Medications Ordered in the ED Medications  lactated ringers bolus 1,000 mL (1,000 mLs Intravenous New Bag/Given 06/02/22 2359)    Initial Impression and Plan  Patient here with vague symptoms of weakness, leg cramping, insomnia and low BP readings at home. Vitals here have been normal. Exam is reassuring. Labs done in triage show normal CBC, CMP with mild increase in LFTs, bilirubin is at baseline but AST/ALT and ALP are both increased from previous. Trop is neg. Patient's wife requesting a magnesium  level which will be added on. Will give a liter of IVF while waiting for that result. Overall, he is well appearing in no distress with no clear etiology for his symptoms but as of yet, no emergent cause identified.   ED Course   Clinical Course as of 06/03/22 0110  Fri Jun 03, 2022  0100 Magnesium is borderline low. Repeat Trop is neg.  [CS]  0109 Patient reports he is feeling better. No indication for admission. Recommend he continue with oral hydration at home. Log his BP and follow up with PCP next week if not improving.  [  CS]    Clinical Course User Index [CS] Truddie Hidden, MD     MDM Rules/Calculators/A&P Medical Decision Making Problems Addressed: Weakness: acute illness or injury  Amount and/or Complexity of Data Reviewed Labs: ordered. Decision-making details documented in ED Course. ECG/medicine tests: ordered and independent interpretation performed. Decision-making details documented in ED Course.  Risk Decision regarding hospitalization.    Final Clinical Impression(s) / ED Diagnoses Final diagnoses:  Weakness    Rx / DC Orders ED Discharge Orders     None        Truddie Hidden, MD 06/03/22 0110

## 2022-06-02 NOTE — ED Triage Notes (Signed)
Reports weakness, low blood pressure, high blood sugars, and high heart rate since 12/23. Patient was seen by PCP 2 days ago and was given a new medication for his blood sugar and his lisinopril has been discontinued. Patient also reports discoloration in urine.

## 2022-06-03 LAB — TROPONIN I (HIGH SENSITIVITY): Troponin I (High Sensitivity): 7 ng/L (ref <18)

## 2022-06-03 LAB — MAGNESIUM: Magnesium: 1.5 mg/dL — ABNORMAL LOW (ref 1.7–2.4)

## 2022-06-05 DIAGNOSIS — R531 Weakness: Secondary | ICD-10-CM | POA: Diagnosis not present

## 2022-06-05 DIAGNOSIS — R0602 Shortness of breath: Secondary | ICD-10-CM | POA: Diagnosis not present

## 2022-06-05 DIAGNOSIS — R945 Abnormal results of liver function studies: Secondary | ICD-10-CM | POA: Diagnosis not present

## 2022-06-05 DIAGNOSIS — R052 Subacute cough: Secondary | ICD-10-CM | POA: Diagnosis not present

## 2022-06-07 ENCOUNTER — Telehealth: Payer: Self-pay | Admitting: Adult Health

## 2022-06-07 NOTE — Telephone Encounter (Signed)
Wants to know if he should still take his ozempic, has been to a lot of docs lately, pls advise them

## 2022-06-07 NOTE — Telephone Encounter (Signed)
Please advise 

## 2022-06-07 NOTE — Telephone Encounter (Signed)
FYI Spoke to [place] pt wife (DPR) and she stated that the issue may be the ozempic. Pt has been in and out of medical centers due to fatigue, BP issues and high pulse rate and liver issues. Marjorie Smolder stated that one of the doctors advised that it may be coming from the [place]. Pt has bee scheduled for tomorrow.

## 2022-06-08 ENCOUNTER — Ambulatory Visit: Payer: Medicare PPO | Admitting: Adult Health

## 2022-06-08 ENCOUNTER — Encounter: Payer: Self-pay | Admitting: Adult Health

## 2022-06-08 VITALS — BP 94/62 | HR 94 | Temp 97.8°F | Ht 68.0 in | Wt 181.0 lb

## 2022-06-08 DIAGNOSIS — I10 Essential (primary) hypertension: Secondary | ICD-10-CM | POA: Diagnosis not present

## 2022-06-08 DIAGNOSIS — R7989 Other specified abnormal findings of blood chemistry: Secondary | ICD-10-CM | POA: Diagnosis not present

## 2022-06-08 DIAGNOSIS — R748 Abnormal levels of other serum enzymes: Secondary | ICD-10-CM

## 2022-06-08 DIAGNOSIS — R945 Abnormal results of liver function studies: Secondary | ICD-10-CM | POA: Diagnosis not present

## 2022-06-08 DIAGNOSIS — I252 Old myocardial infarction: Secondary | ICD-10-CM | POA: Diagnosis not present

## 2022-06-08 DIAGNOSIS — I25118 Atherosclerotic heart disease of native coronary artery with other forms of angina pectoris: Secondary | ICD-10-CM | POA: Diagnosis not present

## 2022-06-08 DIAGNOSIS — J431 Panlobular emphysema: Secondary | ICD-10-CM | POA: Diagnosis not present

## 2022-06-08 DIAGNOSIS — G4733 Obstructive sleep apnea (adult) (pediatric): Secondary | ICD-10-CM | POA: Diagnosis not present

## 2022-06-08 DIAGNOSIS — E119 Type 2 diabetes mellitus without complications: Secondary | ICD-10-CM | POA: Diagnosis not present

## 2022-06-08 DIAGNOSIS — J849 Interstitial pulmonary disease, unspecified: Secondary | ICD-10-CM | POA: Diagnosis not present

## 2022-06-08 DIAGNOSIS — R5383 Other fatigue: Secondary | ICD-10-CM

## 2022-06-08 DIAGNOSIS — E1129 Type 2 diabetes mellitus with other diabetic kidney complication: Secondary | ICD-10-CM | POA: Diagnosis not present

## 2022-06-08 DIAGNOSIS — K2100 Gastro-esophageal reflux disease with esophagitis, without bleeding: Secondary | ICD-10-CM | POA: Diagnosis not present

## 2022-06-08 DIAGNOSIS — I480 Paroxysmal atrial fibrillation: Secondary | ICD-10-CM | POA: Diagnosis not present

## 2022-06-08 LAB — COMPREHENSIVE METABOLIC PANEL
ALT: 77 U/L — ABNORMAL HIGH (ref 0–53)
AST: 25 U/L (ref 0–37)
Albumin: 4.2 g/dL (ref 3.5–5.2)
Alkaline Phosphatase: 218 U/L — ABNORMAL HIGH (ref 39–117)
BUN: 20 mg/dL (ref 6–23)
CO2: 27 mEq/L (ref 19–32)
Calcium: 10.8 mg/dL — ABNORMAL HIGH (ref 8.4–10.5)
Chloride: 98 mEq/L (ref 96–112)
Creatinine, Ser: 0.86 mg/dL (ref 0.40–1.50)
GFR: 89.25 mL/min (ref >60.00)
Glucose, Bld: 178 mg/dL — ABNORMAL HIGH (ref 70–99)
Potassium: 4.3 mEq/L (ref 3.5–5.1)
Sodium: 136 mEq/L (ref 135–145)
Total Bilirubin: 1 mg/dL (ref 0.2–1.2)
Total Protein: 7.8 g/dL (ref 6.0–8.3)

## 2022-06-08 LAB — TSH: TSH: 3.5 u[IU]/mL (ref 0.35–5.50)

## 2022-06-08 LAB — MAGNESIUM: Magnesium: 1.6 mg/dL (ref 1.5–2.5)

## 2022-06-08 MED ORDER — GLIPIZIDE 5 MG PO TABS: 5.0000 mg | ORAL_TABLET | Freq: Two times a day (BID) | ORAL | 0 refills | Status: DC
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
## 2022-06-09 ENCOUNTER — Ambulatory Visit: Payer: Medicare PPO | Admitting: Adult Health

## 2022-06-09 DIAGNOSIS — I480 Paroxysmal atrial fibrillation: Secondary | ICD-10-CM | POA: Diagnosis not present

## 2022-06-09 DIAGNOSIS — J849 Interstitial pulmonary disease, unspecified: Secondary | ICD-10-CM | POA: Diagnosis not present

## 2022-06-09 DIAGNOSIS — J449 Chronic obstructive pulmonary disease, unspecified: Secondary | ICD-10-CM | POA: Diagnosis not present

## 2022-06-09 NOTE — Telephone Encounter (Signed)
FYI

## 2022-06-10 ENCOUNTER — Telehealth: Payer: Self-pay | Admitting: Adult Health

## 2022-06-10 NOTE — Telephone Encounter (Signed)
Patient and Spouse Angelina notified of update  and verbalized understanding.

## 2022-06-10 NOTE — Telephone Encounter (Signed)
Pt wife is calling and would like blood work results

## 2022-06-12 DIAGNOSIS — R0602 Shortness of breath: Secondary | ICD-10-CM | POA: Diagnosis not present

## 2022-06-15 ENCOUNTER — Encounter: Payer: Self-pay | Admitting: Adult Health

## 2022-06-15 ENCOUNTER — Ambulatory Visit: Payer: Medicare PPO | Admitting: Adult Health

## 2022-06-15 ENCOUNTER — Other Ambulatory Visit: Payer: Self-pay | Admitting: Adult Health

## 2022-06-15 VITALS — BP 110/80 | HR 82 | Temp 97.6°F | Ht 68.0 in | Wt 178.0 lb

## 2022-06-15 DIAGNOSIS — E039 Hypothyroidism, unspecified: Secondary | ICD-10-CM | POA: Diagnosis not present

## 2022-06-15 DIAGNOSIS — E119 Type 2 diabetes mellitus without complications: Secondary | ICD-10-CM

## 2022-06-15 DIAGNOSIS — I10 Essential (primary) hypertension: Secondary | ICD-10-CM

## 2022-06-15 DIAGNOSIS — R748 Abnormal levels of other serum enzymes: Secondary | ICD-10-CM

## 2022-06-15 DIAGNOSIS — R5383 Other fatigue: Secondary | ICD-10-CM | POA: Diagnosis not present

## 2022-06-15 LAB — COMPREHENSIVE METABOLIC PANEL
ALT: 32 U/L (ref 0–53)
AST: 28 U/L (ref 0–37)
Albumin: 4.6 g/dL (ref 3.5–5.2)
Alkaline Phosphatase: 115 U/L (ref 39–117)
BUN: 22 mg/dL (ref 6–23)
CO2: 23 mEq/L (ref 19–32)
Calcium: 10.3 mg/dL (ref 8.4–10.5)
Chloride: 102 mEq/L (ref 96–112)
Creatinine, Ser: 0.86 mg/dL (ref 0.40–1.50)
GFR: 89.24 mL/min (ref >60.00)
Glucose, Bld: 86 mg/dL (ref 70–99)
Potassium: 4.7 mEq/L (ref 3.5–5.1)
Sodium: 139 mEq/L (ref 135–145)
Total Bilirubin: 1.5 mg/dL — ABNORMAL HIGH (ref 0.2–1.2)
Total Protein: 7.9 g/dL (ref 6.0–8.3)

## 2022-06-15 LAB — MAGNESIUM: Magnesium: 2 mg/dL (ref 1.5–2.5)

## 2022-06-15 LAB — TSH: TSH: 1.11 u[IU]/mL (ref 0.35–5.50)

## 2022-06-15 MED ORDER — FREESTYLE LIBRE 3 SENSOR MISC: 1.0000 | 3 refills | Status: AC
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
## 2022-06-16 ENCOUNTER — Ambulatory Visit
Admission: RE | Admit: 2022-06-16 | Discharge: 2022-06-16 | Disposition: A | Discharge status: Home / Self Care | Payer: Medicare PPO | Source: Ambulatory Visit | Attending: Family Medicine | Admitting: Family Medicine

## 2022-06-16 ENCOUNTER — Encounter: Payer: Self-pay | Admitting: Adult Health

## 2022-06-16 DIAGNOSIS — M79604 Pain in right leg: Secondary | ICD-10-CM

## 2022-06-16 DIAGNOSIS — R5383 Other fatigue: Secondary | ICD-10-CM | POA: Diagnosis not present

## 2022-06-16 DIAGNOSIS — E785 Hyperlipidemia, unspecified: Secondary | ICD-10-CM | POA: Diagnosis not present

## 2022-06-16 DIAGNOSIS — E119 Type 2 diabetes mellitus without complications: Secondary | ICD-10-CM

## 2022-06-16 DIAGNOSIS — I10 Essential (primary) hypertension: Secondary | ICD-10-CM | POA: Diagnosis not present

## 2022-06-16 NOTE — Telephone Encounter (Signed)
Spoke to pt spouse and advised that an Rx was sent to [facility] and if another sends to [facility] that it may cancel. Spouse stated that she just wanted to have some on hand bc sometimes the mail order take too long and pt runs out of medication I advised spouse to call us 2 weeks before he runs out to send to mail order. Spouse verbalized understanding.

## 2022-06-19 ENCOUNTER — Encounter: Payer: Self-pay | Admitting: Adult Health

## 2022-06-20 ENCOUNTER — Other Ambulatory Visit (HOSPITAL_COMMUNITY): Payer: Self-pay

## 2022-06-20 NOTE — Telephone Encounter (Signed)
Patient Advocate Encounter   Received notification from [facility] that prior authorization for [employer] is required.   PA submitted on 06/20/2022  [facility] [place] Status is pending

## 2022-06-21 ENCOUNTER — Ambulatory Visit: Payer: Medicare PPO | Admitting: Psychology

## 2022-06-21 NOTE — Telephone Encounter (Signed)
Noted  

## 2022-06-24 MED ORDER — GLIPIZIDE 5 MG PO TABS: 5.0000 mg | ORAL_TABLET | Freq: Two times a day (BID) | ORAL | 0 refills | Status: DC

## 2022-06-24 NOTE — Addendum Note (Signed)
Addended by: Gwenyth Ober R on: 06/24/2022 07:16 AM   Modules accepted: Orders

## 2022-07-05 ENCOUNTER — Ambulatory Visit: Payer: Medicare PPO | Admitting: Adult Health

## 2022-07-05 ENCOUNTER — Other Ambulatory Visit (HOSPITAL_COMMUNITY): Payer: Self-pay

## 2022-07-05 ENCOUNTER — Encounter: Payer: Self-pay | Admitting: Adult Health

## 2022-07-05 ENCOUNTER — Telehealth: Payer: Self-pay | Admitting: Adult Health

## 2022-07-05 VITALS — BP 102/78 | HR 75 | Temp 97.3°F | Wt 187.6 lb

## 2022-07-05 DIAGNOSIS — E119 Type 2 diabetes mellitus without complications: Secondary | ICD-10-CM | POA: Diagnosis not present

## 2022-07-05 DIAGNOSIS — I10 Essential (primary) hypertension: Secondary | ICD-10-CM | POA: Diagnosis not present

## 2022-07-05 LAB — POCT GLYCOSYLATED HEMOGLOBIN (HGB A1C): Hemoglobin A1C: 6.8 % — AB (ref 4.0–5.6)

## 2022-07-05 MED ORDER — GLIPIZIDE 10 MG PO TABS: 10.0000 mg | ORAL_TABLET | Freq: Every day | ORAL | 0 refills | Status: DC

## 2022-07-05 MED ORDER — GLIPIZIDE 5 MG PO TABS: 5.0000 mg | ORAL_TABLET | Freq: Every day | ORAL | 0 refills | Status: DC

## 2022-07-05 NOTE — Patient Instructions (Signed)
Your A1c was 6.8   I am going to increase your glipizide in the morning to 10 mg   Follow up in 3 months or sooner if needed

## 2022-07-05 NOTE — Telephone Encounter (Signed)
Telephone number to call for PA is  [phone]

## 2022-07-05 NOTE — Telephone Encounter (Signed)
Continuous Blood Gluc Sensor (FREESTYLE LIBRE 3 SENSOR) MISC  needs PA to [facility]

## 2022-07-05 NOTE — Telephone Encounter (Signed)
Can someone help with this please? 
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
## 2022-07-05 NOTE — Telephone Encounter (Signed)
Called [employer] to follow up, apparently anything submitted to [facility] that day got hung up in the system.  Finished clinical questions over phone with rep and estimated time of approval was within 60 hours.  PA ID: [id number]

## 2022-07-06 NOTE — Telephone Encounter (Signed)
Pharmacy Patient Advocate Encounter  Prior Authorization for [facility] 3 Sensor has been approved.    PA# [id number] Effective dates: 06/06/2022 through 06/06/2023

## 2022-07-06 NOTE — Telephone Encounter (Signed)
Noted  

## 2022-07-19 ENCOUNTER — Ambulatory Visit (INDEPENDENT_AMBULATORY_CARE_PROVIDER_SITE_OTHER): Payer: Medicare PPO | Admitting: Psychology

## 2022-07-19 DIAGNOSIS — F4323 Adjustment disorder with mixed anxiety and depressed mood: Secondary | ICD-10-CM | POA: Diagnosis not present

## 2022-07-19 NOTE — Progress Notes (Signed)
[facility] Counselor/Therapist Progress Note  Patient ID: [name], MRN: [id number]    Date: 07/19/22  Time Spent:  8:07 am - 9:07 pm:  60 Minutes  Treatment Type: Individual Therapy.  Reported Symptoms: depression and anxiety.   Mental Status Exam: Appearance:  Casual and Well Groomed     Behavior: Appropriate  Motor: Normal  Speech/Language:  Clear and Coherent  Affect: Appropriate  Mood: anxious  Thought process: normal  Thought content:   WNL  Sensory/Perceptual disturbances:   WNL  Orientation: oriented to person, place, time/date, and situation  Attention: Good  Concentration: Good  Memory: WNL  Fund of knowledge:  Good  Insight:   Good  Judgment:  Good  Impulse Control: Good   Risk Assessment: Danger to Self:  No Self-injurious Behavior: No Danger to Others: No Duty to Warn:no Physical Aggression / Violence:No  Access to Firearms a concern: No  Gang Involvement:No   Subjective:   [name] participated in the session, in person in the office with the therapist, and consented to treatment [name] reviewed the events of the past week.  He noted missing his previous appointment due to forgetfulness. He noted progress in managing his frustration overall. He noted frustration regarding his wife's electronic use and this interrupting conversation, leading to him not being heard, and noted hurt feelings. He noted his worry that his wife's difficulty modulating her electronics usage. Therapist provided psycho-education regarding Dopamine and the effects of stimulation on mood and functioning. Therapist provided psycho-education via email for review and reference. We discussed his anxiety regarding giving feedback to his wife regarding this as well as feedback in general. We processed this and reviewed communication skills and use of I messages. Therapist praised [name] for his effort during the session and scheduled a follow-up for continued  treatment.   Interventions: interpersonal & psycho-education   Diagnosis:   Adjustment disorder with mixed anxiety and depressed mood  Treatment Plan:  Client Abilities/Strengths [name] is intelligent, forthcoming, and motivated for change.   Support System: Family  Client Treatment Preferences Outpatient therapy.   Client Statement of Needs [name] would like to improving relationship with wife, improve self-confidence/self-image, processing past events (high need for approval), managing perspective, & process relationship with father.   Treatment Level Weekly  Symptoms  GAD: feeling nervous, worrying about different things, difficulty managing worry, irriability, feeling afraid something awufl might happen.    (Status: improved) Depression:  loss of interest, feeling down, disturbed sleep, poor appetite, feeling bad about self, poor self-esteem, difficulty concentrating,   (Status: maintained)  Goals:   [name] experiences symptoms of depression and anxiety.    Target Date: 10/12/22 Frequency: Weekly  Progress: 0 Modality: individual    Therapist will provide referrals for additional resources as appropriate.  Therapist will provide psycho-education regarding [name]'s diagnosis and corresponding treatment approaches and interventions. Licensed Clinical Social Worker, [place], LCSW will support the patient's ability to achieve the goals identified. will employ CBT, BA, Problem-solving, Solution Focused, Mindfulness,  coping skills, & other evidenced-based practices will be used to promote progress towards healthy functioning to help manage decrease symptoms associated with his diagnosis.   Reduce overall level, frequency, and intensity of the feelings of depression and anxiety evidenced by decreased overall symptoms from 6 to 7 days/week to 0 to 1 days/week per client report for at least 3 consecutive months. Verbally express understanding of the relationship between feelings of  depression, anxiety and their impact on thinking patterns and behaviors.  Verbalize an understanding of the role that distorted thinking plays in creating fears, excessive worry, and ruminations.  [name] participated in the creation of the treatment plan)    Buena Irish, LCSW

## 2022-08-02 DIAGNOSIS — M5116 Intervertebral disc disorders with radiculopathy, lumbar region: Secondary | ICD-10-CM | POA: Diagnosis not present

## 2022-08-02 DIAGNOSIS — M5416 Radiculopathy, lumbar region: Secondary | ICD-10-CM | POA: Diagnosis not present

## 2022-08-03 ENCOUNTER — Ambulatory Visit: Payer: Medicare PPO | Admitting: Adult Health

## 2022-08-03 ENCOUNTER — Encounter: Payer: Self-pay | Admitting: Adult Health

## 2022-08-03 VITALS — BP 110/72 | HR 88 | Temp 97.6°F | Ht 68.0 in | Wt 197.0 lb

## 2022-08-03 DIAGNOSIS — E119 Type 2 diabetes mellitus without complications: Secondary | ICD-10-CM

## 2022-08-03 DIAGNOSIS — I10 Essential (primary) hypertension: Secondary | ICD-10-CM

## 2022-08-03 DIAGNOSIS — Z96642 Presence of left artificial hip joint: Secondary | ICD-10-CM | POA: Diagnosis not present

## 2022-08-03 MED ORDER — CEPHALEXIN 500 MG PO CAPS: 2000.0000 mg | ORAL_CAPSULE | Freq: Once | ORAL | 0 refills | Status: AC

## 2022-08-03 MED ORDER — CEPHALEXIN 500 MG PO CAPS: 500.0000 mg | ORAL_CAPSULE | Freq: Once | ORAL | 0 refills | Status: DC | PRN
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
## 2022-08-09 ENCOUNTER — Telehealth: Payer: Self-pay | Admitting: Adult Health

## 2022-08-09 NOTE — Telephone Encounter (Signed)
Contacted [name] to schedule their annual wellness visit. Appointment made for 08/16/22.  [name] AWV direct phone # [phone]

## 2022-08-14 ENCOUNTER — Encounter: Payer: Self-pay | Admitting: Adult Health

## 2022-08-16 ENCOUNTER — Ambulatory Visit (INDEPENDENT_AMBULATORY_CARE_PROVIDER_SITE_OTHER): Payer: Medicare PPO | Admitting: Psychology

## 2022-08-16 ENCOUNTER — Telehealth: Payer: Medicare PPO | Admitting: Family Medicine

## 2022-08-16 ENCOUNTER — Other Ambulatory Visit: Payer: Self-pay | Admitting: Adult Health

## 2022-08-16 DIAGNOSIS — F4323 Adjustment disorder with mixed anxiety and depressed mood: Secondary | ICD-10-CM | POA: Diagnosis not present

## 2022-08-16 MED ORDER — EMPAGLIFLOZIN 10 MG PO TABS: 10.0000 mg | ORAL_TABLET | Freq: Every day | ORAL | 2 refills | Status: DC

## 2022-08-16 NOTE — Progress Notes (Signed)
[facility] Counselor/Therapist Progress Note  Patient ID: [name], MRN: [id number]    Date: 08/16/22  Time Spent:  9:05 am - 10:01 am:  56 Minutes  Treatment Type: Individual Therapy.  Reported Symptoms: depression and anxiety.   Mental Status Exam: Appearance:  Casual and Well Groomed     Behavior: Appropriate  Motor: Normal  Speech/Language:  Clear and Coherent  Affect: Appropriate  Mood: anxious  Thought process: normal  Thought content:   WNL  Sensory/Perceptual disturbances:   WNL  Orientation: oriented to person, place, time/date, and situation  Attention: Good  Concentration: Good  Memory: WNL  Fund of knowledge:  Good  Insight:   Good  Judgment:  Good  Impulse Control: Good   Risk Assessment: Danger to Self:  No Self-injurious Behavior: No Danger to Others: No Duty to Warn:no Physical Aggression / Violence:No  Access to Firearms a concern: No  Gang Involvement:No   Subjective:   [name] participated in the session, in person in the office with the therapist, and consented to treatment [name] reviewed the events of the past week. He noted "fighting a losing battle" in regards to his wife's technology usage. He noted some progress in planning time together but noted continued frustration regarding her usage and the interruption to their time together. We explored this during the session and his need for additional communication, attention during conversation, and closeness. We worked on identifying ways to communicate needs to positively and assertively. He noted his attempts to compromise with his wife regarding these issues. He noted  often feeling as though his suggestions are met with an immediate "no". We worked on processing this during the session. [name] noted work on communicating needs and proceeding with a plan if his wife does not want to engage such as planning a trip or purchasing a needed home device (vacuum). We  worked on assertiveness and identified boundaries. [name] was engaged and motivated and expressed commitment towards our goals. Therapist praised Doctor, general practice and provided supportive therapy. A follow-up was scheduled for continued treatment.   Interventions: interpersonal  Diagnosis:   Adjustment disorder with mixed anxiety and depressed mood  Treatment Plan:  Client Abilities/Strengths Bane is intelligent, forthcoming, and motivated for change.   Support System: Family  Client Treatment Preferences Outpatient therapy.   Client Statement of Needs [name] would like to improving relationship with wife, improve self-confidence/self-image, processing past events (high need for approval), managing perspective, & process relationship with father.   Treatment Level Weekly  Symptoms  GAD: feeling nervous, worrying about different things, difficulty managing worry, irriability, feeling afraid something awufl might happen.    (Status: maintained) Depression:  loss of interest, feeling down, disturbed sleep, poor appetite, feeling bad about self, poor self-esteem, difficulty concentrating,   (Status: maintained)  Goals:   [name] experiences symptoms of depression and anxiety.    Target Date: 10/12/22 Frequency: Weekly  Progress: 0 Modality: individual    Therapist will provide referrals for additional resources as appropriate.  Therapist will provide psycho-education regarding [name]'s diagnosis and corresponding treatment approaches and interventions. Licensed Clinical Social Worker, [place], LCSW will support the patient's ability to achieve the goals identified. will employ CBT, BA, Problem-solving, Solution Focused, Mindfulness,  coping skills, & other evidenced-based practices will be used to promote progress towards healthy functioning to help manage decrease symptoms associated with his diagnosis.   Reduce overall level, frequency, and intensity of the feelings of depression and anxiety  evidenced by decreased overall symptoms  from 6 to 7 days/week to 0 to 1 days/week per client report for at least 3 consecutive months. Verbally express understanding of the relationship between feelings of depression, anxiety and their impact on thinking patterns and behaviors. Verbalize an understanding of the role that distorted thinking plays in creating fears, excessive worry, and ruminations.  [name] participated in the creation of the treatment plan)    Buena Irish, LCSW

## 2022-08-24 ENCOUNTER — Encounter: Payer: Self-pay | Admitting: Adult Health

## 2022-08-25 ENCOUNTER — Telehealth (INDEPENDENT_AMBULATORY_CARE_PROVIDER_SITE_OTHER): Payer: Medicare PPO | Admitting: Family Medicine

## 2022-08-25 ENCOUNTER — Other Ambulatory Visit: Payer: Self-pay | Admitting: Adult Health

## 2022-08-25 DIAGNOSIS — Z0000 Encounter for general adult medical examination without abnormal findings: Secondary | ICD-10-CM | POA: Diagnosis not present

## 2022-08-25 DIAGNOSIS — E119 Type 2 diabetes mellitus without complications: Secondary | ICD-10-CM
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
## 2022-08-25 NOTE — Telephone Encounter (Signed)
Please advise 

## 2022-08-25 NOTE — Patient Instructions (Signed)
I really enjoyed getting to talk with you today! I am available on Tuesdays and Thursdays for virtual visits if you have any questions or concerns, or if I can be of any further assistance.   CHECKLIST FROM ANNUAL WELLNESS VISIT:  -Follow up (please call to schedule if not scheduled after visit):   -yearly for annual wellness visit with primary care office  Here is a list of your preventive care/health maintenance measures and the plan for each if any are due:  Health Maintenance  Topic Date Due   COVID-19 Vaccine (6 - 2023-24 season) 04/17/2022   Lung Cancer Screening  09/24/2022   OPHTHALMOLOGY EXAM  10/04/2022 (Originally 05/20/2022)   HEMOGLOBIN A1C  01/03/2023   Diabetic kidney evaluation - Urine ACR  01/20/2023   FOOT EXAM  01/20/2023   Diabetic kidney evaluation - eGFR measurement  06/16/2023   DTaP/Tdap/Td (3 - Td or Tdap) 08/20/2023   Medicare Annual Wellness (AWV)  08/25/2023   COLONOSCOPY (Pts 45-71yrs Insurance coverage will need to be confirmed)  01/07/2032   Pneumonia Vaccine 38+ Years old  Completed   INFLUENZA VACCINE  Completed   Hepatitis C Screening  Completed   Zoster Vaccines- Shingrix  Completed   HPV VACCINES  Aged Out    -See a dentist at least yearly  -Get your eyes checked and then per your eye specialist's recommendations  -Other issues addressed today:   -I have included below further information regarding a healthy whole foods based diet, physical activity guidelines for adults, stress management and opportunities for social connections. I hope you find this information useful.   -----------------------------------------------------------------------------------------------------------------------------------------------------------------------------------------------------------------------------------------------------------  NUTRITION: -eat real food: lots of colorful vegetables (half the plate) and fruits -5-7 servings of vegetables and  fruits per day (fresh or steamed is best), exp. 2 servings of vegetables with lunch and dinner and 2 servings of fruit per day. Berries and greens such as kale and collards are great choices.  -consume on a regular basis: whole grains (make sure first ingredient on label contains the word "whole"), fresh fruits, fish, nuts, seeds, healthy oils (such as olive oil, avocado oil, grape seed oil) -may eat small amounts of dairy and lean meat on occasion, but avoid processed meats such as ham, bacon, lunch meat, etc. -drink water -try to avoid fast food and pre-packaged foods, processed meat -most experts advise limiting sodium to < 2300mg  per day, should limit further is any chronic conditions such as high blood pressure, heart disease, diabetes, etc. The American Heart Association advised that < 1500mg  is is ideal -try to avoid foods that contain any ingredients with names you do not recognize  -try to avoid sugar/sweets (except for the natural sugar that occurs in fresh fruit) -try to avoid sweet drinks -try to avoid white rice, white bread, pasta (unless whole grain), white or yellow potatoes  EXERCISE GUIDELINES FOR ADULTS: -if you wish to increase your physical activity, do so gradually and with the approval of your doctor -STOP and seek medical care immediately if you have any chest pain, chest discomfort or trouble breathing when starting or increasing exercise  -move and stretch your body, legs, feet and arms when sitting for long periods -Physical activity guidelines for optimal health in adults: -least 150 minutes per week of aerobic exercise (can talk, but not sing) once approved by your doctor, 20-30 minutes of sustained activity or two 10 minute episodes of sustained activity every day.  -resistance training at least 2 days per week if approved by  your doctor -balance exercises 3+ days per week:   Stand somewhere where you have something sturdy to hold onto if you lose balance.    1) lift  up on toes, start with 5x per day and work up to 20x   2) stand and lift on leg straight out to the side so that foot is a few inches of the floor, start with 5x each side and work up to 20x each side   3) stand on one foot, start with 5 seconds each side and work up to 20 seconds on each side  If you need ideas or help with getting more active:  -Silver sneakers https://tools.silversneakers.com  -Walk with a Doc: http://stephens-thompson.biz/  -try to include resistance (weight lifting/strength building) and balance exercises twice per week: or the following link for ideas: ChessContest.fr  UpdateClothing.com.cy  STRESS MANAGEMENT: -can try meditating, or just sitting quietly with deep breathing while intentionally relaxing all parts of your body for 5 minutes daily -if you need further help with stress, anxiety or depression please follow up with your primary doctor or contact the wonderful folks at [place]: [place]: -options in [place] if you wish to engage in more social and exercise related activities:  -Silver sneakers https://tools.silversneakers.com  -Walk with a Doc: http://stephens-thompson.biz/  -Check out the [place] 50+ section on the [place] [employer] (hiking clubs, book clubs, cards and games, chess, exercise classes, aquatic classes and much more) - see the website for details: https://www.[place]-.gov/departments/parks-recreation/active-adults50  -YouTube has lots of exercise videos for different ages and abilities as well  -[place] (a variety of indoor and outdoor inperson activities for adults). [phone]. [address]  -Virtual Online Classes (a variety of topics): see seniorplanet.org or call (782)650-3701  -consider volunteering at a school, hospice center, church, senior  center or elsewhere

## 2022-09-01 ENCOUNTER — Ambulatory Visit: Payer: Medicare PPO | Admitting: Adult Health

## 2022-09-01 ENCOUNTER — Encounter: Payer: Self-pay | Admitting: Adult Health

## 2022-09-01 VITALS — BP 110/80 | HR 88 | Temp 97.8°F | Ht 68.0 in | Wt 175.0 lb

## 2022-09-01 DIAGNOSIS — E119 Type 2 diabetes mellitus without complications: Secondary | ICD-10-CM

## 2022-09-01 LAB — BASIC METABOLIC PANEL
BUN: 20 mg/dL (ref 6–23)
CO2: 15 mEq/L — ABNORMAL LOW (ref 19–32)
Calcium: 9.6 mg/dL (ref 8.4–10.5)
Chloride: 104 mEq/L (ref 96–112)
Creatinine, Ser: 0.91 mg/dL (ref 0.40–1.50)
GFR: 86.73 mL/min (ref >60.00)
Glucose, Bld: 109 mg/dL — ABNORMAL HIGH (ref 70–99)
Potassium: 4.3 mEq/L (ref 3.5–5.1)
Sodium: 133 mEq/L — ABNORMAL LOW (ref 135–145)

## 2022-09-01 NOTE — Addendum Note (Signed)
Addended by: Colin Rhein A on: 09/01/2022 02:09 PM   Modules accepted: Orders
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
## 2022-09-06 ENCOUNTER — Encounter: Payer: Medicare PPO | Attending: Adult Health | Admitting: Dietician

## 2022-09-06 ENCOUNTER — Encounter: Payer: Self-pay | Admitting: Dietician

## 2022-09-06 VITALS — Ht 68.0 in | Wt 178.0 lb

## 2022-09-06 DIAGNOSIS — E118 Type 2 diabetes mellitus with unspecified complications: Secondary | ICD-10-CM | POA: Diagnosis not present

## 2022-09-06 NOTE — Progress Notes (Signed)
Diabetes Self-Management Education  Visit Type: First/Initial  Appt. Start Time: 1620 Appt. End Time: [id number]  09/06/2022  [name], identified by name and date of birth, is a 67 y.o. male with a diagnosis of Diabetes: Type 2.   ASSESSMENT Patient is here today alone.  He last met with this RD in 2021.  His blood glucose increases for about 4 weeks following steroid injections. He is getting tired of tracking his intake.   He would like to keep his diabetes under control, learn how to manage his glucose after receiving a steroid shot, archive and maintain an appropriate weight, obtain exercise and diet recommendations.  Referral reason:  Type 2 Diabetes  History includes:  Type 2 Diabetes, arthritis - receives steroid shots every 12 weeks, GERD, HTN, hypothyroid, hearing loss, Gilbert's syndrome Medications include:  Jardiance, Metformin, (allergic to Ozempic), magnesium, MVI, omega 3, vitamin D Labs noted to include:  6.8% 07/05/2022 decreased from 8.4% 03/30/2022 CGM:  Libre 3   CGM Results from download: 09/06/2022  % Time CGM active:   95 %   (Goal >70%)  Average glucose:   157 mg/dL for 14 days  Glucose management indicator:   7.1 %  Time in range (70-180 mg/dL):   80 %   (Goal >70%)  Time High (181-250 mg/dL):   16 %   (Goal < 25%)  Time Very High (>250 mg/dL):    4 %   (Goal < 5%)  Time Low (54-69 mg/dL):   0 %   (Goal <4%)  Time Very Low (<54 mg/dL):   0 %   (Goal <1%)   Weight hx: 68" 178 lbs  219 lbs 09/2021. Purposefully lost by limiting his diet to <1200 calories per day.  He is journaling his intake and keeping track of his protein, carbohydrate and calorie intake.  He does have one day per week that he does not track. 245 lbs highest adult weight 163 lbs Goal weight per patient  Patient lives with his wife.  He does the shopping and cooking.  He is retired Licensed conveyancer. Exercises 5-6 days per week for 1-2 hours (disc golf, water aerobics, weights, pickle  ball, volunteers at the food bank.)  Can't walk without significant pain unless he gets the quarterly steroid injection. Vegetarian for about 6 months about 1 year ago as meat became very unappealing. Doesn't eat until he is hungry.  Height 5\' 8"  (1.727 m), weight 178 lb (80.7 kg). Body mass index is 27.06 kg/m.   Diabetes Self-Management Education - 09/06/22 1610       Visit Information   Visit Type First/Initial      Initial Visit   Diabetes Type Type 2    Date Diagnosed 2014    Are you currently following a meal plan? Yes    What type of meal plan do you follow? 1200 high protein diet      Health Coping   How would you rate your overall health? Good      Psychosocial Assessment   Patient Belief/Attitude about Diabetes Motivated to manage diabetes    What is the hardest part about your diabetes right now, causing you the most concern, or is the most worrisome to you about your diabetes?   Getting support / problem solving   blood glucose control with steroids   Self-care barriers None    Self-management support Doctor's office;Family    Other persons present Patient    Patient Concerns Nutrition/Meal planning  Special Needs None    Preferred Learning Style No preference indicated    Learning Readiness Ready    How often do you need to have someone help you when you read instructions, pamphlets, or other written materials from your doctor or pharmacy? 1 - Never    What is the last grade level you completed in school? BA +      Pre-Education Assessment   Patient understands the diabetes disease and treatment process. Needs Review    Patient understands incorporating nutritional management into lifestyle. Needs Review    Patient undertands incorporating physical activity into lifestyle. Needs Review    Patient understands using medications safely. Needs Review    Patient understands monitoring blood glucose, interpreting and using results Needs Review    Patient understands  prevention, detection, and treatment of acute complications. Needs Review    Patient understands prevention, detection, and treatment of chronic complications. Needs Review    Patient understands how to develop strategies to address psychosocial issues. Needs Review    Patient understands how to develop strategies to promote health/change behavior. Needs Review      Complications   Last HgB A1C per patient/outside source 6.8 %   07/05/2022 decreased from 8.4% 03/30/2022   How often do you check your blood sugar? > 4 times/day    Fasting Blood glucose range (mg/dL) 70-129;130-179;180-200    Postprandial Blood glucose range (mg/dL) >200    Number of hypoglycemic episodes per month 0      Dietary Intake   Lunch protein chips, protein packs (ham, [place], chicken) OR ham and cheese on tortilla    Dinner [place] sandwich, lite mayo, carrots, pinto beans,    Snack (evening) 100 cal pack of cookies    Beverage(s) selzer water, decaf coffee with splenda and powdered flavored creamer, rare unsweetened tea, occasional diet soda, occasional alcohol      Activity / Exercise   Activity / Exercise Type Light (walking / raking leaves);Moderate (swimming / aerobic walking)    How many days per week do you exercise? 5      Patient Education   Previous Diabetes Education Yes (please comment)   2021   Disease Pathophysiology Definition of diabetes, type 1 and 2, and the diagnosis of diabetes    Healthy Eating Role of diet in the treatment of diabetes and the relationship between the three main macronutrients and blood glucose level;Plate Method;Meal options for control of blood glucose level and chronic complications.;Effects of alcohol on blood glucose and safety factors with consumption of alcohol.;Food label reading, portion sizes and measuring food.   using the calorie king app   Being Active Role of exercise on diabetes management, blood pressure control and cardiac health.;Helped patient identify  appropriate exercises in relation to his/her diabetes, diabetes complications and other health issue.    Medications Reviewed patients medication for diabetes, action, purpose, timing of dose and side effects.    Monitoring Taught/evaluated CGM (comment)    Diabetes Stress and Support Identified and addressed patients feelings and concerns about diabetes;Worked with patient to identify barriers to care and solutions      Individualized Goals (developed by patient)   Nutrition General guidelines for healthy choices and portions discussed    Physical Activity Exercise 5-7 days per week;60 minutes per day    Medications take my medication as prescribed    Monitoring  Consistenly use CGM    Problem Solving Eating Pattern    Reducing Risk examine blood glucose patterns  Patient Self-Evaluation of Goals - Patient rates self as meeting previously set goals (% of time)   Nutrition >75% (most of the time)    Physical Activity >75% (most of the time)    Medications >75% (most of the time)    Monitoring >75% (most of the time)    Problem Solving and behavior change strategies  >75% (most of the time)    Reducing Risk (treating acute and chronic complications) [id number] (most of the time)    Health Coping >75% (most of the time)      Post-Education Assessment   Patient understands the diabetes disease and treatment process. Demonstrates understanding / competency    Patient understands incorporating nutritional management into lifestyle. Demonstrates understanding / competency    Patient undertands incorporating physical activity into lifestyle. Demonstrates understanding / competency    Patient understands using medications safely. Demonstrates understanding / competency    Patient understands monitoring blood glucose, interpreting and using results Demonstrates understanding / competency    Patient understands prevention, detection, and treatment of acute complications. Demonstrates understanding /  competency    Patient understands prevention, detection, and treatment of chronic complications. Demonstrates understanding / competency    Patient understands how to develop strategies to address psychosocial issues. Demonstrates understanding / competency    Patient understands how to develop strategies to promote health/change behavior. Demonstrates understanding / competency      Outcomes   Expected Outcomes Demonstrated interest in learning. Expect positive outcomes    Future DMSE PRN    Program Status Completed      Subsequent Visit   Since your last visit have you continued or begun to take your medications as prescribed? Yes    Since your last visit have you experienced any weight changes? Loss             Individualized Plan for Diabetes Self-Management Training:   Learning Objective:  Patient will have a greater understanding of diabetes self-management. Patient education plan is to attend individual and/or group sessions per assessed needs and concerns.   Plan:   Patient Instructions  Calorie [employer] Pace app  Continue to stay active at least 30-60 minutes most days.  Consider walking 10 minutes after each meal (particularly when your sugar is higher due to steroids) to lower the spike  After a steroid injection you may need to lower your carbohydrate intake a little and maintain on plenty of non-starchy vegetables and lean protein.  Walk 10 minutes after a meal.    Add greens to your wraps  Half your plate should be vegetables.  Expected Outcomes:  Demonstrated interest in learning. Expect positive outcomes  Education material provided: Meal plan card and Snack sheet, Diabetes Resource  If problems or questions, patient to contact team via:  Phone  Future DSME appointment: PRN

## 2022-09-06 NOTE — Patient Instructions (Signed)
Calorie King app  Continue to stay active at least 30-60 minutes most days.  Consider walking 10 minutes after each meal (particularly when your sugar is higher due to steroids) to lower the spike  After a steroid injection you may need to lower your carbohydrate intake a little and maintain on plenty of non-starchy vegetables and lean protein.  Walk 10 minutes after a meal.    Add greens to your wraps  Half your plate should be vegetables.

## 2022-09-07 DIAGNOSIS — G4733 Obstructive sleep apnea (adult) (pediatric): Secondary | ICD-10-CM | POA: Diagnosis not present

## 2022-09-14 ENCOUNTER — Other Ambulatory Visit: Payer: Self-pay

## 2022-09-14 DIAGNOSIS — E118 Type 2 diabetes mellitus with unspecified complications: Secondary | ICD-10-CM

## 2022-09-14 DIAGNOSIS — I10 Essential (primary) hypertension: Secondary | ICD-10-CM

## 2022-09-16 ENCOUNTER — Telehealth: Payer: Self-pay

## 2022-09-16 NOTE — Progress Notes (Signed)
   Care Guide Note  09/16/2022 Name: Alicia Salasar MRN: [id number] DOB: 02-Feb-1956  Referred by: Shirline Frees, NP Reason for referral : Care Coordination (Outreach to schedule referral with pharm d )   Bryar Conway is a 67 y.o. year old male who is a primary care patient of Shirline Frees, NP. Kye Chandrasekaran Knecht was referred to the pharmacist for assistance related to HTN and HLD.    Successful contact was made with the patient to discuss pharmacy services including being ready for the pharmacist to call at least 5 minutes before the scheduled appointment time, to have medication bottles and any blood sugar or blood pressure readings ready for review. The patient agreed to meet with the pharmacist via with the pharmacist via telephone visit on (date/time).  09/27/2022  Penne Lash, RMA Care Guide [facility]  [place], [place] [address] Direct Dial: [phone] [email or web address]

## 2022-09-20 ENCOUNTER — Ambulatory Visit: Payer: Medicare PPO | Admitting: Psychology

## 2022-09-26 ENCOUNTER — Ambulatory Visit: Payer: Medicare PPO | Admitting: Psychology

## 2022-09-27 ENCOUNTER — Other Ambulatory Visit: Payer: Medicare PPO

## 2022-09-27 NOTE — Progress Notes (Signed)
09/27/2022 Name: [name] MRN: [id number] DOB: 01-20-1956  Chief Complaint  Patient presents with   Medication Management   [name] is Coffey 67 y.o. year old male who presented for Coffey telephone visit.   They were referred to the pharmacist by their PCP for assistance in managing diabetes.   Patient is participating in [place] Managed Medicaid Plan:  No  Subjective: Telephone visit to discuss medication management specific to diabetes control in regard to referral placed by PCP. Care Team: Primary Care Provider: Shirline Frees, Coffey ; Next Scheduled Visit: 10/05/22  Medication Access/Adherence Current Pharmacy:  [facility] - [place], [place] - [address] [place] [address] Phone: [phone] Fax: [phone]  [facility] - [place], [place] - [address] [place] [address] Phone: [phone] Fax: [phone]  Patient reports affordability concerns with their medications: No  Patient reports access/transportation concerns to their pharmacy: No  Patient reports adherence concerns with their medications:  No    Diabetes: Current medications: Jardiance  daily, metformin  BID -Medications tried in the past: Ozempic-n/v/d and increased LFTs, glipizide- hypoglycemia -Current glucose readings: Fasting can range 117-140, and post-prandial can be >200 at times -Using [place] 3, testing continuously -Will compare with Accu Chek guide on occasion for accuracy, especially after reading/hearing of inaccuracy with [place] devices.  States his readings have always been accurate in comparison to finger-prick. -Patient denies hypoglycemic s/sx including dizziness, shakiness, sweating. This has improved since glipizide was stopped. -Patient denies hyperglycemic symptoms including polyuria, polydipsia, polyphagia, nocturia, neuropathy, blurred vision. -Endorses tolerating current medication  regimen well but does endorse increased urination with Jardiance.  Patient states this is Coffey side effect he can tolerate for benefit to glycemic control. -Current meal patterns: patient endorses going 10+ hours without eating due to fasting numbers being in 140's and then going even higher when eating  Hypertension: Current medications: metoprolol succ er  daily -Medications previously tried: lisinopril recently stopped due to hypotension  Hyperlipidemia/ASCVD Risk Reduction Current lipid lowering medications: rosuvastatin  daily  Objective: Lab Results  Component Value Date   HGBA1C 6.8 (Coffey) 07/05/2022   Lab Results  Component Value Date   CREATININE 0.91 09/01/2022   BUN 20 09/01/2022   NA 133 (L) 09/01/2022   K 4.3 09/01/2022   CL 104 09/01/2022   CO2 15 (L) 09/01/2022   Lab Results  Component Value Date   CHOL 128 01/19/2022   HDL 47.60 01/19/2022   LDLCALC 49 01/19/2022   LDLDIRECT 134.2 03/27/2012   TRIG 154.0 (H) 01/19/2022   CHOLHDL 3 01/19/2022   Medications Reviewed Today     Reviewed by [name], RPH (Pharmacist) on 09/27/22 at 1421  Med List Status: <None>   Medication Order Taking? Sig Documenting Provider Last Dose Status Informant  Accu-Chek Softclix Lancets lancets [id number] Yes USE TO CHECK BLOOD SUGARS 1 TO 2 TIMES DAILY. Coffey, John Keen, Coffey Taking Active   aspirin EC 81 MG tablet [id number] No Take 81 mg by mouth daily. Swallow whole.  Patient not taking: Reported on 09/27/2022   [provider] Not Taking Active            Med Note [name], [name]   Tue Sep 27, 2022  2:19 PM) Was told not to  Blood Glucose Calibration (ACCU-CHEK AVIVA) SOLN [id number] Yes Use with machine. Coffey, John Keen, Coffey Taking Active   cephALEXin (KEFLEX) 500 MG capsule [id number] Yes  Take 500 mg by mouth 4 (four) times daily. Prior to dental work [provider] Taking Active   CHELATED MAGNESIUM PO [id number] Yes 200 mg 2 (two) times daily. 1 TAB IN  THE Coffey.M. one tab in the P.M. [provider] Taking Active   Cholecalciferol (VITAMIN D3) 2000 units capsule [id number] Yes Take 2,000 Units by mouth daily. [provider] Taking Active Multiple Informants  clobetasol ointment (TEMOVATE) 0.05 % [id number] No Apply topically 2 (two) times daily as needed.  Patient not taking: Reported on 09/27/2022   [name] Not Taking Active            Med Note [name], Chane Cowden Coffey   Tue Sep 27, 2022  2:14 PM) Has not had eczema flare in 2+ years  Continuous Glucose Sensor (FREESTYLE LIBRE 3 SENSOR) [place] [id number] Yes 1 each by Does not apply route continuous. [provider] Taking Active   DENTA 5000 PLUS 1.1 % CREA dental cream [id number] Yes See admin instructions. [provider] Taking Active            Med Note [name], [name]   Tue Sep 27, 2022  2:15 PM) Once Coffey day in the morning  ELIQUIS 5 MG TABS tablet [id number] Yes Take 5 mg by mouth 2 (two) times daily. [provider] Taking Active   empagliflozin (JARDIANCE) 10 MG TABS tablet [id number] Yes Take 1 tablet (10 mg total) by mouth daily before breakfast. [name] Taking Active   EPINEPHrine (EPIPEN 2-PAK) 0.3 mg/0.3 mL IJ SOAJ injection [id number] Yes USE AS DIRECTED Coffey, John Keen, Coffey Taking Active            Med Note [name], [name]   Tue Sep 27, 2022  2:15 PM) Has one on hand  fluticasone (FLONASE) 50 MCG/ACT nasal spray [id number] Yes Place 2 sprays into both nostrils daily as needed for allergies or rhinitis. [provider] Taking Active            Med Note [name], [name] Coffey   Tue Sep 27, 2022  2:16 PM) 1 spray each nostril in the evening  GEMTESA 75 MG TABS [id number] Yes Take 1 tablet by mouth daily. [provider] Taking Active   glucose blood (ACCU-CHEK AVIVA PLUS) test strip [id number] Yes USE TO CHECK BLOOD SUGARS 1 TO 2 TIMES DAILY. Coffey, John Keen, Coffey Taking Active   levothyroxine (SYNTHROID) 75 MCG tablet  [id number] Yes TAKE 1 TABLET EVERY DAY BEFORE BREAKFAST Coffey, John Keen, Coffey Taking Active   metFORMIN (GLUCOPHAGE) 500 MG tablet [id number] Yes Take 2 tablets (1,000 mg total) by mouth 2 (two) times daily with Coffey meal. [name] Taking Active   METOPROLOL SUCCINATE ER PO [id number] Yes 25 mg. [provider] Taking Active            Med Note [name], Kadian Barcellos Coffey   Tue Sep 27, 2022  2:18 PM) Once daily   Multiple Vitamin (MULTIVITAMIN) tablet [id number] Yes Take 1 tablet by mouth daily. [provider] Taking Active Multiple Informants  Omega-3 Fatty Acids (FISH OIL) 1200 MG CAPS [id number] Yes  [provider] Taking Active            Med Note [name], Deardra Hinkley Coffey   Tue Sep 27, 2022  2:16 PM) Once daily   omeprazole (PRILOSEC) 40 MG capsule [id number] Yes TAKE 1 CAPSULE EVERY DAY Coffey, Cory, Coffey Taking Active   rosuvastatin (CRESTOR) 10 MG tablet [id number] Yes Take 1 tablet (10  mg total) by mouth daily. Coffey, John Keen, Coffey Taking Active   tadalafil (CIALIS) 20 MG tablet [id number] Yes Take by mouth. [provider] Taking Active            Med Note [name], Raymonda Pell Coffey   Tue Sep 27, 2022  2:14 PM) prn  tamsulosin (FLOMAX) 0.4 MG CAPS capsule [id number] Yes Take 0.4 mg by mouth daily after breakfast. [provider] Taking Active            Assessment/Plan:   Diabetes: - Currently controlled at <7 - Reviewed goal A1c, goal fasting, and goal 2 hour post prandial glucose - Reviewed dietary modifications including: smaller, more frequent "meals" 3-5 hours apart; should not exceed current total daily caloric intake and should be higher in protein, lower in carbs -Recommend follow up A1c reading at 5/1 PCP visit -Increased urination from Jardiance could subside with time, but I would not recommend increasing to 25mg  once this has improved -Providing support number for [facility], so patient can contact if there is ongoing issue with reading availability past the  initial hour after sensor placement  Hypertension: - Currently controlled - Recommend to continue current regimen and regular follow-up  Hyperlipidemia/ASCVD Risk Reduction: - Currently controlled.  - Continue current regimen and regular follow-up   Follow Up Plan: Nothing scheduled but will follow-up on A1c reading after PCP appointment 5/1 and check in with patient PRN per PCP.  Patient also has my direct number if any medication needs arise.  [name], PharmD, DPLA

## 2022-10-04 ENCOUNTER — Ambulatory Visit: Payer: Medicare PPO | Admitting: Adult Health

## 2022-10-05 ENCOUNTER — Encounter: Payer: Self-pay | Admitting: Adult Health

## 2022-10-05 ENCOUNTER — Ambulatory Visit: Payer: Medicare PPO | Admitting: Adult Health

## 2022-10-05 ENCOUNTER — Ambulatory Visit: Payer: Medicare PPO | Admitting: Psychology

## 2022-10-05 VITALS — BP 108/60 | HR 95 | Temp 98.0°F | Ht 68.0 in | Wt 177.0 lb

## 2022-10-05 DIAGNOSIS — Z7984 Long term (current) use of oral hypoglycemic drugs: Secondary | ICD-10-CM | POA: Diagnosis not present

## 2022-10-05 DIAGNOSIS — I10 Essential (primary) hypertension: Secondary | ICD-10-CM

## 2022-10-05 DIAGNOSIS — E118 Type 2 diabetes mellitus with unspecified complications: Secondary | ICD-10-CM | POA: Diagnosis not present

## 2022-10-05 LAB — BASIC METABOLIC PANEL
BUN: 18 mg/dL (ref 6–23)
CO2: 26 mEq/L (ref 19–32)
Calcium: 9.7 mg/dL (ref 8.4–10.5)
Chloride: 104 mEq/L (ref 96–112)
Creatinine, Ser: 0.86 mg/dL (ref 0.40–1.50)
GFR: 89.05 mL/min (ref >60.00)
Glucose, Bld: 146 mg/dL — ABNORMAL HIGH (ref 70–99)
Potassium: 4.1 mEq/L (ref 3.5–5.1)
Sodium: 140 mEq/L (ref 135–145)

## 2022-10-05 LAB — MICROALBUMIN / CREATININE URINE RATIO
Creatinine,U: 49.8 mg/dL
Microalb Creat Ratio: 2.6 mg/g (ref 0.0–30.0)
Microalb, Ur: 1.3 mg/dL (ref 0.0–1.9)

## 2022-10-05 LAB — POCT GLYCOSYLATED HEMOGLOBIN (HGB A1C): Hemoglobin A1C: 7 % — AB (ref 4.0–5.6)

## 2022-10-05 MED ORDER — EMPAGLIFLOZIN 10 MG PO TABS: 10.0000 mg | ORAL_TABLET | Freq: Every day | ORAL | 1 refills | Status: DC

## 2022-10-05 NOTE — Patient Instructions (Signed)
Health Maintenance Due  Topic Date Due   COVID-19 Vaccine (6 - 2023-24 season) 04/17/2022   OPHTHALMOLOGY EXAM  05/20/2022   Lung Cancer Screening  09/24/2022      Row Labels 09/06/2022    3:40 PM 08/25/2022   12:09 PM 07/05/2022    1:55 PM  Depression screen PHQ 2/9   Section Header. No data exists in this row.     Decreased Interest   0 0 0  Down, Depressed, Hopeless   0 0 0  PHQ - 2 Score   0 0 0  Altered sleeping    0 0  Tired, decreased energy    0 0  Change in appetite    0 0  Feeling bad or failure about yourself     0 0  Trouble concentrating    0 0  Moving slowly or fidgety/restless    0 0  Suicidal thoughts    0 0  PHQ-9 Score    0 0  Difficult doing work/chores    Not difficult at all Somewhat difficult
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
## 2022-10-18 ENCOUNTER — Ambulatory Visit (INDEPENDENT_AMBULATORY_CARE_PROVIDER_SITE_OTHER): Payer: Medicare PPO | Admitting: Psychology

## 2022-10-18 DIAGNOSIS — F4323 Adjustment disorder with mixed anxiety and depressed mood: Secondary | ICD-10-CM | POA: Diagnosis not present
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
## 2022-11-29 DIAGNOSIS — M5416 Radiculopathy, lumbar region: Secondary | ICD-10-CM | POA: Diagnosis not present

## 2022-11-29 DIAGNOSIS — M5116 Intervertebral disc disorders with radiculopathy, lumbar region: Secondary | ICD-10-CM | POA: Diagnosis not present

## 2022-12-19 DIAGNOSIS — G4733 Obstructive sleep apnea (adult) (pediatric): Secondary | ICD-10-CM | POA: Diagnosis not present

## 2022-12-28 DIAGNOSIS — I480 Paroxysmal atrial fibrillation: Secondary | ICD-10-CM | POA: Diagnosis not present

## 2022-12-28 DIAGNOSIS — G4733 Obstructive sleep apnea (adult) (pediatric): Secondary | ICD-10-CM | POA: Diagnosis not present

## 2022-12-28 DIAGNOSIS — J449 Chronic obstructive pulmonary disease, unspecified: Secondary | ICD-10-CM | POA: Diagnosis not present

## 2022-12-28 DIAGNOSIS — J849 Interstitial pulmonary disease, unspecified: Secondary | ICD-10-CM | POA: Diagnosis not present

## 2023-01-03 ENCOUNTER — Ambulatory Visit (INDEPENDENT_AMBULATORY_CARE_PROVIDER_SITE_OTHER): Payer: Medicare PPO | Admitting: Psychology

## 2023-01-03 DIAGNOSIS — F4323 Adjustment disorder with mixed anxiety and depressed mood: Secondary | ICD-10-CM

## 2023-01-03 NOTE — Progress Notes (Signed)
[facility] Counselor/Therapist Progress Note  Patient ID: [name], MRN: [id number]    Date: 01/03/23  Time Spent: 10:11  am - 11:07 am : 56 Minutes  Treatment Type: Individual Therapy.  Reported Symptoms: depression, anxiety, and interpersonal stressors.   Mental Status Exam: Appearance:  Casual     Behavior: Appropriate  Motor: Normal  Speech/Language:  Clear and Coherent and Normal Rate  Affect: Congruent  Mood: normal  Thought process: normal  Thought content:   WNL  Sensory/Perceptual disturbances:   WNL  Orientation: oriented to person, place, time/date, and situation  Attention: Good  Concentration: Good  Memory: WNL  Fund of knowledge:  Good  Insight:   Good  Judgment:  Good  Impulse Control: Good   Risk Assessment: Danger to Self:  No Self-injurious Behavior: No Danger to Others: No Duty to Warn:no Physical Aggression / Violence:No  Access to Firearms a concern: No  Gang Involvement:No   Subjective:   [name] participated in the session, in person in the office with the therapist, and consented to treatment [name] reviewed the events of the past week. He noted things going well since our last session. We explored this during the session. He did note being more forgetful and losing things as he ages.     We reviewed numerous treatment approaches including CBT, BA, Problem Solving, and Solution focused therapy. Psych-education regarding the [name]'s diagnosis of Adjustment disorder with mixed anxiety and depressed mood was provided during the session. We discussed [name]'s goals treatment goals which include improve his focus and concentration, work on being less forgetful, being more mindful, develop comfort with self and personality (acceptance), manage stressors, process past events, manage overall stressors, including interpersonal, and symptoms, increase patience, reduce jumping to conclusions. [name] provided verbal approval of the treatment plan.    Interventions: Psycho-education & Goal Setting.   Diagnosis:   Adjustment disorder with mixed anxiety and depressed mood  Psychiatric Treatment: No , NA  Treatment Plan:  Client Abilities/Strengths [name] is intelligent, self-aware, and motivated for change.   Support System: Family  Client Treatment Preferences Outpatient Therapy.   Client Statement of Needs [name] would like to improve his focus and concentration, work on being less forgetful, being more mindful, develop comfort with self and personality (acceptance), manage stressors, process past events, manage overall stressors, including interpersonal, and symptoms, increase patience, reduce jumping to conclusions.   Treatment Level Biweekly  Symptoms  Anxiety: Poor focus and concentration, cognitive distortion  (jumping to conclusions), feeling on edge, mild irritability, worry. (Status: maintained)  Goals:   [name] experiences symptoms of depression and anxiety.   Treatment plan signed and available on s-drive:  No, pending signature.    Target Date: 01/03/24 Frequency: Biweekly  Progress: 0 Modality: individual    Therapist will provide referrals for additional resources as appropriate.  Therapist will provide psycho-education regarding [name]'s diagnosis and corresponding treatment approaches and interventions. Licensed Clinical Social Worker, [place], LCSW will support the patient's ability to achieve the goals identified. will employ CBT, BA, Problem-solving, Solution Focused, Mindfulness,  coping skills, & other evidenced-based practices will be used to promote progress towards healthy functioning to help manage decrease symptoms associated with his diagnosis.   Reduce overall level, frequency, and intensity of the feelings of depression, anxiety and panic evidenced by decreased overall symptoms from 6 to 7 days/week to 0 to 1 days/week per client report  for at least 3 consecutive months. Verbally  express understanding of the relationship between feelings of depression, anxiety and their impact on thinking patterns and behaviors. Verbalize an understanding of the role that distorted thinking plays in creating fears, excessive worry, and ruminations.  [name] participated in the creation of the treatment plan)    John Ovens, LCSW

## 2023-01-04 ENCOUNTER — Encounter (INDEPENDENT_AMBULATORY_CARE_PROVIDER_SITE_OTHER): Payer: Self-pay

## 2023-01-21 ENCOUNTER — Other Ambulatory Visit: Payer: Self-pay | Admitting: Adult Health

## 2023-01-31 ENCOUNTER — Ambulatory Visit (INDEPENDENT_AMBULATORY_CARE_PROVIDER_SITE_OTHER): Payer: Medicare PPO | Admitting: Adult Health

## 2023-01-31 VITALS — BP 100/68 | HR 78 | Temp 97.5°F | Ht 67.5 in | Wt 181.0 lb

## 2023-01-31 DIAGNOSIS — I480 Paroxysmal atrial fibrillation: Secondary | ICD-10-CM

## 2023-01-31 DIAGNOSIS — J849 Interstitial pulmonary disease, unspecified: Secondary | ICD-10-CM

## 2023-01-31 DIAGNOSIS — Z0000 Encounter for general adult medical examination without abnormal findings: Secondary | ICD-10-CM

## 2023-01-31 DIAGNOSIS — E039 Hypothyroidism, unspecified: Secondary | ICD-10-CM

## 2023-01-31 DIAGNOSIS — N400 Enlarged prostate without lower urinary tract symptoms: Secondary | ICD-10-CM | POA: Diagnosis not present

## 2023-01-31 DIAGNOSIS — Z7984 Long term (current) use of oral hypoglycemic drugs: Secondary | ICD-10-CM | POA: Diagnosis not present

## 2023-01-31 DIAGNOSIS — I10 Essential (primary) hypertension: Secondary | ICD-10-CM | POA: Diagnosis not present

## 2023-01-31 DIAGNOSIS — E119 Type 2 diabetes mellitus without complications: Secondary | ICD-10-CM | POA: Diagnosis not present

## 2023-01-31 DIAGNOSIS — Z23 Encounter for immunization: Secondary | ICD-10-CM | POA: Diagnosis not present

## 2023-01-31 DIAGNOSIS — G4733 Obstructive sleep apnea (adult) (pediatric): Secondary | ICD-10-CM | POA: Diagnosis not present

## 2023-01-31 LAB — CBC
HCT: 50.3 % (ref 39.0–52.0)
Hemoglobin: 15.9 g/dL (ref 13.0–17.0)
MCHC: 31.7 g/dL (ref 30.0–36.0)
MCV: 81.6 fl (ref 78.0–100.0)
Platelets: 263 10*3/uL (ref 150.0–400.0)
RBC: 6.16 Mil/uL — ABNORMAL HIGH (ref 4.22–5.81)
RDW: 17.4 % — ABNORMAL HIGH (ref 11.5–15.5)
WBC: 10.7 10*3/uL — ABNORMAL HIGH (ref 4.0–10.5)

## 2023-01-31 LAB — COMPREHENSIVE METABOLIC PANEL
ALT: 27 U/L (ref 0–53)
AST: 26 U/L (ref 0–37)
Albumin: 4.4 g/dL (ref 3.5–5.2)
Alkaline Phosphatase: 67 U/L (ref 39–117)
BUN: 17 mg/dL (ref 6–23)
CO2: 27 mEq/L (ref 19–32)
Calcium: 9.9 mg/dL (ref 8.4–10.5)
Chloride: 102 mEq/L (ref 96–112)
Creatinine, Ser: 0.85 mg/dL (ref 0.40–1.50)
GFR: 89.16 mL/min (ref >60.00)
Glucose, Bld: 150 mg/dL — ABNORMAL HIGH (ref 70–99)
Potassium: 4.3 mEq/L (ref 3.5–5.1)
Sodium: 140 mEq/L (ref 135–145)
Total Bilirubin: 1.8 mg/dL — ABNORMAL HIGH (ref 0.2–1.2)
Total Protein: 7.5 g/dL (ref 6.0–8.3)

## 2023-01-31 LAB — TSH: TSH: 2.08 u[IU]/mL (ref 0.35–5.50)

## 2023-01-31 LAB — LIPID PANEL
Cholesterol: 141 mg/dL (ref 0–200)
HDL: 50.1 mg/dL (ref >39.00)
LDL Cholesterol: 70 mg/dL (ref 0–99)
NonHDL: 90.42
Total CHOL/HDL Ratio: 3
Triglycerides: 103 mg/dL (ref 0.0–149.0)
VLDL: 20.6 mg/dL (ref 0.0–40.0)

## 2023-01-31 LAB — HEMOGLOBIN A1C: Hgb A1c MFr Bld: 9.1 % — ABNORMAL HIGH (ref 4.6–6.5)

## 2023-01-31 LAB — PSA: PSA: 1.09 ng/mL (ref 0.10–4.00)

## 2023-01-31 NOTE — Patient Instructions (Signed)
It was great seeing you today   We will follow up with you regarding your lab work   Please let me know if you need anything   
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
## 2023-02-01 ENCOUNTER — Other Ambulatory Visit: Payer: Self-pay | Admitting: Adult Health

## 2023-02-07 ENCOUNTER — Ambulatory Visit (INDEPENDENT_AMBULATORY_CARE_PROVIDER_SITE_OTHER): Payer: Medicare PPO | Admitting: Psychology

## 2023-02-07 DIAGNOSIS — F4323 Adjustment disorder with mixed anxiety and depressed mood: Secondary | ICD-10-CM

## 2023-02-07 NOTE — Progress Notes (Signed)
[facility] Counselor/Therapist Progress Note  Patient ID: [name], MRN: [id number]    Date: 02/07/23  Time Spent: 11:02  am - 12:01am :59 Minutes  Treatment Type: Individual Therapy.  Reported Symptoms: depression, anxiety, and interpersonal stressors.   Mental Status Exam: Appearance:  Casual     Behavior: Appropriate  Motor: Normal  Speech/Language:  Clear and Coherent and Normal Rate  Affect: Congruent  Mood: normal  Thought process: normal  Thought content:   WNL  Sensory/Perceptual disturbances:   WNL  Orientation: oriented to person, place, time/date, and situation  Attention: Good  Concentration: Good  Memory: WNL  Fund of knowledge:  Good  Insight:   Good  Judgment:  Good  Impulse Control: Good   Risk Assessment: Danger to Self:  No Self-injurious Behavior: No Danger to Others: No Duty to Warn:no Physical Aggression / Violence:No  Access to Firearms a concern: No  Gang Involvement:No   Subjective:   Deke Rench Fanning participated in the session, in person in the office with the therapist, and consented to treatment Kedar reviewed the events of the past week. He noted things going well with him and his wife, [name] Snowball. He noted locating a list he created, 2 years ago, about things he doesn't "like" about himself. He noted his list being fluid and noted this being situational, at times. We explored this list, during the session, and his feelings in relation to the list. Therapist encouraged Udell to identify areas of control or lack of control and if these behaviors He noted that He and his wife, Karoline Caldwell, continue to go to couple's counseling and noted this being stressful, at times, due to feeling emotional at times. He noted often not sharing his opinions or feelings due to Angie's reaction. We continued to explore this and ways to communicate feelings and needs in couple's therapy. He noted his wife's difficulty with managing distress and  discussed that she often keeps score. Therapist encouraged Orrie to identify needs, verbalize them, and find areas of compromise. Naji was engaged and motivated during the session and expressed commitment towards session goals. Therapist provided supportive therapy. A follow-up was scheduled for continued treatment.    Interventions: Psycho-education & Goal Setting.   Diagnosis:   Adjustment disorder with mixed anxiety and depressed mood  Psychiatric Treatment: No , NA  Treatment Plan:  Client Abilities/Strengths Volney is intelligent, self-aware, and motivated for change.   Support System: Family  Client Treatment Preferences Outpatient Therapy.   Client Statement of Needs Varick would like to improve his focus and concentration, work on being less forgetful, being more mindful, develop comfort with self and personality (acceptance), manage stressors, process past events, manage overall stressors, including interpersonal, and symptoms, increase patience, reduce jumping to conclusions.   Treatment Level Biweekly  Symptoms  Anxiety: Poor focus and concentration, cognitive distortion  (jumping to conclusions), feeling on edge, mild irritability, worry. (Status: maintained)  Goals:   Ardith experiences symptoms of depression and anxiety.   Treatment plan signed and available on s-drive:  No, pending signature.    Target Date: 01/03/24 Frequency: Biweekly  Progress: 0 Modality: individual    Therapist will provide referrals for additional resources as appropriate.  Therapist will provide psycho-education regarding Tylique's diagnosis and corresponding treatment approaches and interventions. Licensed Clinical Social Worker, [place], LCSW will support the patient's ability to achieve the goals identified. will employ CBT, BA, Problem-solving, Solution Focused, Mindfulness,  coping skills, & other evidenced-based practices will be used to promote  progress towards healthy functioning to  help manage decrease symptoms associated with his diagnosis.   Reduce overall level, frequency, and intensity of the feelings of depression, anxiety and panic evidenced by decreased overall symptoms from 6 to 7 days/week to 0 to 1 days/week per client report for at least 3 consecutive months. Verbally express understanding of the relationship between feelings of depression, anxiety and their impact on thinking patterns and behaviors. Verbalize an understanding of the role that distorted thinking plays in creating fears, excessive worry, and ruminations.  [name] participated in the creation of the treatment plan)    Delight Ovens, LCSW

## 2023-02-15 DIAGNOSIS — E039 Hypothyroidism, unspecified: Secondary | ICD-10-CM | POA: Diagnosis not present

## 2023-02-15 DIAGNOSIS — J449 Chronic obstructive pulmonary disease, unspecified: Secondary | ICD-10-CM | POA: Diagnosis not present

## 2023-02-15 DIAGNOSIS — E1129 Type 2 diabetes mellitus with other diabetic kidney complication: Secondary | ICD-10-CM | POA: Diagnosis not present

## 2023-02-15 DIAGNOSIS — R809 Proteinuria, unspecified: Secondary | ICD-10-CM | POA: Diagnosis not present

## 2023-02-15 DIAGNOSIS — R911 Solitary pulmonary nodule: Secondary | ICD-10-CM | POA: Diagnosis not present

## 2023-02-15 DIAGNOSIS — J849 Interstitial pulmonary disease, unspecified: Secondary | ICD-10-CM | POA: Diagnosis not present

## 2023-02-17 ENCOUNTER — Other Ambulatory Visit: Payer: Self-pay | Admitting: Adult Health

## 2023-02-17 DIAGNOSIS — R1310 Dysphagia, unspecified: Secondary | ICD-10-CM | POA: Diagnosis not present

## 2023-02-17 DIAGNOSIS — K219 Gastro-esophageal reflux disease without esophagitis: Secondary | ICD-10-CM | POA: Diagnosis not present

## 2023-02-17 DIAGNOSIS — Z86010 Personal history of colonic polyps: Secondary | ICD-10-CM | POA: Diagnosis not present

## 2023-03-01 ENCOUNTER — Other Ambulatory Visit: Payer: Self-pay | Admitting: Adult Health

## 2023-03-09 DIAGNOSIS — M5416 Radiculopathy, lumbar region: Secondary | ICD-10-CM | POA: Diagnosis not present

## 2023-03-09 DIAGNOSIS — M5116 Intervertebral disc disorders with radiculopathy, lumbar region: Secondary | ICD-10-CM | POA: Diagnosis not present

## 2023-03-16 ENCOUNTER — Encounter: Payer: Self-pay | Admitting: Adult Health

## 2023-03-21 ENCOUNTER — Ambulatory Visit (INDEPENDENT_AMBULATORY_CARE_PROVIDER_SITE_OTHER): Payer: Medicare PPO | Admitting: Psychology

## 2023-03-21 DIAGNOSIS — F4323 Adjustment disorder with mixed anxiety and depressed mood: Secondary | ICD-10-CM

## 2023-03-21 DIAGNOSIS — G4733 Obstructive sleep apnea (adult) (pediatric): Secondary | ICD-10-CM | POA: Diagnosis not present

## 2023-03-21 NOTE — Progress Notes (Addendum)
[facility] Counselor/Therapist Progress Note  Patient ID: Kylan Shonk, MRN: [id number]    Date: 03/21/23  Time Spent: 11:04  am - 12:03 pm :  59    Minutes  Treatment Type: Individual Therapy.  Reported Symptoms: depression, anxiety, and interpersonal stressors.   Mental Status Exam: Appearance:  Casual     Behavior: Appropriate  Motor: Normal  Speech/Language:  Clear and Coherent and Normal Rate  Affect: Congruent  Mood: dysthymic  Thought process: normal  Thought content:   WNL  Sensory/Perceptual disturbances:   WNL  Orientation: oriented to person, place, time/date, and situation  Attention: Good  Concentration: Good  Memory: WNL  Fund of knowledge:  Good  Insight:   Good  Judgment:  Good  Impulse Control: Good   Risk Assessment: Danger to Self:  No Self-injurious Behavior: No Danger to Others: No Duty to Warn:no Physical Aggression / Violence:No  Access to Firearms a concern: No  Gang Involvement:No   Subjective:   Jadakiss Kump Fishman participated in the session, in person in the office with the therapist, and consented to treatment Haytham reviewed the events of the past week. Millard noted working on Clinical research associate of things he did not "like" about himself in areas he can control and not control. We reviewed and processed this list during the session. We worked on re-framing the entries on the list to be more positive and adaptive. Therapist challenged Daiki's distortions including jumping to conclusions during the session. He noted varying perspectives between him and his wife. Therapist highlighted many entries on the list in common with ADHD clinical criteria. Givon would benefit from a psychiatric consult due to the results of the screening. Resources were provided for additional psycho-education regarding ADHD. Hameed will consider an evaluation and resources will be provided, upon request. We worked on exploring his perspective regarding his  list in relation to her perspective of the list entries. Zaragoza highlighted stark differences that affect both perspective and result, at time, in interpersonal stressors. Therapist validated Minor's individuality and needs and provided supportive therapy. A follow-up was scheduled for continued treatment.     ASRS V1.1  Part-A 1. How often do you have trouble wrapping up the final details of a project,     once the challenging parts have been done? Sometimes 2. How often do you have difficulty getting things in order when you have to do     a task that requires organization? Rarely 3. How often do you have problems remembering appointments or obligations? Rarely 4. When you have a task that requires a lot of thought, how often do you avoid     or delay getting started? Often 5. How often do you fidget or squirm with your hands or feet when you have     to sit down for a long time? Sometimes 6. How often do you feel overly active and compelled to do things, like you     were driven by a motor? Very Often  Part-B 7. How often do you make careless mistakes when you have to work on a boring or     difficult project? Sometimes 8. How often do you have difficulty keeping your attention when you are doing boring     or repetitive work? Rarely 9. How often do you have difficulty concentrating on what people say to you,      even when they are speaking to you directly? Sometimes 10. How often do you misplace or  have difficulty finding things at home or at work? Very Often 11. How often are you distracted by activity or noise around you? Often 12. How often do you leave your seat in meetings or other situations in which       you are expected to remain seated? Never 13. How often do you feel restless or fidgety? Often 14. How often do you have difficulty unwinding and relaxing when you have time       to yourself? Never 15. How often do you find yourself talking too much when you are in social  situations? Often 16. When you're in a conversation, how often do you find yourself finishing           the sentences of the people you are talking to, before they can finish       them themselves? Rarely 17. How often do you have difficulty waiting your turn in situations when       turn taking is required? Sometimes 18. How often do you interrupt others when they are busy? Often   Interventions: Psycho-education & Goal Setting.   Diagnosis:   Adjustment disorder with mixed anxiety and depressed mood  Psychiatric Treatment: No , NA  Treatment Plan:  Client Abilities/Strengths Eliezar is intelligent, self-aware, and motivated for change.   Support System: Family  Client Treatment Preferences Outpatient Therapy.   Client Statement of Needs Gio would like to improve his focus and concentration, work on being less forgetful, being more mindful, develop comfort with self and personality (acceptance), manage stressors, process past events, manage overall stressors, including interpersonal, and symptoms, increase patience, reduce jumping to conclusions.   Treatment Level Biweekly  Symptoms  Anxiety: Poor focus and concentration, cognitive distortion  (jumping to conclusions), feeling on edge, mild irritability, worry. (Status: maintained)  Goals:   Brandin experiences symptoms of depression and anxiety.   Treatment plan signed and available on s-drive:  No, pending signature.    Target Date: 01/03/24 Frequency: Biweekly  Progress: 0 Modality: individual    Therapist will provide referrals for additional resources as appropriate.  Therapist will provide psycho-education regarding Attikus's diagnosis and corresponding treatment approaches and interventions. Licensed Clinical Social Worker, [place], LCSW will support the patient's ability to achieve the goals identified. will employ CBT, BA, Problem-solving, Solution Focused, Mindfulness,  coping skills, & other evidenced-based  practices will be used to promote progress towards healthy functioning to help manage decrease symptoms associated with his diagnosis.   Reduce overall level, frequency, and intensity of the feelings of depression, anxiety and panic evidenced by decreased overall symptoms from 6 to 7 days/week to 0 to 1 days/week per client report for at least 3 consecutive months. Verbally express understanding of the relationship between feelings of depression, anxiety and their impact on thinking patterns and behaviors. Verbalize an understanding of the role that distorted thinking plays in creating fears, excessive worry, and ruminations.  [name] participated in the creation of the treatment plan)    Delight Ovens, LCSW

## 2023-03-26 ENCOUNTER — Encounter: Payer: Self-pay | Admitting: Adult Health

## 2023-03-27 NOTE — Telephone Encounter (Signed)
Please advise 

## 2023-03-28 DIAGNOSIS — R1310 Dysphagia, unspecified: Secondary | ICD-10-CM | POA: Diagnosis not present

## 2023-04-08 ENCOUNTER — Other Ambulatory Visit: Payer: Self-pay | Admitting: Adult Health

## 2023-04-11 ENCOUNTER — Encounter: Payer: Self-pay | Admitting: Adult Health

## 2023-04-21 ENCOUNTER — Other Ambulatory Visit: Payer: Self-pay | Admitting: Adult Health

## 2023-05-02 ENCOUNTER — Ambulatory Visit (INDEPENDENT_AMBULATORY_CARE_PROVIDER_SITE_OTHER): Payer: Medicare PPO | Admitting: Psychology

## 2023-05-02 DIAGNOSIS — F4323 Adjustment disorder with mixed anxiety and depressed mood: Secondary | ICD-10-CM | POA: Diagnosis not present

## 2023-05-02 NOTE — Progress Notes (Signed)
[facility] Counselor/Therapist Progress Note  Patient ID: [name], MRN: [id number]    Date: 05/02/23  Time Spent: 11:04  am - 12:00 pm : 56  Minutes  Treatment Type: Individual Therapy.  Reported Symptoms: depression, anxiety, and interpersonal stressors.   Mental Status Exam: Appearance:  Casual     Behavior: Appropriate  Motor: Normal  Speech/Language:  Clear and Coherent and Normal Rate  Affect: Congruent  Mood: dysthymic  Thought process: normal  Thought content:   WNL  Sensory/Perceptual disturbances:   WNL  Orientation: oriented to person, place, time/date, and situation  Attention: Good  Concentration: Good  Memory: WNL  Fund of knowledge:  Good  Insight:   Good  Judgment:  Good  Impulse Control: Good   Risk Assessment: Danger to Self:  No Self-injurious Behavior: No Danger to Others: No Duty to Warn:no Physical Aggression / Violence:No  Access to Firearms a concern: No  Gang Involvement:No   Subjective:   [name] participated in the session, in person in the office with the therapist, and consented to treatment [name] reviewed the events of the past week. [name] noted continuing to research and learn about ADHD. He noted much of his learning resonating with him, specifically working memory, going from one task to another without completing the initial task, interrupting others, "talking too much", hyperactivity (driven by a motor), and being forgetful. He noted being the class clown, hyperactive, and couldn't sit in his seat during elementary school. He noted having an initial psychiatric appointment, on Christmas Eve, with [name], PMHNP, AGNP with [facility]. He noted  the effect of politics, mass shootings, and changes to the environment being stressful and noted electing to discontinue watching TV as a result. He noted his parents "cutting me off" at 67 years old and having to support self aside of rent and  food.  He noted having to be self-sufficient at a young age. "Father instilled in me that I am not enough".  He noted frustration regarding experiencing a delay in his hearing aid repair. He continues to attend couple's counseling and noted this being beneficial. [name] was engaged and motivated during the session. He expressed commitment towards goals. Therapist praised [name] for his effort during the session and following up with the psychiatric resources. A follow-up was scheduled for continued treatment.    Interventions: CBT   Diagnosis:   Adjustment disorder with mixed anxiety and depressed mood  Psychiatric Treatment: No , NA  Treatment Plan:  Client Abilities/Strengths [name] is intelligent, self-aware, and motivated for change.   Support System: Family  Client Treatment Preferences Outpatient Therapy.   Client Statement of Needs [name] would like to improve his focus and concentration, work on being less forgetful, being more mindful, develop comfort with self and personality (acceptance), manage stressors, process past events, manage overall stressors, including interpersonal, and symptoms, increase patience, reduce jumping to conclusions.   Treatment Level Biweekly  Symptoms  Anxiety: Poor focus and concentration, cognitive distortion  (jumping to conclusions), feeling on edge, mild irritability, worry. (Status: maintained)  Goals:   [name] experiences symptoms of depression and anxiety.   Treatment plan signed and available on s-drive:  No, pending signature.    Target Date: 01/03/24 Frequency: Biweekly  Progress: 0 Modality: individual    Therapist will provide referrals for additional resources as appropriate.  Therapist will provide psycho-education regarding [name]'s diagnosis and corresponding treatment approaches and interventions. Licensed Clinical Social Worker, [place], LCSW will support the patient's  ability to achieve the goals identified. will employ CBT,  BA, Problem-solving, Solution Focused, Mindfulness,  coping skills, & other evidenced-based practices will be used to promote progress towards healthy functioning to help manage decrease symptoms associated with his diagnosis.   Reduce overall level, frequency, and intensity of the feelings of depression, anxiety and panic evidenced by decreased overall symptoms from 6 to 7 days/week to 0 to 1 days/week per client report for at least 3 consecutive months. Verbally express understanding of the relationship between feelings of depression, anxiety and their impact on thinking patterns and behaviors. Verbalize an understanding of the role that distorted thinking plays in creating fears, excessive worry, and ruminations.  [name] participated in the creation of the treatment plan)    John Ovens, LCSW

## 2023-05-12 ENCOUNTER — Ambulatory Visit: Payer: Medicare PPO | Admitting: Adult Health

## 2023-05-18 ENCOUNTER — Encounter: Payer: Self-pay | Admitting: Adult Health

## 2023-05-18 ENCOUNTER — Ambulatory Visit: Payer: Medicare PPO | Admitting: Adult Health

## 2023-05-18 VITALS — BP 110/70 | HR 70 | Temp 97.9°F | Ht 67.5 in | Wt 186.0 lb

## 2023-05-18 DIAGNOSIS — Z7984 Long term (current) use of oral hypoglycemic drugs: Secondary | ICD-10-CM

## 2023-05-18 DIAGNOSIS — E119 Type 2 diabetes mellitus without complications: Secondary | ICD-10-CM | POA: Diagnosis not present

## 2023-05-18 DIAGNOSIS — I10 Essential (primary) hypertension: Secondary | ICD-10-CM

## 2023-05-18 DIAGNOSIS — Z794 Long term (current) use of insulin: Secondary | ICD-10-CM | POA: Diagnosis not present

## 2023-05-18 LAB — POCT GLYCOSYLATED HEMOGLOBIN (HGB A1C): Hemoglobin A1C: 7.9 % — AB (ref 4.0–5.6)

## 2023-05-18 MED ORDER — PEN NEEDLES 31G X 5 MM MISC: 3 refills | Status: AC
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
## 2023-05-23 DIAGNOSIS — I25118 Atherosclerotic heart disease of native coronary artery with other forms of angina pectoris: Secondary | ICD-10-CM | POA: Diagnosis not present

## 2023-05-23 DIAGNOSIS — I480 Paroxysmal atrial fibrillation: Secondary | ICD-10-CM | POA: Diagnosis not present

## 2023-05-23 DIAGNOSIS — I10 Essential (primary) hypertension: Secondary | ICD-10-CM | POA: Diagnosis not present

## 2023-05-23 DIAGNOSIS — R809 Proteinuria, unspecified: Secondary | ICD-10-CM | POA: Diagnosis not present

## 2023-05-23 DIAGNOSIS — E1129 Type 2 diabetes mellitus with other diabetic kidney complication: Secondary | ICD-10-CM | POA: Diagnosis not present

## 2023-05-23 DIAGNOSIS — E7800 Pure hypercholesterolemia, unspecified: Secondary | ICD-10-CM | POA: Diagnosis not present

## 2023-05-23 DIAGNOSIS — I700 Atherosclerosis of aorta: Secondary | ICD-10-CM | POA: Diagnosis not present

## 2023-05-23 DIAGNOSIS — I499 Cardiac arrhythmia, unspecified: Secondary | ICD-10-CM | POA: Diagnosis not present

## 2023-05-24 ENCOUNTER — Encounter: Payer: Self-pay | Admitting: Adult Health

## 2023-05-24 DIAGNOSIS — I480 Paroxysmal atrial fibrillation: Secondary | ICD-10-CM | POA: Diagnosis not present

## 2023-05-24 DIAGNOSIS — I499 Cardiac arrhythmia, unspecified: Secondary | ICD-10-CM | POA: Diagnosis not present

## 2023-05-24 NOTE — Telephone Encounter (Signed)
 Care team updated and letter sent for eye exam notes.

## 2023-05-30 ENCOUNTER — Ambulatory Visit: Payer: Medicare PPO | Admitting: Adult Health

## 2023-05-30 ENCOUNTER — Encounter: Payer: Self-pay | Admitting: Adult Health

## 2023-05-30 VITALS — BP 128/62 | HR 93 | Temp 97.9°F | Ht 67.5 in | Wt 192.0 lb

## 2023-05-30 DIAGNOSIS — E119 Type 2 diabetes mellitus without complications: Secondary | ICD-10-CM

## 2023-05-30 DIAGNOSIS — I10 Essential (primary) hypertension: Secondary | ICD-10-CM | POA: Diagnosis not present

## 2023-05-30 DIAGNOSIS — Z7985 Long-term (current) use of injectable non-insulin antidiabetic drugs: Secondary | ICD-10-CM | POA: Diagnosis not present

## 2023-05-30 DIAGNOSIS — Z7984 Long term (current) use of oral hypoglycemic drugs: Secondary | ICD-10-CM

## 2023-05-30 DIAGNOSIS — F4329 Adjustment disorder with other symptoms: Secondary | ICD-10-CM | POA: Diagnosis not present
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
## 2023-06-06 DIAGNOSIS — N3941 Urge incontinence: Secondary | ICD-10-CM | POA: Diagnosis not present

## 2023-06-06 DIAGNOSIS — N401 Enlarged prostate with lower urinary tract symptoms: Secondary | ICD-10-CM | POA: Diagnosis not present

## 2023-06-06 DIAGNOSIS — R350 Frequency of micturition: Secondary | ICD-10-CM | POA: Diagnosis not present

## 2023-06-13 ENCOUNTER — Ambulatory Visit: Payer: Medicare PPO | Admitting: Psychology

## 2023-06-13 DIAGNOSIS — F4323 Adjustment disorder with mixed anxiety and depressed mood: Secondary | ICD-10-CM | POA: Diagnosis not present

## 2023-06-13 NOTE — Progress Notes (Signed)
 [facility] Counselor/Therapist Progress Note  Patient ID: [name], MRN: [id number]    Date: 06/13/23  Time Spent: 11:08  am - 12:07 pm : 59  Minutes  Treatment Type: Individual Therapy.  Reported Symptoms: depression, anxiety, and interpersonal stressors.   Mental Status Exam: Appearance:  Casual     Behavior: Appropriate  Motor: Normal  Speech/Language:  Clear and Coherent and Normal Rate  Affect: Congruent  Mood: dysthymic  Thought process: normal  Thought content:   WNL  Sensory/Perceptual disturbances:   WNL  Orientation: oriented to person, place, time/date, and situation  Attention: Good  Concentration: Good  Memory: WNL  Fund of knowledge:  Good  Insight:   Good  Judgment:  Good  Impulse Control: Good   Risk Assessment: Danger to Self:  No Self-injurious Behavior: No Danger to Others: No Duty to Warn:no Physical Aggression / Violence:No  Access to Firearms a concern: No  Gang Involvement:No   Subjective:   [name] participated in the session, in person in the office with the therapist, and consented to treatment [name] reviewed the events of the past week. He noted being slated to meet with a psychiatric provider to discuss his attention around Jan. 20th. He met for an initial consult and was recommended for testing. He noted increased frustration with his forgetfulness, disorganization, and misplacing items. He noted his recent work on being more organized but noted misplacing his recently organized files. He noted feeling unhappy and frustrated with this and noted now getting angry and seethingly mad. We worked on processing this during our session and therapist highlighted [name]'s negative self-talk. We discussed ways to address barriers to his organization including creating specific locations for item he continues to misplace or forget the location of. We worked in identifying possible tools that could aid in getting more  organized. We worked on processing his feelings of frustration and discussed ways to engage in feeling identification  when frustrated. We practiced this during the session. We discussed the importance of being mindful, purposeful, present, and proactively managing distress during stressful moments. He noted his avoidance to address an issue with his sister and eventually setting a boundary and noted his sister reacting poorly to this. He noted being angry at himself for not being more clear with his boundary setting initially. Therapist highlighted [name]'s negative self-talk during the session including I shouldn't have done that and what are you, a wuss?. We worked processing this during the session. Therapist provided psycho-education regarding negative self-talk and cognitive distortion on mood, self-image, and functioning. Therapist modeled challenging negative self-talk. He noted losing weight and struggling with his self-image and noted needing to reduce his workout frequency. We will work on processing this going forward. Therapist praised [name] for his effort during the session. Therapist validated [name]'s feelings and experience and provided supportive therapy. A Follow-up was scheduled for continued treatment.   Interventions: CBT   Diagnosis:   Adjustment disorder with mixed anxiety and depressed mood  Psychiatric Treatment: No , NA  Treatment Plan:  Client Abilities/Strengths Kimble is intelligent, self-aware, and motivated for change.   Support System: Family  Client Treatment Preferences Outpatient Therapy.   Client Statement of Needs [name] would like to improve his focus and concentration, work on being less forgetful, being more mindful, develop comfort with self and personality (acceptance), manage stressors, process past events, manage overall stressors, including interpersonal, and symptoms, increase patience, reduce jumping to conclusions.   Treatment  Level Biweekly  Symptoms  Anxiety: Poor focus and concentration, cognitive distortion  (jumping to conclusions), feeling on edge, mild irritability, worry. (Status: maintained)  Goals:   [name] experiences symptoms of depression and anxiety.   Treatment plan signed and available on s-drive:  No, pending signature.    Target Date: 01/03/24 Frequency: Biweekly  Progress: 0 Modality: individual    Therapist will provide referrals for additional resources as appropriate.  Therapist will provide psycho-education regarding [name]'s diagnosis and corresponding treatment approaches and interventions. Licensed Clinical Social Worker, [place], LCSW will support the patient's ability to achieve the goals identified. will employ CBT, BA, Problem-solving, Solution Focused, Mindfulness,  coping skills, & other evidenced-based practices will be used to promote progress towards healthy functioning to help manage decrease symptoms associated with his diagnosis.   Reduce overall level, frequency, and intensity of the feelings of depression, anxiety and panic evidenced by decreased overall symptoms from 6 to 7 days/week to 0 to 1 days/week per client report for at least 3 consecutive months. Verbally express understanding of the relationship between feelings of depression, anxiety and their impact on thinking patterns and behaviors. Verbalize an understanding of the role that distorted thinking plays in creating fears, excessive worry, and ruminations.  [name] participated in the creation of the treatment plan)    Elvie Mullet, LCSW

## 2023-06-21 DIAGNOSIS — G4733 Obstructive sleep apnea (adult) (pediatric): Secondary | ICD-10-CM | POA: Diagnosis not present

## 2023-06-23 DIAGNOSIS — M48062 Spinal stenosis, lumbar region with neurogenic claudication: Secondary | ICD-10-CM | POA: Diagnosis not present

## 2023-06-23 DIAGNOSIS — Z6829 Body mass index (BMI) 29.0-29.9, adult: Secondary | ICD-10-CM | POA: Diagnosis not present

## 2023-06-26 ENCOUNTER — Encounter: Payer: Self-pay | Admitting: Adult Health

## 2023-06-27 DIAGNOSIS — F900 Attention-deficit hyperactivity disorder, predominantly inattentive type: Secondary | ICD-10-CM | POA: Diagnosis not present

## 2023-06-27 NOTE — Telephone Encounter (Signed)
FYI

## 2023-06-30 DIAGNOSIS — I480 Paroxysmal atrial fibrillation: Secondary | ICD-10-CM | POA: Diagnosis not present

## 2023-06-30 DIAGNOSIS — G4733 Obstructive sleep apnea (adult) (pediatric): Secondary | ICD-10-CM | POA: Diagnosis not present

## 2023-06-30 DIAGNOSIS — J849 Interstitial pulmonary disease, unspecified: Secondary | ICD-10-CM | POA: Diagnosis not present

## 2023-07-04 DIAGNOSIS — I10 Essential (primary) hypertension: Secondary | ICD-10-CM | POA: Diagnosis not present

## 2023-07-04 DIAGNOSIS — I480 Paroxysmal atrial fibrillation: Secondary | ICD-10-CM | POA: Diagnosis not present

## 2023-07-04 DIAGNOSIS — E119 Type 2 diabetes mellitus without complications: Secondary | ICD-10-CM | POA: Diagnosis not present

## 2023-07-04 DIAGNOSIS — I252 Old myocardial infarction: Secondary | ICD-10-CM | POA: Diagnosis not present

## 2023-07-04 DIAGNOSIS — G4733 Obstructive sleep apnea (adult) (pediatric): Secondary | ICD-10-CM | POA: Diagnosis not present

## 2023-07-04 DIAGNOSIS — K222 Esophageal obstruction: Secondary | ICD-10-CM | POA: Diagnosis not present

## 2023-07-04 DIAGNOSIS — R1310 Dysphagia, unspecified: Secondary | ICD-10-CM | POA: Diagnosis not present

## 2023-07-04 DIAGNOSIS — E039 Hypothyroidism, unspecified: Secondary | ICD-10-CM | POA: Diagnosis not present

## 2023-07-04 DIAGNOSIS — K3189 Other diseases of stomach and duodenum: Secondary | ICD-10-CM | POA: Diagnosis not present

## 2023-07-04 DIAGNOSIS — F901 Attention-deficit hyperactivity disorder, predominantly hyperactive type: Secondary | ICD-10-CM | POA: Diagnosis not present

## 2023-07-06 DIAGNOSIS — N5201 Erectile dysfunction due to arterial insufficiency: Secondary | ICD-10-CM | POA: Diagnosis not present

## 2023-07-13 DIAGNOSIS — M5416 Radiculopathy, lumbar region: Secondary | ICD-10-CM | POA: Diagnosis not present

## 2023-07-13 DIAGNOSIS — M5116 Intervertebral disc disorders with radiculopathy, lumbar region: Secondary | ICD-10-CM | POA: Diagnosis not present

## 2023-07-25 ENCOUNTER — Ambulatory Visit: Payer: Medicare PPO | Admitting: Psychology

## 2023-08-01 ENCOUNTER — Ambulatory Visit: Payer: Medicare PPO | Admitting: Psychology

## 2023-08-01 ENCOUNTER — Other Ambulatory Visit: Payer: Self-pay | Admitting: Physician Assistant

## 2023-08-01 ENCOUNTER — Encounter: Payer: Self-pay | Admitting: Adult Health

## 2023-08-01 DIAGNOSIS — F4323 Adjustment disorder with mixed anxiety and depressed mood: Secondary | ICD-10-CM
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
## 2023-08-03 NOTE — Telephone Encounter (Signed)
 Please advise. I do not see any on pt [facility] database.

## 2023-08-08 ENCOUNTER — Encounter: Payer: Self-pay | Admitting: Adult Health

## 2023-08-09 NOTE — Telephone Encounter (Signed)
**Note De-identified  [place] Obfuscation** Please advise 

## 2023-08-11 DIAGNOSIS — S098XXA Other specified injuries of head, initial encounter: Secondary | ICD-10-CM | POA: Diagnosis not present

## 2023-08-11 DIAGNOSIS — R42 Dizziness and giddiness: Secondary | ICD-10-CM | POA: Diagnosis not present

## 2023-08-11 DIAGNOSIS — S0990XA Unspecified injury of head, initial encounter: Secondary | ICD-10-CM | POA: Diagnosis not present

## 2023-08-11 DIAGNOSIS — I6523 Occlusion and stenosis of bilateral carotid arteries: Secondary | ICD-10-CM | POA: Diagnosis not present

## 2023-08-24 ENCOUNTER — Encounter: Payer: Self-pay | Admitting: Adult Health

## 2023-08-24 ENCOUNTER — Ambulatory Visit: Payer: Medicare PPO | Admitting: Adult Health

## 2023-08-24 VITALS — BP 100/80 | HR 93 | Temp 97.9°F | Ht 67.5 in | Wt 193.0 lb

## 2023-08-24 DIAGNOSIS — E119 Type 2 diabetes mellitus without complications: Secondary | ICD-10-CM | POA: Diagnosis not present

## 2023-08-24 DIAGNOSIS — Z7984 Long term (current) use of oral hypoglycemic drugs: Secondary | ICD-10-CM | POA: Diagnosis not present

## 2023-08-24 DIAGNOSIS — Z794 Long term (current) use of insulin: Secondary | ICD-10-CM | POA: Diagnosis not present

## 2023-08-24 DIAGNOSIS — I10 Essential (primary) hypertension: Secondary | ICD-10-CM

## 2023-08-24 LAB — POCT GLYCOSYLATED HEMOGLOBIN (HGB A1C): Hemoglobin A1C: 8.2 % — AB (ref 4.0–5.6)

## 2023-08-24 MED ORDER — INSULIN GLARGINE 100 UNIT/ML ~~LOC~~ SOLN: 20.0000 [IU] | Freq: Every day | SUBCUTANEOUS | 0 refills | Status: DC

## 2023-08-24 MED ORDER — METFORMIN HCL 500 MG PO TABS: 1000.0000 mg | ORAL_TABLET | Freq: Two times a day (BID) | ORAL | 0 refills | Status: DC
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
## 2023-08-24 NOTE — Patient Instructions (Signed)
 Health Maintenance Due  Topic Date Due   OPHTHALMOLOGY EXAM  05/20/2022   Lung Cancer Screening  09/24/2022   COVID-19 Vaccine (8 - 2024-25 season) 08/04/2023   DTaP/Tdap/Td (3 - Td or Tdap) 08/20/2023   Medicare Annual Wellness (AWV)  08/25/2023       01/31/2023   11:17 AM 10/18/2022   10:13 AM 09/06/2022    3:40 PM  Depression screen PHQ 2/9  Decreased Interest 0  0  Down, Depressed, Hopeless 0  0  PHQ - 2 Score 0  0  Altered sleeping 1    Tired, decreased energy 0    Change in appetite 0    Feeling bad or failure about yourself  0    Trouble concentrating 0    Moving slowly or fidgety/restless 0    Suicidal thoughts 0    PHQ-9 Score 1    Difficult doing work/chores Not difficult at all       Information is confidential and restricted. Go to Review Flowsheets to unlock data.

## 2023-08-29 ENCOUNTER — Ambulatory Visit (INDEPENDENT_AMBULATORY_CARE_PROVIDER_SITE_OTHER): Payer: Medicare PPO | Admitting: Psychology

## 2023-08-29 DIAGNOSIS — F4323 Adjustment disorder with mixed anxiety and depressed mood: Secondary | ICD-10-CM | POA: Diagnosis not present

## 2023-08-29 LAB — OPHTHALMOLOGY REPORT-SCANNED
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
## 2023-08-30 ENCOUNTER — Other Ambulatory Visit: Payer: Self-pay | Admitting: Adult Health

## 2023-08-30 MED ORDER — LANTUS SOLOSTAR 100 UNIT/ML ~~LOC~~ SOPN: 20.0000 [IU] | PEN_INJECTOR | Freq: Every day | SUBCUTANEOUS | 0 refills | Status: DC

## 2023-08-31 DIAGNOSIS — R1313 Dysphagia, pharyngeal phase: Secondary | ICD-10-CM | POA: Diagnosis not present

## 2023-08-31 DIAGNOSIS — K222 Esophageal obstruction: Secondary | ICD-10-CM | POA: Diagnosis not present

## 2023-08-31 DIAGNOSIS — Z860101 Personal history of adenomatous and serrated colon polyps: Secondary | ICD-10-CM | POA: Diagnosis not present

## 2023-08-31 DIAGNOSIS — K219 Gastro-esophageal reflux disease without esophagitis: Secondary | ICD-10-CM | POA: Diagnosis not present

## 2023-09-05 DIAGNOSIS — R350 Frequency of micturition: Secondary | ICD-10-CM | POA: Diagnosis not present

## 2023-09-05 DIAGNOSIS — N3941 Urge incontinence: Secondary | ICD-10-CM | POA: Diagnosis not present

## 2023-09-12 DIAGNOSIS — R3915 Urgency of urination: Secondary | ICD-10-CM | POA: Diagnosis not present

## 2023-09-12 DIAGNOSIS — R350 Frequency of micturition: Secondary | ICD-10-CM | POA: Diagnosis not present

## 2023-09-19 DIAGNOSIS — G4733 Obstructive sleep apnea (adult) (pediatric): Secondary | ICD-10-CM | POA: Diagnosis not present

## 2023-09-21 DIAGNOSIS — R3915 Urgency of urination: Secondary | ICD-10-CM | POA: Diagnosis not present

## 2023-09-21 DIAGNOSIS — R350 Frequency of micturition: Secondary | ICD-10-CM | POA: Diagnosis not present

## 2023-09-26 DIAGNOSIS — R350 Frequency of micturition: Secondary | ICD-10-CM | POA: Diagnosis not present

## 2023-10-05 DIAGNOSIS — R350 Frequency of micturition: Secondary | ICD-10-CM | POA: Diagnosis not present

## 2023-10-06 ENCOUNTER — Ambulatory Visit: Payer: Medicare PPO

## 2023-10-10 ENCOUNTER — Ambulatory Visit (INDEPENDENT_AMBULATORY_CARE_PROVIDER_SITE_OTHER): Admitting: Psychology

## 2023-10-10 DIAGNOSIS — F4323 Adjustment disorder with mixed anxiety and depressed mood: Secondary | ICD-10-CM

## 2023-10-10 DIAGNOSIS — N3941 Urge incontinence: Secondary | ICD-10-CM | POA: Diagnosis not present
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
## 2023-10-13 ENCOUNTER — Encounter: Payer: Self-pay | Admitting: Adult Health

## 2023-10-13 NOTE — Telephone Encounter (Signed)
**Note De-identified  [place] Obfuscation** Please advise 

## 2023-10-17 DIAGNOSIS — R350 Frequency of micturition: Secondary | ICD-10-CM | POA: Diagnosis not present

## 2023-10-24 DIAGNOSIS — M5116 Intervertebral disc disorders with radiculopathy, lumbar region: Secondary | ICD-10-CM | POA: Diagnosis not present

## 2023-10-24 DIAGNOSIS — M5416 Radiculopathy, lumbar region: Secondary | ICD-10-CM | POA: Diagnosis not present

## 2023-10-24 DIAGNOSIS — R350 Frequency of micturition: Secondary | ICD-10-CM | POA: Diagnosis not present

## 2023-10-27 ENCOUNTER — Encounter: Payer: Self-pay | Admitting: Adult Health

## 2023-10-27 MED ORDER — FREESTYLE LIBRE 3 PLUS SENSOR MISC: 0 refills | Status: DC

## 2023-10-31 DIAGNOSIS — R350 Frequency of micturition: Secondary | ICD-10-CM | POA: Diagnosis not present

## 2023-11-02 MED ORDER — FREESTYLE LIBRE 3 PLUS SENSOR MISC: 0 refills | Status: DC

## 2023-11-02 NOTE — Addendum Note (Signed)
 Addended by: Twyla Galeazzi R on: 11/02/2023 11:53 AM   Modules accepted: Orders

## 2023-11-04 ENCOUNTER — Other Ambulatory Visit: Payer: Self-pay | Admitting: Adult Health

## 2023-11-07 DIAGNOSIS — R350 Frequency of micturition: Secondary | ICD-10-CM | POA: Diagnosis not present

## 2023-11-08 ENCOUNTER — Other Ambulatory Visit: Payer: Self-pay | Admitting: Adult Health

## 2023-11-08 ENCOUNTER — Encounter: Payer: Self-pay | Admitting: Adult Health

## 2023-11-08 DIAGNOSIS — E119 Type 2 diabetes mellitus without complications: Secondary | ICD-10-CM

## 2023-11-08 MED ORDER — LANTUS SOLOSTAR 100 UNIT/ML ~~LOC~~ SOPN: 20.0000 [IU] | PEN_INJECTOR | Freq: Every day | SUBCUTANEOUS | 0 refills | Status: DC

## 2023-11-14 DIAGNOSIS — R350 Frequency of micturition: Secondary | ICD-10-CM | POA: Diagnosis not present

## 2023-11-15 DIAGNOSIS — G4733 Obstructive sleep apnea (adult) (pediatric): Secondary | ICD-10-CM | POA: Diagnosis not present

## 2023-11-15 DIAGNOSIS — E1129 Type 2 diabetes mellitus with other diabetic kidney complication: Secondary | ICD-10-CM | POA: Diagnosis not present

## 2023-11-15 DIAGNOSIS — J849 Interstitial pulmonary disease, unspecified: Secondary | ICD-10-CM | POA: Diagnosis not present

## 2023-11-15 DIAGNOSIS — J449 Chronic obstructive pulmonary disease, unspecified: Secondary | ICD-10-CM | POA: Diagnosis not present

## 2023-11-15 DIAGNOSIS — E039 Hypothyroidism, unspecified: Secondary | ICD-10-CM | POA: Diagnosis not present

## 2023-11-15 DIAGNOSIS — K222 Esophageal obstruction: Secondary | ICD-10-CM | POA: Diagnosis not present

## 2023-11-15 DIAGNOSIS — I25118 Atherosclerotic heart disease of native coronary artery with other forms of angina pectoris: Secondary | ICD-10-CM | POA: Diagnosis not present

## 2023-11-15 DIAGNOSIS — I480 Paroxysmal atrial fibrillation: Secondary | ICD-10-CM | POA: Diagnosis not present

## 2023-11-15 DIAGNOSIS — I158 Other secondary hypertension: Secondary | ICD-10-CM | POA: Diagnosis not present

## 2023-11-16 DIAGNOSIS — I499 Cardiac arrhythmia, unspecified: Secondary | ICD-10-CM | POA: Diagnosis not present

## 2023-11-20 DIAGNOSIS — I480 Paroxysmal atrial fibrillation: Secondary | ICD-10-CM | POA: Diagnosis not present

## 2023-11-21 ENCOUNTER — Ambulatory Visit: Admitting: Psychology

## 2023-11-21 DIAGNOSIS — R350 Frequency of micturition: Secondary | ICD-10-CM | POA: Diagnosis not present

## 2023-11-21 DIAGNOSIS — N3941 Urge incontinence: Secondary | ICD-10-CM | POA: Diagnosis not present

## 2023-11-24 ENCOUNTER — Encounter: Payer: Self-pay | Admitting: Adult Health

## 2023-11-24 ENCOUNTER — Ambulatory Visit: Admitting: Adult Health

## 2023-11-24 VITALS — BP 110/78 | HR 101 | Temp 98.5°F | Ht 67.0 in | Wt 193.5 lb

## 2023-11-24 DIAGNOSIS — N400 Enlarged prostate without lower urinary tract symptoms: Secondary | ICD-10-CM | POA: Diagnosis not present

## 2023-11-24 DIAGNOSIS — Z794 Long term (current) use of insulin: Secondary | ICD-10-CM | POA: Diagnosis not present

## 2023-11-24 DIAGNOSIS — Z7984 Long term (current) use of oral hypoglycemic drugs: Secondary | ICD-10-CM | POA: Diagnosis not present

## 2023-11-24 DIAGNOSIS — I10 Essential (primary) hypertension: Secondary | ICD-10-CM | POA: Diagnosis not present

## 2023-11-24 DIAGNOSIS — E119 Type 2 diabetes mellitus without complications: Secondary | ICD-10-CM

## 2023-11-24 LAB — POCT GLYCOSYLATED HEMOGLOBIN (HGB A1C): Hemoglobin A1C: 8.4 % — AB (ref 4.0–5.6)

## 2023-11-24 LAB — PSA: PSA: 1.03 ng/mL (ref 0.10–4.00)
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
## 2023-11-24 NOTE — Patient Instructions (Signed)
 Health Maintenance Due  Topic Date Due   OPHTHALMOLOGY EXAM  05/20/2022   Lung Cancer Screening  09/24/2022   COVID-19 Vaccine (8 - 2024-25 season) 08/04/2023   DTaP/Tdap/Td (3 - Td or Tdap) 08/20/2023   Medicare Annual Wellness (AWV)  08/25/2023   Diabetic kidney evaluation - Urine ACR  10/05/2023       01/31/2023   11:17 AM 10/18/2022   10:13 AM 09/06/2022    3:40 PM  Depression screen PHQ 2/9  Decreased Interest 0  0  Down, Depressed, Hopeless 0  0  PHQ - 2 Score 0  0  Altered sleeping 1    Tired, decreased energy 0    Change in appetite 0    Feeling bad or failure about yourself  0    Trouble concentrating 0    Moving slowly or fidgety/restless 0    Suicidal thoughts 0    PHQ-9 Score 1    Difficult doing work/chores Not difficult at all       Information is confidential and restricted. Go to Review Flowsheets to unlock data.

## 2023-11-28 ENCOUNTER — Ambulatory Visit: Payer: Self-pay | Admitting: Adult Health

## 2023-12-05 ENCOUNTER — Ambulatory Visit (INDEPENDENT_AMBULATORY_CARE_PROVIDER_SITE_OTHER): Admitting: Psychology

## 2023-12-05 DIAGNOSIS — F4323 Adjustment disorder with mixed anxiety and depressed mood: Secondary | ICD-10-CM | POA: Diagnosis not present
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
## 2023-12-06 ENCOUNTER — Other Ambulatory Visit: Payer: Self-pay | Admitting: Adult Health

## 2023-12-09 ENCOUNTER — Encounter: Payer: Self-pay | Admitting: Adult Health

## 2023-12-13 NOTE — Telephone Encounter (Signed)
 FYI

## 2023-12-14 DIAGNOSIS — N401 Enlarged prostate with lower urinary tract symptoms: Secondary | ICD-10-CM | POA: Diagnosis not present

## 2023-12-14 DIAGNOSIS — R350 Frequency of micturition: Secondary | ICD-10-CM | POA: Diagnosis not present

## 2023-12-14 DIAGNOSIS — N5201 Erectile dysfunction due to arterial insufficiency: Secondary | ICD-10-CM | POA: Diagnosis not present

## 2023-12-14 DIAGNOSIS — R351 Nocturia: Secondary | ICD-10-CM | POA: Diagnosis not present

## 2023-12-15 ENCOUNTER — Other Ambulatory Visit: Payer: Self-pay | Admitting: Adult Health

## 2023-12-19 DIAGNOSIS — G4733 Obstructive sleep apnea (adult) (pediatric): Secondary | ICD-10-CM | POA: Diagnosis not present

## 2024-01-02 ENCOUNTER — Ambulatory Visit (INDEPENDENT_AMBULATORY_CARE_PROVIDER_SITE_OTHER): Admitting: Psychology

## 2024-01-02 DIAGNOSIS — F4323 Adjustment disorder with mixed anxiety and depressed mood: Secondary | ICD-10-CM | POA: Diagnosis not present

## 2024-01-02 NOTE — Progress Notes (Signed)
 [facility] Counselor/Therapist Progress Note  Patient ID: [name], MRN: [id number]    Date: 01/02/24  Time Spent: 11:02  am - 12:07 pm : 65 Minutes  Treatment Type: Individual Therapy.  Reported Symptoms: depression, anxiety, and interpersonal stressors.   Mental Status Exam: Appearance:  Casual     Behavior: Appropriate  Motor: Normal  Speech/Language:  Clear and Coherent and Normal Rate  Affect: Congruent  Mood: anxious  Thought process: normal  Thought content:   WNL  Sensory/Perceptual disturbances:   WNL  Orientation: oriented to person, place, time/date, and situation  Attention: Good  Concentration: Good  Memory: WNL  Fund of knowledge:  Good  Insight:   Good  Judgment:  Good  Impulse Control: Good   Risk Assessment: Danger to Self:  No Self-injurious Behavior: No Danger to Others: No Duty to Warn:no Physical Aggression / Violence:No  Access to Firearms a concern: No  Gang Involvement:No   Subjective:   [name] participated in the session, in person in the office with the therapist, and consented to treatment [name] reviewed the events of the past week. [name] continued to process his stressors related to his step-son. He noted his wife's propensity to be a fixer and noted his wife's history of enabling her son's behavior. He noted communicating his need for distance from his step-son to his wife. He noted establishing boundaries with his wife and noted specifically boundaries regarding finances. He noted that he and his wife have tentatively agreed to this. He noted his worry that his wife believes that she positively influence her son's behavior via parenting and possibly discounting the effects of unaddressed significant mental health concerns. He noted the need of further deliniation of their agreement to include specifics such as budgets for gifts. We worked on identifying his concerns and additional boundaries, including  Nurse, adult. [name] noted his worry that his wife would react defensively to his requests. We explored this during the session. We reviewed positive and assertive communication, which therapist modeled during the session. Therapist validated [name]'s feelings and experience, encouraged [name] to further delineate boundaries and needs, and to communicate concerns during martial counseling sessions. [name] was engaged and motivated during the session. He expressed commitment towards goals. Therapist praised Zaylon and provided supportive therapy. A follow-up was scheduled for continued treatment, which he benefits from.   Interventions: CBT & interpersonal.  Diagnosis:   Adjustment disorder with mixed anxiety and depressed mood  Psychiatric Treatment: No , NA  Treatment Plan:  Client Abilities/Strengths [name] is intelligent, self-aware, and motivated for change.   Support System: Family  Client Treatment Preferences Outpatient Therapy.   Client Statement of Needs [name] would like to improve his focus and concentration, work on being less forgetful, being more mindful, develop comfort with self and personality (acceptance), manage stressors, process past events, manage overall stressors, including interpersonal, and symptoms, increase patience, reduce jumping to conclusions.   Treatment Level Biweekly  Symptoms  Anxiety: Poor focus and concentration, cognitive distortion  (jumping to conclusions), feeling on edge, mild irritability, worry. (Status: maintained)  Goals:   [name] experiences symptoms of depression and anxiety.   Treatment plan signed and available on s-drive:  No, pending signature.    Target Date: 01/03/24 Frequency: Biweekly  Progress: 25% Modality: individual    Therapist will provide referrals for additional resources as appropriate.  Therapist will provide psycho-education regarding [name]'s diagnosis and corresponding treatment approaches and  interventions. Licensed Clinical Social Worker, [place], LCSW will  support the patient's ability to achieve the goals identified. will employ CBT, BA, Problem-solving, Solution Focused, Mindfulness,  coping skills, & other evidenced-based practices will be used to promote progress towards healthy functioning to help manage decrease symptoms associated with his diagnosis.   Reduce overall level, frequency, and intensity of the feelings of depression, anxiety and panic evidenced by decreased overall symptoms from 6 to 7 days/week to 0 to 1 days/week per client report for at least 3 consecutive months. Verbally express understanding of the relationship between feelings of depression, anxiety and their impact on thinking patterns and behaviors. Verbalize an understanding of the role that distorted thinking plays in creating fears, excessive worry, and ruminations.  [name] participated in the creation of the treatment plan)    Elvie Mullet, LCSW

## 2024-01-03 ENCOUNTER — Encounter: Payer: Self-pay | Admitting: Adult Health

## 2024-01-04 ENCOUNTER — Other Ambulatory Visit: Payer: Self-pay | Admitting: Adult Health

## 2024-01-04 MED ORDER — INSULIN GLARGINE 100 UNIT/ML ~~LOC~~ SOLN: 20.0000 [IU] | Freq: Two times a day (BID) | SUBCUTANEOUS | 1 refills | Status: AC

## 2024-01-04 NOTE — Telephone Encounter (Signed)
 FYI

## 2024-01-07 ENCOUNTER — Encounter: Payer: Self-pay | Admitting: Adult Health

## 2024-01-09 ENCOUNTER — Other Ambulatory Visit: Payer: Self-pay | Admitting: Adult Health

## 2024-01-09 DIAGNOSIS — M5416 Radiculopathy, lumbar region: Secondary | ICD-10-CM | POA: Diagnosis not present

## 2024-01-09 DIAGNOSIS — M5117 Intervertebral disc disorders with radiculopathy, lumbosacral region: Secondary | ICD-10-CM | POA: Diagnosis not present

## 2024-01-09 MED ORDER — INSULIN SYRINGE 28G X 1/2" 0.5 ML MISC: 1 refills | Status: DC

## 2024-01-09 MED ORDER — INSULIN REGULAR HUMAN 100 UNIT/ML IJ SOLN: 10.0000 [IU] | Freq: Three times a day (TID) | INTRAMUSCULAR | 2 refills | Status: DC

## 2024-01-09 NOTE — Telephone Encounter (Signed)
**Note De-identified  [place] Obfuscation** Please advise 

## 2024-01-16 ENCOUNTER — Other Ambulatory Visit: Payer: Self-pay | Admitting: Adult Health

## 2024-01-16 MED ORDER — INSULIN ISOPHANE & REGULAR (HUMAN 70-30)100 UNIT/ML KWIKPEN: 10.0000 [IU] | PEN_INJECTOR | Freq: Three times a day (TID) | SUBCUTANEOUS | 3 refills | Status: DC

## 2024-01-20 ENCOUNTER — Other Ambulatory Visit: Payer: Self-pay | Admitting: Adult Health

## 2024-01-20 DIAGNOSIS — E119 Type 2 diabetes mellitus without complications: Secondary | ICD-10-CM

## 2024-01-22 ENCOUNTER — Encounter: Payer: Self-pay | Admitting: Adult Health

## 2024-01-30 NOTE — Telephone Encounter (Signed)
**Note De-identified  [place] Obfuscation** Please advise 

## 2024-01-31 DIAGNOSIS — E1129 Type 2 diabetes mellitus with other diabetic kidney complication: Secondary | ICD-10-CM | POA: Diagnosis not present

## 2024-01-31 DIAGNOSIS — R809 Proteinuria, unspecified: Secondary | ICD-10-CM | POA: Diagnosis not present

## 2024-01-31 DIAGNOSIS — E039 Hypothyroidism, unspecified: Secondary | ICD-10-CM | POA: Diagnosis not present

## 2024-02-01 ENCOUNTER — Ambulatory Visit

## 2024-02-01 ENCOUNTER — Ambulatory Visit
Admission: RE | Admit: 2024-02-01 | Discharge: 2024-02-01 | Disposition: A | Discharge status: Home / Self Care | Source: Ambulatory Visit

## 2024-02-01 ENCOUNTER — Ambulatory Visit: Payer: Self-pay

## 2024-02-01 VITALS — BP 134/79 | HR 87 | Temp 97.7°F | Resp 17

## 2024-02-01 DIAGNOSIS — M79662 Pain in left lower leg: Secondary | ICD-10-CM

## 2024-02-01 DIAGNOSIS — M62831 Muscle spasm of calf: Secondary | ICD-10-CM | POA: Diagnosis not present

## 2024-02-01 MED ORDER — BACLOFEN 10 MG PO TABS: 10.0000 mg | ORAL_TABLET | Freq: Three times a day (TID) | ORAL | 0 refills | Status: AC

## 2024-02-01 NOTE — ED Triage Notes (Addendum)
 Pt c/o LT leg pain x 2-3 weeks. Started off as a cramp in calf area. Pain worsening in last 24 hours. Says it only occurs in morning and tapers off over the day. Pain was as severe as an 8 this morning.  Hx of STEMI, on eliquis.

## 2024-02-01 NOTE — Telephone Encounter (Signed)
 Noted. FYI
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
## 2024-02-01 NOTE — Discharge Instructions (Addendum)
 Advised patient/wife may take baclofen  daily or as needed for muscle spasm/cramp of left calf muscles.  Encouraged increase daily water intake to 64 ounces per day while taking this medication.  Advised if symptoms worsen and/or unresolved please follow-up with your PCP or here for further evaluation

## 2024-02-01 NOTE — Telephone Encounter (Signed)
 FYI Only or Action Required?: FYI only for provider.  Patient was last seen in primary care on 11/24/2023 by Merna Huxley, NP.  Called Nurse Triage reporting Leg Pain.  Symptoms began several weeks ago.  Interventions attempted: Nothing.  Symptoms are: gradually worsening.  Triage Disposition: See HCP Within 4 Hours (Or PCP Triage)  Patient/caregiver understands and will follow disposition?:     Copied from CRM #[id number]. Topic: Clinical - Red Word Triage >> Feb 01, 2024 12:07 PM Rea C wrote: Red Word that prompted transfer to Nurse Triage: left lower leg pain- radiating up and worsening, feels like a muscle cramp, feel it in bed, at dawn it relieves itself but when he stands it is quite painful. Today, patient had to limp to restroom and limp to the car to go somewhere. Been ongoing every night for several weeks, but today it was worse.    Patient has history of heart issues and they are concerned of blood clot. They wanted to schedule w/ NP Huxley for next Tuesday at 7:00 and still speak with NT. Reason for Disposition  [1] Thigh or calf pain AND [2] only 1 side AND [3] present > 1 hour  (Exception: Chronic unchanged pain.)  Answer Assessment - Initial Assessment Questions Pt states the pain feels like a cramp. He wakes up early in the morning to use the restroom and feels the symptoms instantly. He has tried to change positions for relief and states symptoms worsened this morning. He states he been limping and the pain is radiating pain goes up to lower thigh. Hx of a MI in 2021 on Eliquis currently. Not taking otc meds for pain.  Patient's wife states she will take him to nearest UC to be evaluated for symptoms.     1. ONSET: When did the pain start?      2 weeks  2. LOCATION: Where is the pain located?      Just below knee to right above ankle on the back of calf area 3. PAIN: How bad is the pain?    (Scale 1-10; or mild, moderate, severe)     8/10 this morning states it  is a 1 or 2  5. CAUSE: What do you think is causing the leg pain?     Wife is concerned about a possible blood clot  6. OTHER SYMPTOMS: Do you have any other symptoms? (e.g., chest pain, back pain, breathing difficulty, swelling, rash, fever, numbness, weakness)     Denies any other symptoms.  Protocols used: Leg Pain-A-AH

## 2024-02-06 ENCOUNTER — Ambulatory Visit (INDEPENDENT_AMBULATORY_CARE_PROVIDER_SITE_OTHER): Admitting: Psychology

## 2024-02-06 DIAGNOSIS — F4323 Adjustment disorder with mixed anxiety and depressed mood: Secondary | ICD-10-CM
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
## 2024-02-16 ENCOUNTER — Encounter: Payer: Self-pay | Admitting: Adult Health

## 2024-02-21 DIAGNOSIS — J449 Chronic obstructive pulmonary disease, unspecified: Secondary | ICD-10-CM | POA: Diagnosis not present

## 2024-02-21 DIAGNOSIS — J849 Interstitial pulmonary disease, unspecified: Secondary | ICD-10-CM | POA: Diagnosis not present

## 2024-02-21 DIAGNOSIS — G4733 Obstructive sleep apnea (adult) (pediatric): Secondary | ICD-10-CM | POA: Diagnosis not present

## 2024-02-21 DIAGNOSIS — I480 Paroxysmal atrial fibrillation: Secondary | ICD-10-CM | POA: Diagnosis not present

## 2024-02-27 ENCOUNTER — Ambulatory Visit: Admitting: Adult Health

## 2024-02-27 ENCOUNTER — Encounter: Payer: Self-pay | Admitting: Adult Health

## 2024-02-27 ENCOUNTER — Ambulatory Visit (INDEPENDENT_AMBULATORY_CARE_PROVIDER_SITE_OTHER): Admitting: Psychology

## 2024-02-27 VITALS — BP 120/60 | HR 77 | Temp 97.5°F | Ht 67.0 in | Wt 196.0 lb

## 2024-02-27 DIAGNOSIS — F4323 Adjustment disorder with mixed anxiety and depressed mood: Secondary | ICD-10-CM

## 2024-02-27 DIAGNOSIS — Z0000 Encounter for general adult medical examination without abnormal findings: Secondary | ICD-10-CM

## 2024-02-27 DIAGNOSIS — N400 Enlarged prostate without lower urinary tract symptoms: Secondary | ICD-10-CM | POA: Diagnosis not present

## 2024-02-27 DIAGNOSIS — E782 Mixed hyperlipidemia: Secondary | ICD-10-CM | POA: Diagnosis not present

## 2024-02-27 DIAGNOSIS — N3281 Overactive bladder: Secondary | ICD-10-CM | POA: Diagnosis not present

## 2024-02-27 DIAGNOSIS — I10 Essential (primary) hypertension: Secondary | ICD-10-CM

## 2024-02-27 DIAGNOSIS — I480 Paroxysmal atrial fibrillation: Secondary | ICD-10-CM | POA: Diagnosis not present

## 2024-02-27 DIAGNOSIS — Z794 Long term (current) use of insulin: Secondary | ICD-10-CM

## 2024-02-27 DIAGNOSIS — Z7985 Long-term (current) use of injectable non-insulin antidiabetic drugs: Secondary | ICD-10-CM | POA: Diagnosis not present

## 2024-02-27 DIAGNOSIS — Z23 Encounter for immunization: Secondary | ICD-10-CM

## 2024-02-27 DIAGNOSIS — E039 Hypothyroidism, unspecified: Secondary | ICD-10-CM | POA: Diagnosis not present

## 2024-02-27 DIAGNOSIS — G4733 Obstructive sleep apnea (adult) (pediatric): Secondary | ICD-10-CM

## 2024-02-27 DIAGNOSIS — J849 Interstitial pulmonary disease, unspecified: Secondary | ICD-10-CM

## 2024-02-27 DIAGNOSIS — F17200 Nicotine dependence, unspecified, uncomplicated: Secondary | ICD-10-CM

## 2024-02-27 LAB — COMPREHENSIVE METABOLIC PANEL WITH GFR
ALT: 26 U/L (ref 0–53)
AST: 25 U/L (ref 0–37)
Albumin: 4.6 g/dL (ref 3.5–5.2)
Alkaline Phosphatase: 51 U/L (ref 39–117)
BUN: 13 mg/dL (ref 6–23)
CO2: 24 meq/L (ref 19–32)
Calcium: 9.8 mg/dL (ref 8.4–10.5)
Chloride: 104 meq/L (ref 96–112)
Creatinine, Ser: 0.83 mg/dL (ref 0.40–1.50)
GFR: 89.13 mL/min (ref >60.00)
Glucose, Bld: 107 mg/dL — ABNORMAL HIGH (ref 70–99)
Potassium: 3.8 meq/L (ref 3.5–5.1)
Sodium: 137 meq/L (ref 135–145)
Total Bilirubin: 2.4 mg/dL — ABNORMAL HIGH (ref 0.2–1.2)
Total Protein: 7.1 g/dL (ref 6.0–8.3)

## 2024-02-27 LAB — LIPID PANEL
Cholesterol: 102 mg/dL (ref 0–200)
HDL: 42.8 mg/dL (ref >39.00)
LDL Cholesterol: 36 mg/dL (ref 0–99)
NonHDL: 59.22
Total CHOL/HDL Ratio: 2
Triglycerides: 115 mg/dL (ref 0.0–149.0)
VLDL: 23 mg/dL (ref 0.0–40.0)

## 2024-02-27 LAB — CBC
HCT: 44.2 % (ref 39.0–52.0)
Hemoglobin: 14.8 g/dL (ref 13.0–17.0)
MCHC: 33.5 g/dL (ref 30.0–36.0)
MCV: 84.2 fl (ref 78.0–100.0)
Platelets: 296 K/uL (ref 150.0–400.0)
RBC: 5.24 Mil/uL (ref 4.22–5.81)
RDW: 16.7 % — ABNORMAL HIGH (ref 11.5–15.5)
WBC: 11.2 K/uL — ABNORMAL HIGH (ref 4.0–10.5)

## 2024-02-27 LAB — PSA: PSA: 0.73 ng/mL (ref 0.10–4.00)

## 2024-02-27 LAB — TSH: TSH: 1.76 u[IU]/mL (ref 0.35–5.50)

## 2024-02-27 NOTE — Progress Notes (Signed)
 [facility] Counselor/Therapist Progress Note  Patient ID: [name], MRN: [id number]    Date: 02/27/24  Time Spent: 10:07  am - 11:06 am : 59 Minutes  Treatment Type: Individual Therapy.  Reported Symptoms: depression, anxiety, and interpersonal stressors.   Mental Status Exam: Appearance:  Casual     Behavior: Appropriate  Motor: Normal  Speech/Language:  Clear and Coherent and Normal Rate  Affect: Congruent  Mood: anxious  Thought process: normal  Thought content:   WNL  Sensory/Perceptual disturbances:   WNL  Orientation: oriented to person, place, time/date, and situation  Attention: Good  Concentration: Good  Memory: WNL  Fund of knowledge:  Good  Insight:   Good  Judgment:  Good  Impulse Control: Good   Risk Assessment: Danger to Self:  No Self-injurious Behavior: No Danger to Others: No Duty to Warn:no Physical Aggression / Violence:No  Access to Firearms a concern: No  Gang Involvement:No   Subjective:   [name] participated in the session, in person in the office with the therapist, and consented to treatment [place] reviewed the events of the past week. [name] noted dealing with a medical issue, incontinence, and noted his various attempts to address this issue via various interventions including medication and electrical stimulation. He noted that he was offered a BOTOX medication but declined this due to the risks associated with. He noted unexpected improvement and noted feeling grateful of this. He is hopeful this will be a long-lasting improvement. He noted recent frustration regarding his wife declining a trip idea, only to suggest the same trip, noted a need for [facility] handle the planning, only to decline the trip after the plan was in place. We worked on exploring this during the session and [name] noted this being a habitual behavior and noted feelings of frustration. He noted continued frustration regarding his wife's  continued financial support despite her agreement to discontinue financial support. We worked on exploring this during the session and his own difficulty following up with his boundaries or enacting any boundaries. We worked on processing this trepidation during the session and the pros and cons of this both short-term and long-term. Therapist encouraged [name] to identify how he would like to address this specific concern, going forward. [name] was engaged and motivated during the session. He expressed commitment towards goals. Therapist validated [name]'s feelings and experience, and provided supportive therapy. A follow-up was scheduled for continued treatment, which [name] benefits from.   Interventions: CBT & interpersonal.  Diagnosis:   Adjustment disorder with mixed anxiety and depressed mood  Psychiatric Treatment: No , NA  Treatment Plan:  Client Abilities/Strengths [name] is intelligent, self-aware, and motivated for change.   Support System: Family  Client Treatment Preferences Outpatient Therapy.   Client Statement of Needs [name] would like to improve his focus and concentration, work on being less forgetful, being more mindful, develop comfort with self and personality (acceptance), manage stressors, process past events, manage overall stressors, including interpersonal, and symptoms, increase patience, reduce jumping to conclusions.   Treatment Level Biweekly  Symptoms  Anxiety: Poor focus and concentration, cognitive distortion  (jumping to conclusions), feeling on edge, mild irritability, worry. (Status: maintained)  Goals:   [name] experiences symptoms of depression and anxiety.   Treatment plan signed and available on s-drive:  No, pending signature.    Target Date: 03/05/24 Frequency: Biweekly  Progress: 25% Modality: individual    Therapist will provide referrals for additional resources as appropriate.  Therapist will provide psycho-education  regarding [name]'s  diagnosis and corresponding treatment approaches and interventions. Licensed Clinical Social Worker, [place], LCSW will support the patient's ability to achieve the goals identified. will employ CBT, BA, Problem-solving, Solution Focused, Mindfulness,  coping skills, & other evidenced-based practices will be used to promote progress towards healthy functioning to help manage decrease symptoms associated with his diagnosis.   Reduce overall level, frequency, and intensity of the feelings of depression, anxiety and panic evidenced by decreased overall symptoms from 6 to 7 days/week to 0 to 1 days/week per client report for at least 3 consecutive months. Verbally express understanding of the relationship between feelings of depression, anxiety and their impact on thinking patterns and behaviors. Verbalize an understanding of the role that distorted thinking plays in creating fears, excessive worry, and ruminations.  Alonso participated in the creation of the treatment plan)    Elvie Mullet, LCSW

## 2024-02-27 NOTE — Patient Instructions (Signed)
 It was great seeing you today   We will follow up with you regarding your lab work   Please let me know if you need anything   Someone will call you to schedule your Lung Cancer Screening
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
## 2024-02-28 ENCOUNTER — Ambulatory Visit: Payer: Self-pay | Admitting: Adult Health

## 2024-03-12 DIAGNOSIS — R809 Proteinuria, unspecified: Secondary | ICD-10-CM | POA: Diagnosis not present

## 2024-03-12 DIAGNOSIS — E1129 Type 2 diabetes mellitus with other diabetic kidney complication: Secondary | ICD-10-CM | POA: Diagnosis not present

## 2024-03-13 ENCOUNTER — Inpatient Hospital Stay
Admission: RE | Admit: 2024-03-13 | Discharge: 2024-03-13 | Disposition: A | Source: Ambulatory Visit | Attending: Adult Health

## 2024-03-13 ENCOUNTER — Other Ambulatory Visit: Payer: Self-pay | Admitting: Adult Health

## 2024-03-13 DIAGNOSIS — F17200 Nicotine dependence, unspecified, uncomplicated: Secondary | ICD-10-CM

## 2024-03-13 DIAGNOSIS — Z122 Encounter for screening for malignant neoplasm of respiratory organs: Secondary | ICD-10-CM | POA: Diagnosis not present

## 2024-03-13 DIAGNOSIS — Z87891 Personal history of nicotine dependence: Secondary | ICD-10-CM | POA: Diagnosis not present

## 2024-03-26 ENCOUNTER — Ambulatory Visit: Admitting: Psychology

## 2024-04-04 DIAGNOSIS — R809 Proteinuria, unspecified: Secondary | ICD-10-CM | POA: Diagnosis not present

## 2024-04-04 DIAGNOSIS — E1129 Type 2 diabetes mellitus with other diabetic kidney complication: Secondary | ICD-10-CM | POA: Diagnosis not present

## 2024-04-04 DIAGNOSIS — I480 Paroxysmal atrial fibrillation: Secondary | ICD-10-CM | POA: Diagnosis not present

## 2024-04-04 DIAGNOSIS — I10 Essential (primary) hypertension: Secondary | ICD-10-CM | POA: Diagnosis not present

## 2024-04-08 ENCOUNTER — Encounter: Payer: Self-pay | Admitting: Radiology

## 2024-04-16 ENCOUNTER — Ambulatory Visit (INDEPENDENT_AMBULATORY_CARE_PROVIDER_SITE_OTHER): Admitting: Psychology

## 2024-04-16 DIAGNOSIS — F4323 Adjustment disorder with mixed anxiety and depressed mood: Secondary | ICD-10-CM | POA: Diagnosis not present

## 2024-04-16 DIAGNOSIS — M5416 Radiculopathy, lumbar region: Secondary | ICD-10-CM | POA: Diagnosis not present

## 2024-04-16 DIAGNOSIS — M5117 Intervertebral disc disorders with radiculopathy, lumbosacral region: Secondary | ICD-10-CM | POA: Diagnosis not present
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
## 2024-05-06 ENCOUNTER — Other Ambulatory Visit: Payer: Self-pay | Admitting: Adult Health

## 2024-05-13 DIAGNOSIS — E1129 Type 2 diabetes mellitus with other diabetic kidney complication: Secondary | ICD-10-CM | POA: Diagnosis not present

## 2024-05-13 DIAGNOSIS — I10 Essential (primary) hypertension: Secondary | ICD-10-CM | POA: Diagnosis not present

## 2024-05-13 DIAGNOSIS — R809 Proteinuria, unspecified: Secondary | ICD-10-CM | POA: Diagnosis not present

## 2024-05-14 ENCOUNTER — Ambulatory Visit: Admitting: Psychology

## 2024-05-14 DIAGNOSIS — F4323 Adjustment disorder with mixed anxiety and depressed mood: Secondary | ICD-10-CM

## 2024-05-14 NOTE — Progress Notes (Signed)
 [facility] Counselor/Therapist Progress Note  Patient ID: [name], MRN: [id number]    Date: 05/14/24  Time Spent: 11:06  am - 12:06 pm : 60 Minutes  Treatment Type: Individual Therapy.  Reported Symptoms: depression and anxiety.   Mental Status Exam: Appearance:  Casual     Behavior: Appropriate  Motor: Normal  Speech/Language:  Clear and Coherent  Affect: Congruent  Mood: dysthymic  Thought process: normal  Thought content:   WNL  Sensory/Perceptual disturbances:   WNL  Orientation: oriented to person, place, time/date, and situation  Attention: Good  Concentration: Good  Memory: WNL  Fund of knowledge:  Good  Insight:   Good  Judgment:  Good  Impulse Control: Good   Risk Assessment: Danger to Self:  No Self-injurious Behavior: No Danger to Others: No Duty to Warn:no Physical Aggression / Violence:No  Access to Firearms a concern: No  Gang Involvement:No   Subjective:   [name] participated in the session, in person in the office with the therapist, and consented to treatment [name] reviewed the events of the past week.   He further delineated the most recent disagreement with his wife regarding fiances and his wife asking his opinion about a large purchase for her grand-daughter only to ignore his opinion and making the purchase none the less. He noted that he and his wife experienced a loss of ~10k as a result of this endeavor. He noted setting boundaries with his wife, noting that he wouldn't be involved in in the clean up.  He noted maintaining these boundaries and not engaging in necessary steps.   We reviewed numerous treatment approaches including CBT, BA, Problem Solving, and Solution focused therapy. Psych-education regarding the [name]'s diagnosis of Adjustment disorder with mixed anxiety and depressed mood was provided during the session. We discussed [name]'s goals treatment goals which include get a  better understanding of self from the standpoint of relationships with others, maintaining good attitude on maintaining good health, continuing to crystallize a good understanding of who I am and my purposes for life. He also noted wanting to process his anger and frustration towards family and how to cope with this going forward. [name] provided verbal approval of the treatment plan.    Interventions: Psycho-education & Goal Setting.   Diagnosis:   Adjustment disorder with mixed anxiety and depressed mood  Psychiatric Treatment: No , NA  John Mullet, LCSW     Behavioral Health Treatment Plan    Name: [name]     MRN: [id number]     Treatment Plan Development Date: 05/14/24     Strengths: Supportive Relationships, Family, Hopefulness, Self Advocate, and Able to Communicate Effectively   Supports: Spouse and Friends     Theatre Manager of Needs: [name] would like to work on get a better understanding of self from the standpoint of relationships with others, maintaining good attitude on maintaining good health, continuing to crystallize a good understanding of who I am and my purposes for life.      Treatment Level: Individual Therapy   Client Treatment Preferences: Outpatient Therapy.      Diagnosis: Adjustment Disorder   Adjustment disorder with mixed anxiety and depressed mood   Symptoms:   The development of emotional or behavioral symptoms in response to an identifiable stressor(s) occurring within 3 months of the onset of the stressor(s)., These symptoms or behaviors are clinically significant, as evidenced by one or both of the following:Marked distress that is  out of proportion to the severity or intensity of the stressor, considering the external context and the cultural factors that might influence symptom severity and presentation., Significant impairment in social, occupational, or other important areas of functioning., The  stress-related disturbance does not meet the criteria for another mental disorder and is not merely an exacerbation of a preexisting mental disorder., The symptoms do not represent normal bereavement., and Once the stressor (or its consequences) has terminated, the symptoms do not persist for more than an additional 6 months., and Specify whether:309.28 (F43.23) With mixed anxiety and depressed mood: A combination of depression and anxiety is predominant.   Goals:   Return to previous functioning and reduce or eliminate symptoms., Reduce feelings of sadness and hopelessness thereby improving mood., Reduce anxiety and stress using cognitive and behavioral techniques to manage symptoms., and Improve coping skills by learning effective ways to adapt and manage anxiety.   Objectives: Target Date For All Objectives: 05/14/25   Interrupt negative thinking and increase positive thoughts and feelings, Build resilience and learn to adapt to stress and hardships., Practice mindfulness, and Live a healthy lifestyle   Progress Documentation:    Progressing   Interventions:   Cognitive Behavioral Therapy, Assertiveness/Communication, Roleplay, and Interpersonal     Expected duration of treatment: Evaluate after 1 year of treatment   Party responsible for implementation of interventions: The patient, [name] and Therapist, John Mullet, LCSW.   This plan has been reviewed and created by the following participants: The patient, [name] and Therapist, John Mullet, LCSW.   This plan will be reviewed at least every 12 months.   Status of Treatment Plan Signature:  No, pending signature via MyChart.   Signature: John Mullet, LCSW

## 2024-05-14 NOTE — Progress Notes (Signed)
 Behavioral Health Treatment Plan   Name:[name]   MRN: [id number]   Treatment Plan Development Date: 05/14/24   Strengths: Supportive Relationships, Family, Hopefulness, Self Advocate, and Able to Communicate Effectively  Supports: Spouse and Friends   Theatre Manager of Needs: [name] would like to work on get a better understanding of self from the standpoint of relationships with others, maintaining good attitude on maintaining good health, continuing to crystallize a good understanding of who I am and my purposes for life.    Treatment Level: Individual Therapy  Client Treatment Preferences: Outpatient Therapy.    Diagnosis: Adjustment Disorder  Adjustment disorder with mixed anxiety and depressed mood  Symptoms:  The development of emotional or behavioral symptoms in response to an identifiable stressor(s) occurring within 3 months of the onset of the stressor(s)., These symptoms or behaviors are clinically significant, as evidenced by one or both of the following:Marked distress that is out of proportion to the severity or intensity of the stressor, considering the external context and the cultural factors that might influence symptom severity and presentation., Significant impairment in social, occupational, or other important areas of functioning., The stress-related disturbance does not meet the criteria for another mental disorder and is not merely an exacerbation of a preexisting mental disorder., The symptoms do not represent normal bereavement., and Once the stressor (or its consequences) has terminated, the symptoms do not persist for more than an additional 6 months., and Specify whether:309.28 (F43.23) With mixed anxiety and depressed mood: A combination of depression and anxiety is predominant.  Goals:  Return to previous functioning and reduce or eliminate symptoms., Reduce feelings of sadness and hopelessness thereby improving mood., Reduce anxiety  and stress using cognitive and behavioral techniques to manage symptoms., and Improve coping skills by learning effective ways to adapt and manage anxiety.  Objectives: Target Date For All Objectives: 05/14/25  Interrupt negative thinking and increase positive thoughts and feelings, Build resilience and learn to adapt to stress and hardships., Practice mindfulness, and Live a healthy lifestyle  Progress Documentation:   Progressing  Interventions:  Cognitive Behavioral Therapy, Assertiveness/Communication, Roleplay, and Interpersonal     Expected duration of treatment: Evaluate after 1 year of treatment  Party responsible for implementation of interventions: The patient, [name] and Therapist, Elvie Mullet, LCSW.  This plan has been reviewed and created by the following participants: The patient, [name] and Therapist, Elvie Mullet, LCSW.  This plan will be reviewed at least every 12 months.  Status of Treatment Plan Signature:  No, pending signature via MyChart.  Signature: Elvie Mullet, LCSW

## 2024-05-26 ENCOUNTER — Other Ambulatory Visit: Payer: Self-pay | Admitting: Adult Health

## 2024-06-18 ENCOUNTER — Ambulatory Visit: Admitting: Psychology

## 2024-06-18 ENCOUNTER — Encounter: Payer: Self-pay | Admitting: Psychology

## 2024-06-18 DIAGNOSIS — F4323 Adjustment disorder with mixed anxiety and depressed mood: Secondary | ICD-10-CM | POA: Diagnosis not present

## 2024-06-18 NOTE — Progress Notes (Signed)
 [facility] Counselor/Therapist Progress Note  Patient ID: [name], MRN: [id number]    Date: 06/18/2024  Time Spent: 9:09  am - 10:08 am : 59 Minutes  Treatment Type: Individual Therapy.  Reported Symptoms: depression and anxiety.   Mental Status Exam: Appearance:  Casual     Behavior: Appropriate  Motor: Normal  Speech/Language:  Clear and Coherent  Affect: Congruent  Mood: dysthymic  Thought process: normal  Thought content:   WNL  Sensory/Perceptual disturbances:   WNL  Orientation: oriented to person, place, time/date, and situation  Attention: Good  Concentration: Good  Memory: WNL  Fund of knowledge:  Good  Insight:   Good  Judgment:  Good  Impulse Control: Good   Risk Assessment: Danger to Self:  No Self-injurious Behavior: No Danger to Others: No Duty to Warn:no Physical Aggression / Violence:No  Access to Firearms a concern: No  Gang Involvement:No   Subjective:   [name] participated in the session, in person in the office with the therapist, and consented to treatment [name] reviewed the events of the past week. [name] noted continued financial stressors and noted telling his wife We are losing our retirement in reference in his step-children who she has continued to support financially despite [name] strenuous objection. [name] noted his wife's recent request to have a joint 68th birthday celebration which [name] was receptive to. However, he noted that his wife has requested that her son, [name], is invited. [name] stated that he said no and referenced previous boundary of not having [name] be in their home again. He noted discussing this in couple's counseling and was provided feedback from the therapist. He noted that the therapist stated that [name] has not provided a roadmap for [name] to lead to reconciliation. [name] noted working on identifying what would be needed, from [name], to rebuild the relationship. [name] noted journaling on this topic  and read his writing during the session.  [name] noted a need for both financial repayment and character changes to be a man of honor and take responsibility. We worked on identifying the viability of this goals in regards to his step-son having the ability, interest, and drive to address these concerns. Therapist highlighted the importance to present information in  positive, assertive, and digestible manner. Therapist reviewed communication during the session. Therapist highlighted encouraged [name] to identify the repercussions for boundaries that are not adhered to. Therapist highlighted the importance of [name] and his wife both holding [name] accountable. [name] noted his wife's consistent difficulty to set, maintain boundaries, and hold her son accountable. Therapist encouraged [name] to work on identifying feelings and needs and bring-up his needs and concerns in couple's counseling. [name] was engaged and receptive to this during the session. He expressed commitment towards goals. Therapist praised [name] for his effort and provided supportive therapy. A follow-up was schedule for continued treatment, which he benefits from.    Interventions: CBT & interpersonal.   Diagnosis:   Adjustment disorder with mixed anxiety and depressed mood  Psychiatric Treatment: No , NA  [name], [name]     Behavioral Health Treatment Plan    Name: [name]     MRN: [id number]     Treatment Plan Development Date: 05/14/24     Strengths: Supportive Relationships, Family, Hopefulness, Self Advocate, and Able to Communicate Effectively   Supports: Spouse and Friends     Theatre Manager of Needs: [name] would like to work on get a better understanding of self from the  standpoint of relationships with others, maintaining good attitude on maintaining good health, continuing to crystallize a good understanding of who I am and my purposes for life.      Treatment Level: Individual Therapy    Client Treatment Preferences: Outpatient Therapy.      Diagnosis: Adjustment Disorder   Adjustment disorder with mixed anxiety and depressed mood   Symptoms:   The development of emotional or behavioral symptoms in response to an identifiable stressor(s) occurring within 3 months of the onset of the stressor(s)., These symptoms or behaviors are clinically significant, as evidenced by one or both of the following:Marked distress that is out of proportion to the severity or intensity of the stressor, considering the external context and the cultural factors that might influence symptom severity and presentation., Significant impairment in social, occupational, or other important areas of functioning., The stress-related disturbance does not meet the criteria for another mental disorder and is not merely an exacerbation of a preexisting mental disorder., The symptoms do not represent normal bereavement., and Once the stressor (or its consequences) has terminated, the symptoms do not persist for more than an additional 6 months., and Specify whether:309.28 (F43.23) With mixed anxiety and depressed mood: A combination of depression and anxiety is predominant.   Goals:   Return to previous functioning and reduce or eliminate symptoms., Reduce feelings of sadness and hopelessness thereby improving mood., Reduce anxiety and stress using cognitive and behavioral techniques to manage symptoms., and Improve coping skills by learning effective ways to adapt and manage anxiety.   Objectives: Target Date For All Objectives: 05/14/25   Interrupt negative thinking and increase positive thoughts and feelings, Build resilience and learn to adapt to stress and hardships., Practice mindfulness, and Live a healthy lifestyle   Progress Documentation:    Progressing   Interventions:   Cognitive Behavioral Therapy, Assertiveness/Communication, Roleplay, and Interpersonal     Expected duration of treatment:  Evaluate after 1 year of treatment   Party responsible for implementation of interventions: The patient, Brylon Brenning and Therapist, [name], [name].   This plan has been reviewed and created by the following participants: The patient, Crandall Harvel and Therapist, [name], [name].   This plan will be reviewed at least every 12 months.   Status of Treatment Plan Signature:  No, pending signature via MyChart.  Resent on 06/18/24.   Signature: [name], [name]

## 2024-07-02 ENCOUNTER — Ambulatory Visit: Admitting: Psychology

## 2024-07-23 ENCOUNTER — Ambulatory Visit: Admitting: Psychology

## 2024-08-23 ENCOUNTER — Ambulatory Visit

## 2025-02-28 ENCOUNTER — Encounter: Admitting: Adult Health

## 2025-02-28 ENCOUNTER — Ambulatory Visit: Admitting: Adult Health
